# Patient Record
Sex: Female | Born: 1935 | Race: White | Hispanic: No | Smoking: Former smoker
Health system: Southern US, Community
[De-identification: ages and names within clinical notes are randomized; demographics above are authoritative.]

## PROBLEM LIST (undated history)

## (undated) DIAGNOSIS — K5790 Diverticulosis of intestine, part unspecified, without perforation or abscess without bleeding: Secondary | ICD-10-CM

## (undated) DIAGNOSIS — D369 Benign neoplasm, unspecified site: Secondary | ICD-10-CM

## (undated) DIAGNOSIS — Z1211 Encounter for screening for malignant neoplasm of colon: Secondary | ICD-10-CM

## (undated) DIAGNOSIS — K311 Adult hypertrophic pyloric stenosis: Secondary | ICD-10-CM

## (undated) DIAGNOSIS — I10 Essential (primary) hypertension: Secondary | ICD-10-CM

## (undated) DIAGNOSIS — C539 Malignant neoplasm of cervix uteri, unspecified: Secondary | ICD-10-CM

## (undated) DIAGNOSIS — I639 Cerebral infarction, unspecified: Secondary | ICD-10-CM

## (undated) DIAGNOSIS — M1990 Unspecified osteoarthritis, unspecified site: Secondary | ICD-10-CM

## (undated) DIAGNOSIS — M25473 Effusion, unspecified ankle: Secondary | ICD-10-CM

## (undated) DIAGNOSIS — R7989 Other specified abnormal findings of blood chemistry: Secondary | ICD-10-CM

## (undated) DIAGNOSIS — Z87440 Personal history of urinary (tract) infections: Secondary | ICD-10-CM

## (undated) DIAGNOSIS — R609 Edema, unspecified: Secondary | ICD-10-CM

## (undated) DIAGNOSIS — Z0389 Encounter for observation for other suspected diseases and conditions ruled out: Secondary | ICD-10-CM

## (undated) DIAGNOSIS — K219 Gastro-esophageal reflux disease without esophagitis: Secondary | ICD-10-CM

## (undated) DIAGNOSIS — H3530 Unspecified macular degeneration: Secondary | ICD-10-CM

## (undated) DIAGNOSIS — K279 Peptic ulcer, site unspecified, unspecified as acute or chronic, without hemorrhage or perforation: Secondary | ICD-10-CM

## (undated) DIAGNOSIS — Z90710 Acquired absence of both cervix and uterus: Secondary | ICD-10-CM

## (undated) DIAGNOSIS — M25476 Effusion, unspecified foot: Secondary | ICD-10-CM

## (undated) HISTORY — DX: Essential (primary) hypertension: I10

## (undated) HISTORY — DX: Edema, unspecified: R60.9

## (undated) HISTORY — DX: Encounter for screening for malignant neoplasm of colon: Z12.11

## (undated) HISTORY — DX: Unspecified osteoarthritis, unspecified site: M19.90

## (undated) HISTORY — DX: Unspecified macular degeneration: H35.30

## (undated) HISTORY — DX: Encounter for observation for other suspected diseases and conditions ruled out: Z03.89

## (undated) HISTORY — DX: Benign neoplasm, unspecified site: D36.9

## (undated) HISTORY — DX: Effusion, unspecified foot: M25.476

## (undated) HISTORY — DX: Cerebral infarction, unspecified: I63.9

## (undated) HISTORY — DX: Personal history of urinary (tract) infections: Z87.440

## (undated) HISTORY — DX: Peptic ulcer, site unspecified, unspecified as acute or chronic, without hemorrhage or perforation: K27.9

## (undated) HISTORY — DX: Effusion, unspecified ankle: M25.473

## (undated) HISTORY — PX: ABDOMINAL HYSTERECTOMY: SHX81

## (undated) HISTORY — DX: Malignant neoplasm of cervix uteri, unspecified: C53.9

## (undated) HISTORY — DX: Diverticulosis of intestine, part unspecified, without perforation or abscess without bleeding: K57.90

## (undated) HISTORY — DX: Acquired absence of both cervix and uterus: Z90.710

## (undated) HISTORY — DX: Adult hypertrophic pyloric stenosis: K31.1

## (undated) HISTORY — DX: Gastro-esophageal reflux disease without esophagitis: K21.9

## (undated) HISTORY — DX: Other specified abnormal findings of blood chemistry: R79.89

## (undated) HISTORY — PX: OTHER SURGICAL HISTORY: SHX169

---

## 1992-04-03 DIAGNOSIS — Q400 Congenital hypertrophic pyloric stenosis: Secondary | ICD-10-CM

## 1998-09-15 ENCOUNTER — Other Ambulatory Visit: Admission: RE | Admit: 1998-09-15 | Discharge: 1998-09-15 | Payer: Self-pay | Admitting: Orthopedic Surgery

## 1999-01-25 ENCOUNTER — Other Ambulatory Visit: Admission: RE | Admit: 1999-01-25 | Discharge: 1999-01-25 | Payer: Self-pay | Admitting: Orthopedic Surgery

## 2003-12-01 ENCOUNTER — Encounter (INDEPENDENT_AMBULATORY_CARE_PROVIDER_SITE_OTHER): Payer: Self-pay | Admitting: Gastroenterology

## 2004-12-19 ENCOUNTER — Ambulatory Visit: Payer: Self-pay | Admitting: Internal Medicine

## 2005-01-16 ENCOUNTER — Ambulatory Visit: Payer: Self-pay | Admitting: Internal Medicine

## 2005-01-23 ENCOUNTER — Ambulatory Visit: Payer: Self-pay | Admitting: Internal Medicine

## 2005-02-19 ENCOUNTER — Ambulatory Visit: Payer: Self-pay | Admitting: Internal Medicine

## 2005-09-09 ENCOUNTER — Ambulatory Visit: Payer: Self-pay | Admitting: Internal Medicine

## 2005-09-18 ENCOUNTER — Inpatient Hospital Stay (HOSPITAL_COMMUNITY): Admission: EM | Admit: 2005-09-18 | Discharge: 2005-09-20 | Payer: Self-pay | Admitting: Emergency Medicine

## 2005-09-19 ENCOUNTER — Ambulatory Visit: Payer: Self-pay | Admitting: Internal Medicine

## 2005-09-23 ENCOUNTER — Ambulatory Visit: Payer: Self-pay

## 2005-10-10 ENCOUNTER — Ambulatory Visit: Payer: Self-pay | Admitting: Internal Medicine

## 2006-01-13 ENCOUNTER — Ambulatory Visit: Payer: Self-pay | Admitting: Internal Medicine

## 2006-02-26 ENCOUNTER — Ambulatory Visit: Payer: Self-pay | Admitting: Internal Medicine

## 2006-03-11 HISTORY — PX: CATARACT EXTRACTION: SUR2

## 2006-03-20 ENCOUNTER — Ambulatory Visit (HOSPITAL_COMMUNITY): Admission: RE | Admit: 2006-03-20 | Discharge: 2006-03-20 | Payer: Self-pay | Admitting: Internal Medicine

## 2006-03-20 ENCOUNTER — Ambulatory Visit: Payer: Self-pay | Admitting: Internal Medicine

## 2006-04-22 ENCOUNTER — Ambulatory Visit: Payer: Self-pay | Admitting: Internal Medicine

## 2006-12-02 ENCOUNTER — Encounter: Payer: Self-pay | Admitting: *Deleted

## 2006-12-02 DIAGNOSIS — Z9079 Acquired absence of other genital organ(s): Secondary | ICD-10-CM | POA: Insufficient documentation

## 2006-12-02 DIAGNOSIS — K279 Peptic ulcer, site unspecified, unspecified as acute or chronic, without hemorrhage or perforation: Secondary | ICD-10-CM

## 2006-12-02 DIAGNOSIS — Z8679 Personal history of other diseases of the circulatory system: Secondary | ICD-10-CM | POA: Insufficient documentation

## 2006-12-02 DIAGNOSIS — I10 Essential (primary) hypertension: Secondary | ICD-10-CM | POA: Insufficient documentation

## 2006-12-02 DIAGNOSIS — K219 Gastro-esophageal reflux disease without esophagitis: Secondary | ICD-10-CM

## 2007-03-20 ENCOUNTER — Encounter: Payer: Self-pay | Admitting: Internal Medicine

## 2007-06-05 ENCOUNTER — Ambulatory Visit: Payer: Self-pay | Admitting: Internal Medicine

## 2007-06-05 DIAGNOSIS — R7989 Other specified abnormal findings of blood chemistry: Secondary | ICD-10-CM | POA: Insufficient documentation

## 2007-06-08 ENCOUNTER — Encounter: Payer: Self-pay | Admitting: Internal Medicine

## 2007-10-23 ENCOUNTER — Telehealth: Payer: Self-pay | Admitting: Internal Medicine

## 2007-11-09 ENCOUNTER — Encounter: Payer: Self-pay | Admitting: Internal Medicine

## 2007-11-24 ENCOUNTER — Telehealth: Payer: Self-pay | Admitting: Internal Medicine

## 2007-12-10 ENCOUNTER — Telehealth: Payer: Self-pay | Admitting: Internal Medicine

## 2008-06-07 ENCOUNTER — Telehealth: Payer: Self-pay | Admitting: Internal Medicine

## 2008-07-05 ENCOUNTER — Encounter: Payer: Self-pay | Admitting: Internal Medicine

## 2008-08-04 ENCOUNTER — Ambulatory Visit: Payer: Self-pay | Admitting: Internal Medicine

## 2008-08-04 DIAGNOSIS — M25473 Effusion, unspecified ankle: Secondary | ICD-10-CM | POA: Insufficient documentation

## 2008-08-04 DIAGNOSIS — R609 Edema, unspecified: Secondary | ICD-10-CM | POA: Insufficient documentation

## 2008-08-04 DIAGNOSIS — M25476 Effusion, unspecified foot: Secondary | ICD-10-CM

## 2008-08-04 DIAGNOSIS — N390 Urinary tract infection, site not specified: Secondary | ICD-10-CM

## 2008-08-04 LAB — CONVERTED CEMR LAB
ALT: 17 units/L (ref 0–35)
AST: 19 units/L (ref 0–37)
Basophils Absolute: 0 10*3/uL (ref 0.0–0.1)
Bilirubin Urine: NEGATIVE
Calcium: 9.1 mg/dL (ref 8.4–10.5)
Creatinine, Ser: 0.8 mg/dL (ref 0.4–1.2)
Eosinophils Absolute: 0.2 10*3/uL (ref 0.0–0.7)
Eosinophils Relative: 2.8 % (ref 0.0–5.0)
GFR calc non Af Amer: 72.73 mL/min (ref >60)
Lymphocytes Relative: 23.8 % (ref 12.0–46.0)
MCHC: 34.5 g/dL (ref 30.0–36.0)
Neutrophils Relative %: 64.7 % (ref 43.0–77.0)
Platelets: 313 10*3/uL (ref 150.0–400.0)
Potassium: 4.1 meq/L (ref 3.5–5.1)
RDW: 12.1 % (ref 11.5–14.6)
Specific Gravity, Urine: 1.015 (ref 1.000–1.030)
Total Protein: 7.8 g/dL (ref 6.0–8.3)
Urobilinogen, UA: 0.2 (ref 0.0–1.0)
WBC: 8.4 10*3/uL (ref 4.5–10.5)
pH: 6 (ref 5.0–8.0)

## 2008-08-05 ENCOUNTER — Encounter: Payer: Self-pay | Admitting: Internal Medicine

## 2008-08-05 ENCOUNTER — Telehealth: Payer: Self-pay | Admitting: Internal Medicine

## 2008-08-11 ENCOUNTER — Encounter: Admission: RE | Admit: 2008-08-11 | Discharge: 2008-09-13 | Payer: Self-pay | Admitting: Internal Medicine

## 2008-09-13 ENCOUNTER — Ambulatory Visit: Payer: Self-pay | Admitting: Internal Medicine

## 2008-10-31 ENCOUNTER — Encounter (INDEPENDENT_AMBULATORY_CARE_PROVIDER_SITE_OTHER): Payer: Self-pay | Admitting: *Deleted

## 2008-12-05 DIAGNOSIS — Z860100 Personal history of colon polyps, unspecified: Secondary | ICD-10-CM | POA: Insufficient documentation

## 2008-12-06 ENCOUNTER — Ambulatory Visit: Payer: Self-pay | Admitting: Gastroenterology

## 2008-12-12 ENCOUNTER — Telehealth: Payer: Self-pay | Admitting: Internal Medicine

## 2008-12-16 ENCOUNTER — Ambulatory Visit: Payer: Self-pay | Admitting: Gastroenterology

## 2008-12-16 ENCOUNTER — Encounter: Payer: Self-pay | Admitting: Gastroenterology

## 2008-12-20 ENCOUNTER — Encounter: Payer: Self-pay | Admitting: Gastroenterology

## 2009-07-18 ENCOUNTER — Telehealth: Payer: Self-pay | Admitting: Internal Medicine

## 2009-10-06 ENCOUNTER — Telehealth: Payer: Self-pay | Admitting: Internal Medicine

## 2010-02-19 ENCOUNTER — Telehealth: Payer: Self-pay | Admitting: Internal Medicine

## 2010-03-20 ENCOUNTER — Telehealth: Payer: Self-pay | Admitting: Internal Medicine

## 2010-03-21 ENCOUNTER — Ambulatory Visit
Admission: RE | Admit: 2010-03-21 | Discharge: 2010-03-21 | Payer: Self-pay | Source: Home / Self Care | Attending: Internal Medicine | Admitting: Internal Medicine

## 2010-04-08 LAB — CONVERTED CEMR LAB
Basophils Absolute: 0 10*3/uL (ref 0.0–0.1)
CO2: 32 meq/L (ref 19–32)
Calcium: 9.3 mg/dL (ref 8.4–10.5)
Direct LDL: 113.8 mg/dL
Eosinophils Absolute: 0.2 10*3/uL (ref 0.0–0.7)
Eosinophils Relative: 2.7 % (ref 0.0–5.0)
GFR calc Af Amer: 68 mL/min
HCT: 42.3 % (ref 36.0–46.0)
Lymphocytes Relative: 29 % (ref 12.0–46.0)
MCHC: 33.2 g/dL (ref 30.0–36.0)
MCV: 89.6 fL (ref 78.0–100.0)
Monocytes Absolute: 0.6 10*3/uL (ref 0.1–1.0)
Monocytes Relative: 8.9 % (ref 3.0–12.0)
Neutro Abs: 3.7 10*3/uL (ref 1.4–7.7)
Potassium: 4.9 meq/L (ref 3.5–5.1)
Total CHOL/HDL Ratio: 2.9
Triglycerides: 116 mg/dL (ref 0–149)
WBC: 6.3 10*3/uL (ref 4.5–10.5)

## 2010-04-12 NOTE — Assessment & Plan Note (Signed)
Summary: pt has c/o dizziness with ears feeling clogged   Vital Signs:  Patient profile:   77 year old female Height:      66 inches Weight:      177 pounds BMI:     28.67 Temp:     98.2 degrees F oral Pulse rate:   64 / minute BP sitting:   148 / 82  (left arm) Cuff size:   regular  Vitals Entered By: Lamar Sprinkles, CMA (March 21, 2010 9:32 AM) CC: dizzyness - room spinning 2 nights ago when she went to bed, somewhat better yesterday, ? ears need to be cleaned out/SD   Primary Care Provider:  Jacques Navy MD  CC:  dizzyness - room spinning 2 nights ago when she went to bed, somewhat better yesterday, and ? ears need to be cleaned out/SD.  History of Present Illness: Had an episode of dizziness when she went to bed the last two nights. She could not gether eyes to focus and get the room spinning to stop. Jhonnie Garner she got to go to the bathroom - she felt off balance. The sympotms have improved. She did have a dull headache. She is concerned that she may have an increase in cerumen.   In mid-December she had a 24 GI illness: felt queasy and then had severe nausea followed by dry heaves and then diarrhea. She continued to have N/V all day and all night and then eased off Monday. Last week she did have a little trouble after something she ate.   She denies chest pain, no increased SOB,  no focal weakness, no paresthesia, no cognitive change or speech trouble, no swallowing problem. She is having increased pain in her knees.   Current Medications (verified): 1)  Dulcolax Stool Softener 100 Mg  Caps (Docusate Sodium) .... Take 2 Tab By Mouth At Bedtime 2)  Centrum Silver   Tabs (Multiple Vitamins-Minerals) .... Take One Tablet Once Daily 3)  Dilt-Cd 180 Mg Xr24h-Cap (Diltiazem Hcl Coated Beads) .Marland Kitchen.. 1 By Mouth Once Daily 4)  Plavix 75 Mg  Tabs (Clopidogrel Bisulfate) .... Take One Tablet Once Daily 5)  Aspirin 81 Mg  Tabs (Aspirin) .... Take One Tablet Once Daily 6)  Prilosec Otc 20 Mg   Tbec (Omeprazole Magnesium) .... Take One Tablet Once Daily 7)  Aleve 220 Mg Tabs (Naproxen Sodium) .... As Needed Knee Pain  Allergies (verified): No Known Drug Allergies  Past History:  Past Medical History: Last updated: 12/05/2008 COLONIC POLYPS, ADENOMATOUS, HX OF (ICD-V12.72) PYLORIC STENOSIS (ICD-750.5) COLONIC POLYPS, ADENOMATOUS (ICD-211.3) UTI (ICD-599.0) EDEMA (ICD-782.3) EFFUSION OF ANKLE AND FOOT JOINT (ICD-719.07) SPECIAL SCREENING FOR MALIGNANT NEOPLASMS COLON (ICD-V76.51) OBSERVATION FOR SUSPECTED CARDIOVASCULAR DISEASE (ICD-V71.7) OTHER ABNORMAL BLOOD CHEMISTRY (ICD-790.6) * CYST ON RIGHT HAND REMOVED CEREBROVASCULAR ACCIDENT, HX OF (ICD-V12.50) TOTAL ABDOMINAL HYSTERECTOMY, HX OF (ICD-V45.77) HYPERTENSION (ICD-401.9) PEPTIC ULCER DISEASE (ICD-533.90) GERD (ICD-530.81)  Past Surgical History: Last updated: 06/05/2007 * CYST ON RIGHT HAND REMOVED TOTAL ABDOMINAL HYSTERECTOMY, HX OF (ICD-V45.77)-cervical cancer G0P0 Cataract extraction '08 left with IOL Dagoberto Ligas) FH reviewed for relevance, SH/Risk Factors reviewed for relevance  Review of Systems  The patient denies anorexia, fever, weight loss, weight gain, chest pain, syncope, dyspnea on exertion, prolonged cough, abdominal pain, severe indigestion/heartburn, suspicious skin lesions, depression, enlarged lymph nodes, and angioedema.    Physical Exam  General:  alert, well-developed, well-nourished, and well-hydrated.   Head:  normocephalic and atraumatic.   Eyes:  pupils equal and pupils round.   Ears:  cerumen  bilaterally - irrigated and using a soft curette removed blocks of cerumen from each ear.  Neck:  supple and no carotid bruits.   Chest Wall:  no deformities.   Lungs:  normal respiratory effort, normal breath sounds, no crackles, and no wheezes.   Heart:  normal rate, regular rhythm, no murmur, and no JVD.   Msk:  normal ROM, no joint tenderness, and no joint swelling.   Pulses:  2+ RADIAL   Extremities:  No clubbing, cyanosis, edema, or deformity noted with normal full range of motion of all joints.   Neurologic:  alert & oriented X3, cranial nerves II-XII intact, strength normal in all extremities, gait normal, DTRs symmetrical and normal, finger-to-nose normal, and heel-to-shin normal.  Normal rapid finger movement Skin:  turgor normal, color normal, no rashes, and no suspicious lesions.   Cervical Nodes:  no anterior cervical adenopathy and no posterior cervical adenopathy.   Psych:  Oriented X3, memory intact for recent and remote, normally interactive, and not anxious appearing.     Impression & Recommendations:  Problem # 1:  LABYRINTHITIS, ACUTE (ICD-386.30) Patient with acute labyrinthitis based on history and negative neuro exam. Full explanation provided.  Plan - bonine (otc) 1/2 tab q 6 as needed            for worsening symptoms - return for repeat examination.   Problem # 2:  CERUMEN IMPACTION, BILATERAL (ICD-380.4)  patient with bilateral impactions that required irrigation and curettage. Good results. TMs afterwards were normal.   Orders: Cerumen Impaction Removal ([address])  Complete Medication List: 1)  Dulcolax Stool Softener 100 Mg Caps (Docusate sodium) .... Take 2 tab by mouth at bedtime 2)  Centrum Silver Tabs (Multiple vitamins-minerals) .... Take one tablet once daily 3)  Dilt-cd 180 Mg Xr24h-cap (Diltiazem hcl coated beads) .Marland Kitchen.. 1 by mouth once daily 4)  Plavix 75 Mg Tabs (Clopidogrel bisulfate) .... Take one tablet once daily 5)  Aspirin 81 Mg Tabs (Aspirin) .... Take one tablet once daily 6)  Prilosec Otc 20 Mg Tbec (Omeprazole magnesium) .... Take one tablet once daily 7)  Aleve 220 Mg Tabs (Naproxen sodium) .... As needed knee pain 8)  Smz-tmp Ds 800-160 Mg Tabs (Sulfamethoxazole-trimethoprim) .Marland Kitchen.. 1 by mouth two times a day x 5 days for cystitis  Patient Instructions: 1)  Dizziness - a normal neurologic exam. suspect labyrinthitis - "inner  ear" as cause. For continued symptoms use 1/2 tablet of Bonine every 6 hrs. 2)  Ear was - use viscous peroxide (cerumex, debrox or generic ear wax removal aid) in each ear, let dwell for 10 minutes then irrigate with warm water using a bulb syringe. do this once a week.  3)  GI- 24 hr bug without any long lasting side effects.  Prescriptions: SMZ-TMP DS 800-160 MG TABS (SULFAMETHOXAZOLE-TRIMETHOPRIM) 1 by mouth two times a day x 5 days for cystitis  #10 x 0   Entered and Authorized by:   Jacques Navy MD   Signed by:   Jacques Navy MD on 03/21/2010   Method used:   Electronically to        [facility]* (retail)       [address] [place], [place]  [address]       Ph: [phone] or [phone]       Fax: [phone]   RxID:   [id number]  Orders Added: 1)  Est. Patient Level III [[address]] 2)  Cerumen Impaction Removal [[address]]

## 2010-04-12 NOTE — Progress Notes (Signed)
  Phone Note Refill Request Message from:  Fax from Pharmacy  Refills Requested: Medication #1:  PLAVIX 75 MG  TABS Take one tablet once daily Initial call taken by: Ami Bullins CMA,  February 19, 2010 2:25 PM    Prescriptions: PLAVIX 75 MG  TABS (CLOPIDOGREL BISULFATE) Take one tablet once daily  #30 x 6   Entered by:   Ami Bullins CMA   Authorized by:   Jacques Navy MD   Signed by:   Bill Salinas CMA on 02/19/2010   Method used:   Electronically to        [facility]* (retail)       [address]       [place], [place]  [id number]       Ph: [phone]       Fax: [phone]   RxID:   [id number]

## 2010-04-12 NOTE — Progress Notes (Signed)
Summary: WORK IN?   Phone Note Call from Patient Call back at [facility] Phone [phone] Call back at [phone]   Summary of Call: Pt c/o dizzyness starting late last night. Also c/o dull h/a and feels that her ears may need to be cleaned out. She would like office visit this afternoon w/Dr Kevonta Phariss if possible, or if MD feels she should wait till tomorrow? Both schedules are full, please advise.  Initial call taken by: Lamar Sprinkles, CMA,  March 20, 2010 11:29 AM  Follow-up for Phone Call        ok to work in  Follow-up by: Jacques Navy MD,  March 20, 2010 12:05 PM  Additional Follow-up for Phone Call Additional follow up Details #1::        pt sch tomorow morning at 9am Additional Follow-up by: Ami Bullins CMA,  March 20, 2010 1:15 PM

## 2010-04-12 NOTE — Progress Notes (Signed)
  Phone Note Refill Request Message from:  Fax from Pharmacy on October 06, 2009 11:47 AM  Refills Requested: Medication #1:  DILT-CD 180 MG XR24H-CAP 1 by mouth once daily Initial call taken by: Ami Bullins CMA,  October 06, 2009 11:47 AM    Prescriptions: DILT-CD 180 MG XR24H-CAP (DILTIAZEM HCL COATED BEADS) 1 by mouth once daily  #30 x 4   Entered by:   Ami Bullins CMA   Authorized by:   Jacques Navy MD   Signed by:   Bill Salinas CMA on 10/06/2009   Method used:   Electronically to        [facility]* (retail)       [address]       [place], [place]  [id number]       Ph: [phone]       Fax: [phone]   RxID:   [id number]

## 2010-04-12 NOTE — Progress Notes (Signed)
  Phone Note Refill Request Message from:  Fax from Pharmacy on Jul 18, 2009 2:57 PM  Refills Requested: Medication #1:  PLAVIX 75 MG  TABS Take one tablet once daily Initial call taken by: Ami Bullins CMA,  Jul 18, 2009 2:57 PM    Prescriptions: PLAVIX 75 MG  TABS (CLOPIDOGREL BISULFATE) Take one tablet once daily  #30 x 6   Entered by:   Ami Bullins CMA   Authorized by:   Jacques Navy MD   Signed by:   Bill Salinas CMA on 07/18/2009   Method used:   Electronically to        [facility]* (retail)       [address]       [place], [place]  [id number]       Ph: [phone]       Fax: [phone]   RxID:   [id number]

## 2010-06-02 ENCOUNTER — Emergency Department (INDEPENDENT_AMBULATORY_CARE_PROVIDER_SITE_OTHER): Payer: Medicare Other

## 2010-06-02 ENCOUNTER — Emergency Department (HOSPITAL_BASED_OUTPATIENT_CLINIC_OR_DEPARTMENT_OTHER)
Admission: EM | Admit: 2010-06-02 | Discharge: 2010-06-02 | Disposition: A | Discharge status: Home / Self Care | Payer: Medicare Other | Attending: Emergency Medicine | Admitting: Emergency Medicine

## 2010-06-02 DIAGNOSIS — R51 Headache: Secondary | ICD-10-CM | POA: Insufficient documentation

## 2010-06-02 DIAGNOSIS — I10 Essential (primary) hypertension: Secondary | ICD-10-CM | POA: Insufficient documentation

## 2010-06-02 DIAGNOSIS — G319 Degenerative disease of nervous system, unspecified: Secondary | ICD-10-CM | POA: Insufficient documentation

## 2010-06-02 DIAGNOSIS — Z8679 Personal history of other diseases of the circulatory system: Secondary | ICD-10-CM | POA: Insufficient documentation

## 2010-06-02 DIAGNOSIS — R112 Nausea with vomiting, unspecified: Secondary | ICD-10-CM

## 2010-06-02 LAB — CBC
Hemoglobin: 15.4 g/dL — ABNORMAL HIGH (ref 12.0–15.0)
MCH: 30.1 pg (ref 26.0–34.0)
MCHC: 36.3 g/dL — ABNORMAL HIGH (ref 30.0–36.0)
Platelets: 353 10*3/uL (ref 150–400)
WBC: 9.5 10*3/uL (ref 4.0–10.5)

## 2010-06-02 LAB — BASIC METABOLIC PANEL
BUN: 15 mg/dL (ref 6–23)
Chloride: 99 mEq/L (ref 96–112)
Creatinine, Ser: 0.7 mg/dL (ref 0.4–1.2)
GFR calc Af Amer: >60 mL/min (ref >60)
Potassium: 3.6 mEq/L (ref 3.5–5.1)
Sodium: 137 mEq/L (ref 135–145)

## 2010-06-02 LAB — DIFFERENTIAL
Basophils Absolute: 0 10*3/uL (ref 0.0–0.1)
Lymphocytes Relative: 11 % — ABNORMAL LOW (ref 12–46)
Lymphs Abs: 1 10*3/uL (ref 0.7–4.0)
Monocytes Absolute: 0.3 10*3/uL (ref 0.1–1.0)
Neutro Abs: 8.1 10*3/uL — ABNORMAL HIGH (ref 1.7–7.7)

## 2010-06-04 ENCOUNTER — Emergency Department (HOSPITAL_BASED_OUTPATIENT_CLINIC_OR_DEPARTMENT_OTHER)
Admission: EM | Admit: 2010-06-04 | Discharge: 2010-06-04 | Disposition: A | Discharge status: Home / Self Care | Payer: Medicare Other | Source: Home / Self Care | Attending: Emergency Medicine | Admitting: Emergency Medicine

## 2010-06-04 ENCOUNTER — Telehealth: Payer: Self-pay | Admitting: *Deleted

## 2010-06-04 DIAGNOSIS — Z79899 Other long term (current) drug therapy: Secondary | ICD-10-CM | POA: Insufficient documentation

## 2010-06-04 DIAGNOSIS — I10 Essential (primary) hypertension: Secondary | ICD-10-CM | POA: Insufficient documentation

## 2010-06-04 DIAGNOSIS — R51 Headache: Secondary | ICD-10-CM | POA: Insufficient documentation

## 2010-06-04 DIAGNOSIS — Z8679 Personal history of other diseases of the circulatory system: Secondary | ICD-10-CM | POA: Insufficient documentation

## 2010-06-04 LAB — BASIC METABOLIC PANEL
BUN: 14 mg/dL (ref 6–23)
CO2: 25 mEq/L (ref 19–32)
Creatinine, Ser: 0.7 mg/dL (ref 0.4–1.2)
Potassium: 3.3 mEq/L — ABNORMAL LOW (ref 3.5–5.1)

## 2010-06-04 LAB — URINALYSIS, ROUTINE W REFLEX MICROSCOPIC
Glucose, UA: NEGATIVE mg/dL
Protein, ur: 30 mg/dL — AB
Specific Gravity, Urine: 1.016 (ref 1.005–1.030)
pH: 7 (ref 5.0–8.0)

## 2010-06-04 NOTE — Telephone Encounter (Signed)
Received [facility]. Pt has been seen and evaluated for h/a. They feel it may be appropriate to have MRI but do not have any neuro symptoms to support this. They strongly advise f/u tomorrow by Dr Debby Bud. Please advise - do you want to see pt tomorrow?

## 2010-06-04 NOTE — Telephone Encounter (Signed)
Patient informed - Apt scheduled for 06/05/10 at 2:15

## 2010-06-04 NOTE — Telephone Encounter (Signed)
OV is most appropriate

## 2010-06-05 ENCOUNTER — Ambulatory Visit (INDEPENDENT_AMBULATORY_CARE_PROVIDER_SITE_OTHER): Payer: Medicare Other | Admitting: Internal Medicine

## 2010-06-05 ENCOUNTER — Encounter: Payer: Self-pay | Admitting: Internal Medicine

## 2010-06-05 VITALS — BP 152/70 | HR 77 | Temp 98.4°F | Wt 174.0 lb

## 2010-06-05 DIAGNOSIS — R51 Headache: Secondary | ICD-10-CM

## 2010-06-05 NOTE — Progress Notes (Signed)
Subjective:    Patient ID: [name], female    DOB: September 18, 1922, 77 y.o.   MRN: [id number]  HPI [name] presents due to severe headache with a vertex location that was associated with N/V. She was seen x 2 at [facility] ED on [address]. She does report that she was found to be hypokalemic and that after replacement she has felt a lot better. Reviewed ED MD notes for 3/24 and 3/26: normal CT brain noted. Bmet 3/26 with K 3.3-subsequently replaced with 40 meq of K. CBC normal but slight left shift with 86% segs. U/A with -2 WBC, some bacteria. BP 3/26 was quite elevated at 190/83. She has been eating better and has no neurologic symptoms. NO indication for MRI brain.   Past Medical History  Diagnosis Date  . Adenomatous polyps   . Pyloric stenosis   . History of UTI   . Edema   . Effusion of ankle and foot joint   . Special screening for malignant neoplasm of colon   . Observation for suspected cardiovascular disease   . Other abnormal blood chemistry   . Cerebrovascular accident   . S/P total abdominal hysterectomy   . Hypertension   . Peptic ulcer disease   . GERD (gastroesophageal reflux disease)    Past Surgical History  Procedure Date  . Cyst on right hand removed   . Cataract extraction 2008    Dr Dagoberto Ligas   Family History  Problem Relation Age of Onset  . Heart failure Mother   . Hypertension Mother   . Coronary artery disease Mother    History   Social History  . Marital Status: Widowed    Spouse Name: N/A    Number of Children: N/A  . Years of Education: N/A   Occupational History  . Not on file.   Social History Main Topics  . Smoking status: Former Smoker    Quit date: 03/11/1948  . Smokeless tobacco: Never Used  . Alcohol Use: Not on file  . Drug Use: Not on file  . Sexually Active: Not on file   Other Topics Concern  . Not on file   Social History Narrative   HSG, [employer]GMarried '50- widowed after 57 yearsLives alone in own home. On the list  FHWEnd of life: No CPR, no heroic/futile treatmentPatient has never smokedAlcohol use-noDaily Caffine use-OCCIllicit Drug Use-no    Review of Systems  Constitutional: Negative.  Negative for fever, chills, activity change and unexpected weight change.  HENT: Negative.  Negative for hearing loss, ear pain, congestion, neck stiffness and postnasal drip.   Eyes: Negative.  Negative for pain, discharge and visual disturbance.  Respiratory: Negative.  Negative for chest tightness and wheezing.   Cardiovascular: Negative.  Negative for chest pain and palpitations.       [No decreased exercise tolerance Gastrointestinal: Negative.        [No change in bowel habit. No bloating or gas. No reflux or indigestion Genitourinary: Negative.  Negative for urgency, frequency, flank pain and difficulty urinating.  Musculoskeletal: Negative.  Negative for myalgias, back pain, arthralgias and gait problem.  Neurological: Negative.  Negative for dizziness, tremors, weakness and headaches.  Hematological: Negative.  Negative for adenopathy.  Psychiatric/Behavioral: Negative for behavioral problems and dysphoric mood.  [all other systems reviewed and are negative   Review of Systems     Objective:   Physical Exam  Constitutional: She is oriented to person, place, and time. She appears well-developed and well-nourished.  HENT:  Head: Normocephalic and atraumatic.  Eyes: Conjunctivae and EOM are normal. Pupils are equal, round, and reactive to light.  Neck: Normal range of motion. Neck supple.  Cardiovascular: Normal rate and regular rhythm.   Pulmonary/Chest: Effort normal.  Neurological: She is alert and oriented to person, place, and time. She has normal reflexes. No cranial nerve deficit. Coordination normal.  Skin: Skin is warm and dry.          Assessment & Plan:  1. Headache - severe: reviewed all ED information. Her symptoms have now resolved. NO need for further work-up.  Plan - closer  attn to diet.

## 2010-06-07 ENCOUNTER — Telehealth: Payer: Self-pay | Admitting: *Deleted

## 2010-06-07 ENCOUNTER — Inpatient Hospital Stay (HOSPITAL_COMMUNITY)
Admission: EM | Admit: 2010-06-07 | Discharge: 2010-06-11 | DRG: 392 | Disposition: A | Discharge status: Home / Self Care | Payer: Medicare Other | Attending: Internal Medicine | Admitting: Internal Medicine

## 2010-06-07 ENCOUNTER — Emergency Department (HOSPITAL_COMMUNITY): Payer: Medicare Other

## 2010-06-07 DIAGNOSIS — R112 Nausea with vomiting, unspecified: Secondary | ICD-10-CM | POA: Diagnosis present

## 2010-06-07 DIAGNOSIS — K219 Gastro-esophageal reflux disease without esophagitis: Secondary | ICD-10-CM | POA: Diagnosis present

## 2010-06-07 DIAGNOSIS — K2900 Acute gastritis without bleeding: Principal | ICD-10-CM | POA: Diagnosis present

## 2010-06-07 DIAGNOSIS — G459 Transient cerebral ischemic attack, unspecified: Secondary | ICD-10-CM | POA: Diagnosis not present

## 2010-06-07 DIAGNOSIS — E876 Hypokalemia: Secondary | ICD-10-CM | POA: Diagnosis present

## 2010-06-07 DIAGNOSIS — Z8541 Personal history of malignant neoplasm of cervix uteri: Secondary | ICD-10-CM

## 2010-06-07 DIAGNOSIS — R918 Other nonspecific abnormal finding of lung field: Secondary | ICD-10-CM | POA: Diagnosis present

## 2010-06-07 DIAGNOSIS — R4701 Aphasia: Secondary | ICD-10-CM | POA: Diagnosis not present

## 2010-06-07 DIAGNOSIS — I10 Essential (primary) hypertension: Secondary | ICD-10-CM | POA: Diagnosis present

## 2010-06-07 DIAGNOSIS — Z8673 Personal history of transient ischemic attack (TIA), and cerebral infarction without residual deficits: Secondary | ICD-10-CM

## 2010-06-07 LAB — URINALYSIS, ROUTINE W REFLEX MICROSCOPIC
Ketones, ur: NEGATIVE mg/dL
Leukocytes, UA: NEGATIVE
Nitrite: NEGATIVE
Protein, ur: 30 mg/dL — AB
Urobilinogen, UA: 0.2 mg/dL (ref 0.0–1.0)

## 2010-06-07 LAB — BASIC METABOLIC PANEL
CO2: 25 mEq/L (ref 19–32)
Chloride: 97 mEq/L (ref 96–112)
Creatinine, Ser: 0.81 mg/dL (ref 0.4–1.2)
GFR calc Af Amer: >60 mL/min (ref >60)
GFR calc non Af Amer: >60 mL/min (ref >60)
Glucose, Bld: 127 mg/dL — ABNORMAL HIGH (ref 70–99)
Potassium: 3.7 mEq/L (ref 3.5–5.1)
Sodium: 133 mEq/L — ABNORMAL LOW (ref 135–145)

## 2010-06-07 LAB — CBC
Hemoglobin: 15.8 g/dL — ABNORMAL HIGH (ref 12.0–15.0)
MCH: 30.2 pg (ref 26.0–34.0)
MCHC: 34.5 g/dL (ref 30.0–36.0)
RBC: 5.23 MIL/uL — ABNORMAL HIGH (ref 3.87–5.11)
RDW: 12.2 % (ref 11.5–15.5)
WBC: 12.2 10*3/uL — ABNORMAL HIGH (ref 4.0–10.5)

## 2010-06-07 LAB — URINE MICROSCOPIC-ADD ON

## 2010-06-07 NOTE — Telephone Encounter (Signed)
Called neighbors phone number - no answer. Called Mrs. Kavanagh's home number - no answer, left msg.  Will call in am.

## 2010-06-07 NOTE — Telephone Encounter (Signed)
Pt was seen in ER x 2 - She left vm that she was not feeling well again and wanted a call from Dr Debby Bud. Neighbor then left vm that Pt was vomiting, has swooshing sound in her ears and neck stiffness. I left pt & her neighbor a mess to call office back for more info.

## 2010-06-08 ENCOUNTER — Inpatient Hospital Stay (HOSPITAL_COMMUNITY): Payer: Medicare Other

## 2010-06-08 LAB — CBC
HCT: 41.4 % (ref 36.0–46.0)
Hemoglobin: 14.1 g/dL (ref 12.0–15.0)
MCV: 88.5 fL (ref 78.0–100.0)
RDW: 12.5 % (ref 11.5–15.5)
WBC: 9.9 10*3/uL (ref 4.0–10.5)

## 2010-06-08 LAB — BASIC METABOLIC PANEL
BUN: 10 mg/dL (ref 6–23)
CO2: 27 mEq/L (ref 19–32)
Chloride: 100 mEq/L (ref 96–112)
GFR calc non Af Amer: >60 mL/min (ref >60)
Glucose, Bld: 100 mg/dL — ABNORMAL HIGH (ref 70–99)
Potassium: 3.4 mEq/L — ABNORMAL LOW (ref 3.5–5.1)

## 2010-06-08 NOTE — Telephone Encounter (Signed)
Pt is in-pt now

## 2010-06-08 NOTE — H&P (Signed)
[name], [name]                   ACCOUNT NO.:  [id number]  MEDICAL RECORD NO.:  [id number]           PATIENT TYPE:  E  LOCATION:  [facility]LED                         FACILITY[facility]  [facility]  PHYSICIAN:  Isidor Holts, M.D.  DATE OF BIRTH:  03-10-1923  DATE OF ADMISSION:  06/07/2010 DATE OF DISCHARGE:                             HISTORY & PHYSICAL   PRIMARY CARE PHYSICIAN:  Rosalyn Gess. Norins, MD  CHIEF COMPLAINT:  Headache, nausea, vomiting, and fatigue for the past 1 week.  HISTORY OF PRESENT ILLNESS:  This is an 77 year old female. For past medical history, see below.  According to the patient, she has had queasiness, recurrent vomiting, and progressive fatigue for the past 1 week.  Denies any fever or chills.  Denies history of sick contacts. Denies abdominal pain or diarrhea.  She has no recent history of ingestion of unusual foods and was in the emergency department at [facility] on June 02, 2010, and June 04, 2010, for the same symptoms.  Shewas given intravenous fluids and some medications without improvement, as matter of fact, vomited about 5 to 6 times today, and her neighbor kindly brought her to the emergency department.  PAST MEDICAL HISTORY: 1. Hypertension. 2. Depression. 3. GERD. 4. History of bleeding peptic ulcer, approximately 15 years ago. 5. Status post acute right posterior frontal CVA, July 2007. 6. Osteoarthritis of the knees. 7. History of cervical cancer in 1985, status post hysterectomy.  ALLERGIES:  Regular ASPIRIN causes GI upset.  MEDICATIONS: 1. Prilosec 20 mg p.o. daily. 2. Plavix 75 mg p.o. daily. 3. Ecotrin 81 mg p.o. daily. 4. Centrum Silver 1 p.o. daily. 5. Cartia XT 180 mg p.o. daily. 6. Dulcolax p.r.n. 7. Aleve p.r.n.  REVIEW OF SYSTEMS:  As per HPI and chief complaint.  The patient denies chest pain, palpitation, shortness of breath, cough, abdominal pain, or diarrhea.  Rest of systems review is negative.  SOCIAL HISTORY:  The patient  is retired, she used to work as a Special educational needs teacher.  She is widowed approximately 4 years now, currently lives alone, has no offspring.  Nonsmoker, but drinks an occasional glass of wine.  FAMILY HISTORY:  Mother is deceased at age 47 years, she was hypertensive.  The patient's father died at 75 years of congestive heart failure.  PHYSICAL EXAMINATION:  VITAL SIGNS:  Temperature 98.0, pulse 63 per minute regular, respiratory rate 16, BP 162/66 mmHg, pulse oximeter 99% on room air. GENERAL:  The patient did not appear to be in obvious acute distress at time of this evaluation, alert, communicative, not short of breath at rest. HEENT:  No clinical pallor.  No jaundice.  No conjunctival injection. Visible mucous membranes appear "dry." NECK:  Supple.  JVP not seen.  No palpable lymphadenopathy.  No palpable goiter. CHEST:  Clinically clear to auscultation.  No wheezes.  No crackles. HEART:  Sounds are heard normal and regular.  No murmurs. ABDOMEN:  Full, soft, nontender.  No palpable organomegaly.  No palpable masses.  Normal bowel sounds.LOWER EXTREMITY EXAMINATION:  No pitting edema.  Palpable peripheral pulses. MUSCULOSKELETAL SYSTEM:  Generalized osteoarthritic changes.  CENTRAL NERVOUS SYSTEM:  No focal neurologic deficit on gross examination.  INVESTIGATIONS:  CBC; WBC 12.2, hemoglobin 15.8, hematocrit 45.8, platelets 373,000.  ESR 16.  Electrolytes; sodium 133, potassium 3.7, chloride 97, CO2 25, BUN 14, creatinine 0.8, and glucose 127. Urinalysis negative.  Brain MRI, June 07, 2010, shows atrophy and small vessel disease.  No acute findings.  ASSESSMENT AND PLAN: 1. Acute gastritis, likely viral in etiology.  We shall admit the     patient, institute bowel rest.  Manage with intravenous fluids, antiemetics,     proton pump inhibitors.  For completeness, we shall check serum     lipase levels.  2. Mild dehydration, secondary to acute gastritis.  We shall manage     with  intravenous fluids.  Follow electrolytes and replete as indicated.  3. History of gastroesophageal reflux disease.  We shall manage with proton     pump inhibitor.  4. Hypertension.  This is uncontrolled at the present time.  We shall     place the patient on preadmission antihypertensives, as well as     p.r.n. antihypertensives.    Further management will depend on clinical course.     Isidor Holts, M.D.     CO/MEDQ  D:  06/07/2010  T:  06/07/2010  Job:  [id number]  Electronically Signed by Isidor Holts M.D. on 06/08/2010 01:29:35 PM

## 2010-06-09 DIAGNOSIS — K219 Gastro-esophageal reflux disease without esophagitis: Secondary | ICD-10-CM

## 2010-06-09 DIAGNOSIS — I10 Essential (primary) hypertension: Secondary | ICD-10-CM

## 2010-06-09 DIAGNOSIS — E860 Dehydration: Secondary | ICD-10-CM

## 2010-06-09 DIAGNOSIS — K5289 Other specified noninfective gastroenteritis and colitis: Secondary | ICD-10-CM

## 2010-06-09 LAB — COMPREHENSIVE METABOLIC PANEL
Alkaline Phosphatase: 77 U/L (ref 39–117)
BUN: 7 mg/dL (ref 6–23)
CO2: 26 mEq/L (ref 19–32)
Chloride: 104 mEq/L (ref 96–112)
Creatinine, Ser: 0.74 mg/dL (ref 0.4–1.2)
GFR calc non Af Amer: >60 mL/min (ref >60)
Glucose, Bld: 102 mg/dL — ABNORMAL HIGH (ref 70–99)
Potassium: 4 mEq/L (ref 3.5–5.1)
Total Bilirubin: 0.6 mg/dL (ref 0.3–1.2)

## 2010-06-09 LAB — CBC
HCT: 40.1 % (ref 36.0–46.0)
Hemoglobin: 13.6 g/dL (ref 12.0–15.0)
MCH: 30 pg (ref 26.0–34.0)
MCV: 88.5 fL (ref 78.0–100.0)
RBC: 4.53 MIL/uL (ref 3.87–5.11)

## 2010-06-10 ENCOUNTER — Inpatient Hospital Stay (HOSPITAL_COMMUNITY): Payer: Medicare Other

## 2010-06-10 DIAGNOSIS — I10 Essential (primary) hypertension: Secondary | ICD-10-CM

## 2010-06-10 DIAGNOSIS — K219 Gastro-esophageal reflux disease without esophagitis: Secondary | ICD-10-CM

## 2010-06-10 DIAGNOSIS — K5289 Other specified noninfective gastroenteritis and colitis: Secondary | ICD-10-CM

## 2010-06-10 DIAGNOSIS — E860 Dehydration: Secondary | ICD-10-CM

## 2010-06-10 MED ORDER — IOHEXOL 300 MG/ML  SOLN
80.0000 mL | Freq: Once | INTRAMUSCULAR | Status: AC | PRN
  Administered 2010-06-10: 80 mL via INTRAVENOUS

## 2010-06-11 ENCOUNTER — Inpatient Hospital Stay (HOSPITAL_COMMUNITY): Payer: Medicare Other

## 2010-06-11 DIAGNOSIS — I369 Nonrheumatic tricuspid valve disorder, unspecified: Secondary | ICD-10-CM

## 2010-06-11 MED ORDER — GADOBENATE DIMEGLUMINE 529 MG/ML IV SOLN
20.0000 mL | Freq: Once | INTRAVENOUS | Status: AC | PRN
  Administered 2010-06-11: 20 mL via INTRAVENOUS

## 2010-06-13 NOTE — Discharge Summary (Signed)
[name], Santos                   ACCOUNT NO.:  [id number]  MEDICAL RECORD NO.:  [id number]           PATIENT TYPE:  I  LOCATION:  1504                         FACILITY:  [facility]  PHYSICIAN:  Santos Gess. Norins, MD  DATE OF BIRTH:  1922-09-08  DATE OF ADMISSION:  06/07/2010 DATE OF DISCHARGE:  06/11/2010                              DISCHARGE SUMMARY   ADMITTING DIAGNOSES: 1. Refractory nausea and vomiting with possible acute gastritis. 2. Mild dehydration secondary to #1. 3. Gastroesophageal reflux disease. 4. Hypertension.  DISCHARGE DIAGNOSES: 1. Refractory nausea and vomiting with possible acute gastritis. 2. Mild dehydration secondary to #1. 3. Gastroesophageal reflux disease. 4. Hypertension. 5. Possible transient ischemic attack.  PROCEDURES: 1. The patient had MRI of the brain without contrast that was read as     showing atrophy and small vessel disease with no acute intracranial     change. 2. Acute abdominal series which revealed nonspecific, nonobstructive     bowel gas pattern.  Small pulmonary nodular densities were noted.     Chest CT was recommended. 3. CT the chest with contrast which showed a 4-mm pulmonary nodule in     the right middle lobe with a followup in 1 year if the patient is     at high risk for bronchogenic carcinoma; if low risk, no followup     is needed. 4. Acute code stroke MRI with and without contrast, June 11, 2010.     There is atrophy and chronic microvascular ischemic with no acute     infarct, mass, or other acute change. 5. Carotid Dopplers with preliminary report being no ICA stenosis. 6. A 2-D echo report pending at time of discharge dictation.  HISTORY OF PRESENT ILLNESS:  This was an 77 year old woman who has had a problem for the past week with recurrent queasiness, vomiting, and progressive fatigue.  She denied any fever or chills, history of sick contacts, abdominal pain or diarrhea, or any ingestion of unusual foods. She  had been seen at medical center ED on June 02, 2010, and had CT scan of the brain at that time that was negative.  She was seen back, March 26th, with same symptoms, was given IV fluids.  She was subsequently seen in the office on March 27th, was thought to be medically stable at that time with minimal residual nausea, but no vomiting for 24 hours.  Evidently, she had recurrent symptoms and was brought to the emergency department on the 29th and subsequently admitted.  Please see the H and P for past medical history, family history, social history, and admission examination.  HOSPITAL COURSE: 1. GI:  The patient with nausea and vomiting which did improve during     her hospital stay.  Her laboratories remained stable with a normal     CBC, metabolic panel was unremarkable with a creatinine of 0.74,     LFTs were normal.  The patient was able to advance her diet.  IV     fluids were discontinued.  She continued to do well and had no     recurrent nausea  or vomiting despite being off IV fluids.  She was     able tolerate a diet. 2. Hypertension.  The patient's blood pressure had been mildly     elevated during her stay.  Diltiazem was increased to 240 mg daily. 3. Pulmonary, question of new pulmonary nodules on chest x-ray.  The     patient had followup CT scan of the chest with contrast with     results as above. 4. Neuro:  On the morning of discharge date when being assessed, the     patient had a problem with word finding and expressive aphasia.     Her examination was unremarkable for any other neurologic symptoms.     Semi-Code Stroke was called and the patient had an emergent MRI     with results as above.  She was seen in consultation by Felicie Morn, PA, with his attending to follow.  The pain was that she may     have had a mild TIA with rapid resolution of symptoms.  The patient     did have a swallow screen which was passed.  She was already on     Plavix and aspirin  which was continued.  The only study pending at     time of discharge dictation is a 2-D echo.  The patient has had     recent labs including A1cs and lipid panels and these are pending     at time of discharge, although I doubt there would be any     significant change.  The patient continued to maintain her normal     mental status with no recurrent episodes of expressive aphasia.     With her evaluation being complete with no signs of acute stroke     with her being back to normal mental status, she is felt to be     stable and ready for discharge to home.  DISCHARGE EXAMINATION:  VITAL SIGNS:  Data system is down, but evidently the patient's vital signs are stable. GENERAL APPEARANCE:  This is a well-nourished woman looking younger than her stated chronologic age, sitting up in a chair, in civilian clothes, in no acute distress. HEENT:  Normocephalic, atraumatic.  The patient has somewhat drooping eyelids which is chronic.  Conjunctivae and sclerae were clear.  Pupils equal, round, and reactive. NECK:  Supple. CHEST:  Patient is moving air well with no increased work of breathing. CARDIOVASCULAR:  She had a regular rate and rhythm. ABDOMEN:  Soft. NEUROLOGIC:  At time of discharge, the patient is awake, alert.  She is oriented to person, place, time, and context.  Her speech is clear.  She has no difficulty with expressive aphasia.  She has no motor strength deficits.  FINAL LABORATORY DATA:  Chemistries on the 31st with a sodium of 135, potassium 4, chloride 104, CO2 of 26, BUN 7, creatinine 0.74, glucose 102.  LFTs were normal.  Labs pending at discharge dictation include A1c and lipid panel.  IMAGING STUDIES:  As noted above.  DISPOSITION:  The patient is discharged home.  She will continue on all of her home medications with the increase in her Cartia XT 240 mg daily. No other changes were made in her medications.  FOLLOWUP:  The patient will be seen in the office in  7-10 days and the office will contact her with an appropriate appointment time.  The patient's condition at time of discharge dictation is medically stable with  no persistent neurologic symptoms.     Santos Gess Norins, MD     MEN/MEDQ  D:  06/11/2010  T:  06/12/2010  Job:  [id number]  Electronically Signed by Illene Regulus MD on 06/13/2010 04:30:24 PM

## 2010-06-18 ENCOUNTER — Ambulatory Visit (INDEPENDENT_AMBULATORY_CARE_PROVIDER_SITE_OTHER): Payer: Medicare Other | Admitting: Internal Medicine

## 2010-06-18 ENCOUNTER — Encounter: Payer: Self-pay | Admitting: Internal Medicine

## 2010-06-18 VITALS — BP 142/70 | HR 75 | Temp 98.5°F | Wt 173.0 lb

## 2010-06-18 DIAGNOSIS — K5289 Other specified noninfective gastroenteritis and colitis: Secondary | ICD-10-CM

## 2010-06-18 DIAGNOSIS — G459 Transient cerebral ischemic attack, unspecified: Secondary | ICD-10-CM

## 2010-06-18 DIAGNOSIS — K529 Noninfective gastroenteritis and colitis, unspecified: Secondary | ICD-10-CM

## 2010-06-18 NOTE — Progress Notes (Signed)
  Subjective:    Patient ID: [name], female    DOB: 1923/02/04, 77 y.o.   MRN: [id number]  HPI [name] presents for hospital follow-up. IN the past 14 days she has had two visits to the ED for weakness, abdominal pain and N/V. She had one office visit. She returned to the ED and was hospitalized for recurrent persistent symptoms of GI disturbance and weakness. Initial MRI brain at the admission as well as lab were normal. She did do well. On routine chest x-ray she had a suspicious shadow and came to CT chest that was negative. On the day of discharge, one week ago, she had a transient episode of expressive aphasia. She had a repeat MRI that was negative, swallow eval that was normal and neuro consult which opined for a very brief TIA. She did not have a 2 D echo. Her symptoms did clear completely and she was able to be discharged home later that day on plavix and aspirin.  In the interval she has been doing well. She did have some minor nausea,no emesis and she has been eating regularly and well. She has had normal bowel function. She has been feeling well for several days and was able to enjoy a full day of playing contract bridge.  PMH, FamHx and SocHx reviewed for any changes and relevance.     Review of Systems Review of Systems  Constitutional:  Negative for fever, chills, activity change and unexpected weight change.  HENT:  Negative for hearing loss, ear pain, congestion, neck stiffness and postnasal drip.   Eyes: Negative for pain, discharge and visual disturbance.  Respiratory: Negative for chest tightness and wheezing.   Cardiovascular: Negative for chest pain and palpitations.       [No decreased exercise tolerance Gastrointestinal: [No change in bowel habit. No bloating or gas. No reflux or indigestion Genitourinary: Negative for urgency, frequency, flank pain and difficulty urinating.  Musculoskeletal: Negative for myalgias, back pain, arthralgias and gait problem.    Neurological: Negative for dizziness, tremors, weakness and headaches.  Hematological: Negative for adenopathy.  Psychiatric/Behavioral: Negative for behavioral problems and dysphoric mood.       Objective:   Physical Exam  Vitals reviewed. Constitutional: She is oriented to person, place, and time. She appears well-developed and well-nourished. No distress.  HENT:  Head: Normocephalic and atraumatic.  Eyes: Conjunctivae and EOM are normal. Pupils are equal, round, and reactive to light.  Neck: Normal range of motion. Neck supple. No thyromegaly present.  Cardiovascular: Normal rate, regular rhythm and normal heart sounds.   Pulmonary/Chest: Effort normal. She has no wheezes. She has no rales.  Abdominal: Soft. Bowel sounds are normal. There is no tenderness. There is no rebound and no guarding.  Musculoskeletal: Normal range of motion.  Neurological: She is alert and oriented to person, place, and time. She has normal reflexes. No cranial nerve deficit.  Skin: Skin is warm and dry.  Psychiatric: She has a normal mood and affect. Her behavior is normal. Thought content normal.          Assessment & Plan:  1. GI- patient with a probable viral gastroenteritis that was slow to resolve. She is doing fine now.  2. Neuro - a very transient episode of witnessed expressive aphasia that clear quickly and completely with a normal neuro evaluation, including re-imaging of the brain.  Plan - continue ASA and plavix.   3. Hypertension - adequately controlled.

## 2010-07-11 ENCOUNTER — Other Ambulatory Visit: Payer: Self-pay | Admitting: Internal Medicine

## 2010-07-18 NOTE — Consult Note (Signed)
[name], [name]                   ACCOUNT NO.:  [id number]  MEDICAL RECORD NO.:  [id number]           PATIENT TYPE:  I  LOCATION:  1504                         FACILITY:  [facility]  PHYSICIAN:  Thana Farr, MD    DATE OF BIRTH:  02-16-1923  DATE OF CONSULTATION: DATE OF DISCHARGE:                                CONSULTATION   REASON FOR CONSULTATION:  Transient expressive aphasia and decreased right visual field lasting approximately 15 to 20 minutes.  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  The patient was admitted to the hospital on May 18, 2010, initially for nausea, vomiting and dehydration.  The patient was doing well during the hospital stay and was being prepared to be discharged on June 11, 2010.  However, at approximately 7:15 in the morning, the patient noted while talking to one of the physicians that she had significant expressive aphasia.  She describes it as the ability to think of what she wanted to say but inability to make sense as the words came out of her mouth.  She understood everything that was going on in her surroundings.  She understood other people's vocal wording.  She was able to move all extremities and had no numbness or tingling in either extremity.  However, she does state for a portion of this expressive aphasia she also noted a decrease in her right visual field.  The patient states that these symptoms lasted for approximately 15 to 20 minutes and then resolved on their own.  At the present time the patient is back to her baseline.  She is expressing herself very well and has no visual abnormalities.  Neurology was asked to follow up on the patient for the possibility of TIA versus stroke.  At the present time the patient will be brought to the MRI for  further evaluation.  The patient denies any nausea, vomiting, dizziness, headache, or blurred vision.  She does state that she had a decreased visual field on the right transiently.  Denies any numbness and tingling in the upper or lower extremities.  Denies any bowel or bladder incontinence.  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  MEDICATIONS:  In the hospital the patient has been placed on aspirin, Plavix, Dulcolax, Cardizem, Reglan, Protonix, Tylenol, hydralazine, morphine, Zofran, and Phenergan.  ALLERGIES:  The patient is not allergic to any medications, but does have stomach upset with ASPIRIN.  SOCIAL HISTORY:  The patient does not drink, smoke or do illicit drugs. She is retired.  She used to work as a Special educational needs teacher.  She is a widow for approximately 4 years now and lives alone.  REVIEW OF SYSTEMS:  Review of systems negative with the exception of the above.  PHYSICAL EXAMINATION:  VITAL SIGNS:  Blood pressure 136/82, temperature is 98.5, pulse is 67, respirations 20. NEUROLOGIC:  The patient is  alert and oriented x3.  Carries out two and three-step commands without any difficulty.  Pupils are equal, round, and reactive to light and accommodating conjugate.  Extraocular movements are intact.  Visual fields grossly intact.  Face is symmetrical.  Tongue is midline.  Uvula is midline.  The patient shows no dysarthria, no aphasia and no slurred speech at this time.  Facial sensation is full from V1-V3 bilaterally.  Shoulder shrug and head turn is within normal limits.  Coordination, finger-to-nose and heel-to-shin were smooth.  The patient showed no weakness throughout with a 5/5 strength.  Deep tendon reflexes were 2+ throughout with downgoing toes bilaterally.  The patient showed no drift in the upper or lower extremities and sensation was grossly intact to pinprick and light  touch throughout. PULMONARY:  Clear to auscultation. CARDIOVASCULAR:  S1, S2 was audible. NECK:  Negative for bruits and supple.  LABORATORY DATA:  UA was negative.  Sodium 135, potassium 4.0, chloride 104, CO2 26, BUN 7, creatinine 0.74, glucose 102, white blood cell count 7.2, platelets 307, hemoglobin is 13.6, hematocrit 40.1.  IMAGING:  MRI is pending.  Carotid Dopplers pending.  2-D echocardiogram pending.  ASSESSMENT:  This is an 77 year old Caucasian female with transient expressive aphasia with a right visual field deficit at the same time. At this time it is very likely that the patient experienced a left brain transient ischemic attack.  RECOMMENDATIONS: 1. At this time would recommend the patient being n.p.o. until passes     stroke swallow screen. 2. Continue with Plavix and aspirin. 3. Agree with MRI of the brain to look for acute findings. 4. Would recommend while in-hospital 2-D echocardiogram, carotid     Dopplers, HbA1c and fasting lipid panel. 5. If all tests are negative, would recommend continuing aspirin and     Plavix as an outpatient and follow up with Dr. Riley Nearing as an     outpatient for further evaluation.  I have discussed these findings and recommendations with Dr. Thad Ranger. Dr. Thad Ranger will be seeing the patient later this afternoon to further evaluate the patient after MRI.     Felicie Morn, PA-C   ______________________________ Thana Farr, MD    DS/MEDQ  D:  06/11/2010  T:  06/11/2010  Job:  [id number]  cc:   Dr. Thad Ranger [facility]  Electronically Signed by Felicie Morn PA-C on 07/17/2010 10:23:55 AM Electronically Signed by Thana Farr MD on 07/18/2010 05:50:17 PM

## 2010-07-27 NOTE — Assessment & Plan Note (Signed)
[facility]                           PRIMARY CARE OFFICE NOTE   [name], [name]                          MRN:          [id number]  DATE:04/22/2006                            DOB:          Jul 17, 1922    This was a pleasant 77 year old woman who is followed for GERD and  peptic ulcer disease, hypertension, who presents today complaining of  progressive knee pain.  The patient was seen for this problem on  February 26, 2006, when she was complaining of increasing knee pain.  X-  rays were obtained at [facility] on February 26, 2006, which  revealed bilateral arthritic changes in both knees, with no evidence for  effusion or fracture.   The patient reports that she has had an exacerbation of pain in her left  knee, with increasing pain, discomfort, and mild swelling.   GI:  The patient has a history of peptic ulcer disease.  She recently  was visiting her family and indulged in a different diet.  She also was  around different people.  On returning home, she did experience several  episodes of nausea and vomiting and intolerance of foods.  She has had  no particular epigastric pain or discomfort.  She has had no diarrhea.   CURRENT MEDICATIONS:  1. Metamucil daily.  2. Centrum Silver daily.  3. Diltiazem 180 mg daily.  4. Plavix 75 mg daily.  5. Aspirin 81 mg daily.  6. Prilosec OTC 1 q.a.m.   REVIEW OF SYSTEMS:  Unremarkable.   ADDITIONAL PAST MEDICAL HISTORY:  The patient had cataract surgery on  the left eye about 2 weeks ago, with good results.   PHYSICAL EXAMINATION:  VITAL SIGNS:  Temperature 97.2, blood pressure  122/67, pulse 75, weight 172.  GENERAL APPEARANCE:  An overweight Caucasian woman in no acute distress.  ABDOMEN:  The patient has positive bowel sounds, with no tenderness in  the epigastrium.  EXTREMITIES:  The patient has no effusion or erythema about the left  knee, but she does have discomfort with movement.   A click was noted.   PROCEDURE:  Intraarticular steroid injection left knee with verbal  consent, with the patient being advised of the risks of bleeding and  infection.  The medial aspect of the left knee was prepped with Betadine  and alcohol, then sprayed with ethylene chloride for topical anesthesia.  She was injected with 1 cc of 0.5% Sensorcaine and 80 mg of Depo-Medrol.  The patient tolerated this procedure well, without complications.  She  had immediate relief of discomfort and was much better at weightbearing.   ASSESSMENT AND PLAN:  1. Musculoskeletal.  Patient with degenerative joint disease of both      knees, now greater on the left.  She tolerated a steroid injection      well.  She is advised to notify me if her symptoms recur.  She may      have another injection in 3 months.  She may need orthopedic      consultation for Synvisc injections.  2. Gastrointestinal.  Patient  with no evidence of peptic ulcer disease      or dyspepsia.  Suspect she may have had a viral gastroenteritis.   PLAN:  The patient is to increase Prilosec to 2 capsules q.a.m. for one  week, then return to 1 capsule q.a.m.  She is given a prescription for  Phenergan 12.5 mg to take on a p.r.n. basis for nausea and vomiting.     Rosalyn Gess Norins, MD  Electronically Signed    MEN/MedQ  DD: 04/22/2006  DT: 04/22/2006  Job #: [id number]

## 2010-07-27 NOTE — H&P (Signed)
NAMEKELSEY, DURFLINGER                   ACCOUNT NO.:  [id number]   MEDICAL RECORD NO.:  [id number]          PATIENT TYPE:  EMS   LOCATION:  ED                           FACILITY:  [facility]   PHYSICIAN:  Georgina Quint. Plotnikov, M.D. Romualdo Bolk OF BIRTH:  May 29, 1922   DATE OF ADMISSION:  09/18/2005  DATE OF DISCHARGE:                                HISTORY & PHYSICAL   CHIEF COMPLAINT:  None.   HISTORY OF PRESENT ILLNESS:  The patient is an 77 year old female who took a  nap around 3:00 this afternoon. He woke up in about 30 or 40 minutes and was  unable to use her left arm for a while.  She states she was sleeping on her  back.  She denies headache, loss of consciousness.  Per daughter she had a  little bit of slurred speech when she arrived later.  The patient took two  aspirins at home and then presented to the ER.   CURRENT MEDICATIONS:  1.  Multivitamin.  2.  Sertraline 25 mg daily.  3.  Diltiazem 120 mg daily.  4.  Ranitidine 150 mg b.i.d.  5.  Metamucil.  6.  Tylenol.  7.  Calcium.  8.  Vitamin D.   PAST MEDICAL HISTORY:  1.  Depression.  2.  Hypertension.  3.  GERD.   ALLERGIES:  ASPIRIN possibly makes her stomach upset.   FAMILY HISTORY:  Positive for coronary artery disease.   SOCIAL HISTORY:  Does not smoke or drink alcohol.  Nonsmoker, nondrinker.   REVIEW OF SYSTEMS:  No chest pain or shortness of breath.  No headache.  No  syncopal spells.  No previous episodes like it.  No abdominal pain.  No leg  swelling.  The rest is negative or as above.   PHYSICAL EXAMINATION:  VITAL SIGNS: Blood pressure 166/90, pulse 70,  respirations 20, temperature 97.1.  GENERAL:  She looks well. She is in no acute distress.  HEENT:  Moist mucosa.  NECK:  Supple.  No thyromegaly or bruits.  LUNGS:  Clear.  No wheezes or rales.  HEART:  S1 and S2.  No gallop.  ABDOMEN:  Soft, nontender.  No organomegaly or masses felt.  EXTREMITIES:  Lower extremities without edema.  Calves nontender.  NEUROLOGIC:  She is alert and oriented time four. She denies being  depressed.  Cranial nerves II-XII normal. Deep tendon reflexes and strength  within normal limits. Pupils reactive.  No double vision.   LABORATORY DATA:  White count 7.0, hemoglobin 13.9.  INR 1.0.  CT of head  preliminary unremarkable.   ASSESSMENT/PLAN:  1.  Episode of left arm numbness and weakness.  She took aspirin at home.      Mostly differentiating between transient ischemic attack versus      cerebrovascular accident; less likely __________ neuropathy versus      (acute).  She is on her way to get an MRI.  Will treat with IV fluids,      oxygen, start Plavix 75 mg daily.  If MRI is positive for stroke would  get a carotid Doppler ultrasound.  If negative would also obtain a sed      rate, vitamin B12 levels.  2.  Hypertension.  Continue current therapy. Re-evaluate tomorrow.  3.  Gastroesophageal reflux disease.  On therapy.  Will continue.           ______________________________  Georgina Quint. Plotnikov, M.D. [facility]     AVP/MEDQ  D:  09/18/2005  T:  09/18/2005  Job:  [id number]   cc:   Rosalyn Gess. Norins, M.D. [facility]  [address]  [place]  [place] [address]

## 2010-07-27 NOTE — Discharge Summary (Signed)
[name], [name]                   ACCOUNT NO.:  [id number]   MEDICAL RECORD NO.:  [id number]          PATIENT TYPE:  INP   LOCATION:  1411                         FACILITY:  [facility]   PHYSICIAN:  Rosalyn Gess. Norins, M.D. LHCDATE OF BIRTH:  1922-04-19   DATE OF ADMISSION:  09/18/2005  DATE OF DISCHARGE:  09/20/2005                                 DISCHARGE SUMMARY   ADMITTING DIAGNOSIS:  Left arm weakness.   DISCHARGE DIAGNOSIS:  Cerebrovascular accident.   CONSULTANTS:  None.   PROCEDURE:  1.  CT scan of the brain performed the day of admission which showed slight      age-related cerebral atrophy with no acute intracranial abnormality.  2.  MRI of the brain without contrast which revealed an acute right      posterior frontal cortical infarct, sub-centimeter in size affecting the      motor strip that could correlate with the patient's left arm weakness..      Patient with atrophy that is chronic microvascular ischemic change and      numerous remote infarcts.   HISTORY OF PRESENT ILLNESS:  The patient is a delightful 77 year old woman  who awoke from her nap on the day of admission and for about 30-40 minutes  was unable to use her left arm.  She states that she had been sleeping on  her back.  She denied any headache or loss of consciousness.  Her daughter  had reported a bit of slurred speech when she arrived to see her mother  later in the day.  The patient did take 2 aspirins at home and then  presented to the emergency department and was subsequently admitted.  Please  see H&P for CURRENT MEDICATIONS, PAST MEDICAL HISTORY, FAMILY HISTORY, and  ADMITTING PHYSICAL EXAM.  The exam was remarkable for the patient being  alert and oriented x4.  Cranial nerves II-XII were normal.  Deep tendon  reflexes and strength were normal.  Pupils were reactive and without  abnormality.   HOSPITAL COURSE:  Neuro: The patient has been admitted with question of TIA.  Her left arm weakness had  resolved by the time of admission.  During her  hospital course, she continued to have freedom from symptoms.  MRI scan was  ordered and results are as noted.  The patient was started on Plavix at  admission.  The patient had in the past had peptic ulcer disease with GI  bleed approximately 5 years ago and was somewhat reluctant to take aspirin,  however, 81 mg aspirin was added to her regimen.  A proton pump was added in  addition for GI protection.  Carotid Dopplers had been requested to be  performed 09/19/2005, but were not and will be scheduled as an outpatient.   With the patient's symptoms having continued to remain abated with no new  problems the patient is felt to be stable and ready for discharge.   DISCHARGE EXAMINATION:  Temperature of 97.4.  Blood pressure was 136/72.  Pulse 66.  Respirations were 18.  O2 sat was 97% on room  air.  GENERAL APPEARANCE:  Well-nourished, well-developed woman looking under her  stated age in no acute distress.  NEUROLOGIC EXAM: The patient is awake, alert.  She is oriented to person,  place, time and context.  Cranial nerves II-XII:  Patient had normal facial  symmetry and movement.  Extraocular muscles were intact.  Motor strength was  normal in both right and left upper extremity.   DISPOSITION:  The patient is stable and discharged home.   DISCHARGE MEDICATIONS:  The patient will resume her home medications  including:  1.  Sertraline 25 mg daily.  2.  Diltiazem 120 mg daily.  3.  She will be sent home on Prilosec OTC 20 mg daily.  4.  Aspirin 81 mg daily.  5.  Plavix 75 mg daily.   DISPOSITION:  Includes the patient be scheduled for outpatient carotid  Doppler at [facility].  She will see Dr. Debby Bud in follow-up in 2  weeks.   The patient's condition at time of discharge dictation is stable and  improved.           ______________________________  Rosalyn Gess Norins, M.D. [facility]     MEN/MEDQ  D:  09/20/2005  T:  09/20/2005   Job:  [id number]

## 2010-10-02 ENCOUNTER — Other Ambulatory Visit: Payer: Self-pay | Admitting: Internal Medicine

## 2010-11-07 ENCOUNTER — Other Ambulatory Visit: Payer: Self-pay | Admitting: Internal Medicine

## 2011-04-29 ENCOUNTER — Other Ambulatory Visit: Payer: Self-pay | Admitting: Internal Medicine

## 2011-05-13 DIAGNOSIS — Z961 Presence of intraocular lens: Secondary | ICD-10-CM | POA: Diagnosis not present

## 2011-05-13 DIAGNOSIS — H52209 Unspecified astigmatism, unspecified eye: Secondary | ICD-10-CM | POA: Diagnosis not present

## 2011-05-13 DIAGNOSIS — H02839 Dermatochalasis of unspecified eye, unspecified eyelid: Secondary | ICD-10-CM | POA: Diagnosis not present

## 2011-05-13 DIAGNOSIS — H11009 Unspecified pterygium of unspecified eye: Secondary | ICD-10-CM | POA: Diagnosis not present

## 2011-07-29 ENCOUNTER — Other Ambulatory Visit: Payer: Self-pay | Admitting: Internal Medicine

## 2011-08-21 ENCOUNTER — Other Ambulatory Visit (INDEPENDENT_AMBULATORY_CARE_PROVIDER_SITE_OTHER): Payer: Medicare Other

## 2011-08-21 ENCOUNTER — Ambulatory Visit (INDEPENDENT_AMBULATORY_CARE_PROVIDER_SITE_OTHER): Payer: Medicare Other | Admitting: Internal Medicine

## 2011-08-21 ENCOUNTER — Encounter: Payer: Self-pay | Admitting: Internal Medicine

## 2011-08-21 VITALS — BP 132/60 | HR 84 | Temp 98.2°F | Resp 16 | Ht 65.0 in | Wt 174.0 lb

## 2011-08-21 DIAGNOSIS — I10 Essential (primary) hypertension: Secondary | ICD-10-CM

## 2011-08-21 DIAGNOSIS — Z0000 Encounter for general adult medical examination without abnormal findings: Secondary | ICD-10-CM

## 2011-08-21 DIAGNOSIS — K219 Gastro-esophageal reflux disease without esophagitis: Secondary | ICD-10-CM

## 2011-08-21 DIAGNOSIS — K279 Peptic ulcer, site unspecified, unspecified as acute or chronic, without hemorrhage or perforation: Secondary | ICD-10-CM

## 2011-08-21 DIAGNOSIS — R609 Edema, unspecified: Secondary | ICD-10-CM

## 2011-08-21 LAB — COMPREHENSIVE METABOLIC PANEL
ALT: 21 U/L (ref 0–35)
AST: 19 U/L (ref 0–37)
Albumin: 4.2 g/dL (ref 3.5–5.2)
Alkaline Phosphatase: 123 U/L — ABNORMAL HIGH (ref 39–117)
Calcium: 9.5 mg/dL (ref 8.4–10.5)
Chloride: 103 mEq/L (ref 96–112)
Creatinine, Ser: 1.2 mg/dL (ref 0.4–1.2)
Potassium: 4.9 mEq/L (ref 3.5–5.1)

## 2011-08-21 LAB — CBC WITH DIFFERENTIAL/PLATELET
Basophils Absolute: 0 10*3/uL (ref 0.0–0.1)
Basophils Relative: 0.5 % (ref 0.0–3.0)
Eosinophils Absolute: 0.2 10*3/uL (ref 0.0–0.7)
Hemoglobin: 14.2 g/dL (ref 12.0–15.0)
Lymphocytes Relative: 28.3 % (ref 12.0–46.0)
MCHC: 33.4 g/dL (ref 30.0–36.0)
MCV: 91.7 fl (ref 78.0–100.0)
Monocytes Absolute: 0.7 10*3/uL (ref 0.1–1.0)
Neutro Abs: 4.9 10*3/uL (ref 1.4–7.7)
RBC: 4.63 Mil/uL (ref 3.87–5.11)
RDW: 12.9 % (ref 11.5–14.6)

## 2011-08-21 NOTE — Progress Notes (Signed)
Subjective:    Patient ID: [name], female    DOB: May 13, 1922, 78 y.o.   MRN: [id number]  HPI [name]  is here for annual Medicare wellness examination and management of other chronic and acute problems.   The risk factors are reflected in the social history.  The roster of all physicians providing medical care to patient - is listed in the Snapshot section of the chart.  Activities of daily living:  The patient is 100% inedpendent in all ADLs: dressing, toileting, feeding as well as independent mobility  Home safety : The patient does not have smoke detectors in the home. Falls - fall safe - handicap friendly. They wear seatbelts. No firearms at home  There is no risks for hepatitis, STDs or HIV. There is no history of blood transfusion. They have no travel history to infectious disease endemic areas of the world.  The patient has seen their dentist in the last six month. They have  seen their eye doctor in the last year. They admit to hearing difficulty and have not had audiologic testing in the last year.  They do not  have excessive sun exposure. Discussed the need for sun protection: hats, long sleeves and use of sunscreen if there is significant sun exposure.   Diet: the importance of a healthy diet is discussed. They do have a healthy diet.  The patient has a regular exercise program: golf , 60 min duration, 1 per week.  The benefits of regular aerobic exercise were discussed.  Depression screen: there are no signs or vegative symptoms of depression- irritability, change in appetite, anhedonia, sadness/tearfullness.  Cognitive assessment: the patient manages all their financial and personal affairs and is actively engaged. Plays contract bridge.  The following portions of the patient's history were reviewed and updated as appropriate: allergies, current medications, past family history, past medical history,  past surgical history, past social history  and problem  list.  Vision, hearing, body mass index were assessed and reviewed.   During the course of the visit the patient was educated and counseled about appropriate screening and preventive services including : fall prevention , diabetes screening, nutrition counseling, colorectal cancer screening, and recommended immunizations.  Past Medical History  Diagnosis Date  . Adenomatous polyps   . Pyloric stenosis   . History of UTI   . Edema   . Effusion of ankle and foot joint   . Special screening for malignant neoplasm of colon   . Observation for suspected cardiovascular disease   . Other abnormal blood chemistry   . Cerebrovascular accident   . S/P total abdominal hysterectomy   . Hypertension   . Peptic ulcer disease   . GERD (gastroesophageal reflux disease)    Past Surgical History  Procedure Date  . Cyst on right hand removed   . Cataract extraction 2008    Dr Dagoberto Ligas   Family History  Problem Relation Age of Onset  . Heart failure Mother   . Hypertension Mother   . Coronary artery disease Mother    History   Social History  . Marital Status: Widowed    Spouse Name: N/A    Number of Children: N/A  . Years of Education: N/A   Occupational History  . Not on file.   Social History Main Topics  . Smoking status: Former Smoker    Quit date: 03/11/1948  . Smokeless tobacco: Never Used  . Alcohol Use: Not on file  . Drug Use: Not on file  .  Sexually Active: Not on file   Other Topics Concern  . Not on file   Social History Narrative   [facility], [employer]GMarried '50- widowed after 57 yearsLives alone in own home. On the list FHWEnd of life: No CPR, no heroic/futile treatmentPatient has never smokedAlcohol use-noDaily Caffine use-OCCIllicit Drug Use-no       Review of Systems Constitutional:  Negative for fever, chills, activity change and unexpected weight change.  HEENT:  Negative for hearing loss, ear pain, congestion, neck stiffness and postnasal drip. Negative for  sore throat or swallowing problems. Negative for dental complaints.   Eyes: Negative for vision loss or change in visual acuity.  Respiratory: Negative for chest tightness and wheezing. Negative for DOE.   Cardiovascular: Negative for chest pain or palpitations. No decreased exercise tolerance Gastrointestinal: No change in bowel habit. No bloating or gas. No reflux or indigestion Genitourinary: Negative for urgency, frequency, flank pain and difficulty urinating.  Musculoskeletal: Possitive for arthralgias knees but gait problem.  Neurological: Negative for dizziness, tremors, weakness and headaches.  Hematological: Negative for adenopathy.  Psychiatric/Behavioral: Negative for behavioral problems and dysphoric mood.       Objective:   Physical Exam Filed Vitals:   08/21/11 1406  BP: 132/60  Pulse: 84  Temp: 98.2 F (36.8 C)  Resp: 16   Wt Readings from Last 3 Encounters:  08/21/11 174 lb (78.926 kg)  06/18/10 173 lb (78.472 kg)  06/05/10 174 lb (78.926 kg)    Gen'l: well nourished, well developed white woman in no distress HEENT - [place]/AT, EACs cerumen R>L/TMs normal, oropharynx with native dentition in good condition, no buccal or palatal lesions, posterior pharynx clear, mucous membranes dry. C&S clear, PERRLA, fundi - poorly visualized. Neck - supple, no thyromegaly Nodes- negative submental, cervical, supraclavicular regions Chest - no deformity, no CVAT Lungs - cleat without rales, wheezes. No increased work of breathing Breast - deferred Cardiovascular - regular rate and rhythm, quiet precordium, no murmurs, rubs or gallops, 2+ radial, DP  pulses Abdomen - BS+ x 4, no HSM, no guarding or rebound or tenderness Pelvic - deferred to age Rectal - deferred  Extremities - no clubbing, cyanosis, edema or deformity.  Neuro - A&O x 3, CN II-XII normal, motor strength normal and equal, DTRs 2+ and symmetrical biceps, radial, and patellar tendons. Cerebellar - no tremor, no  rigidity, fluid movement and normal gait. Derm - Head, neck, back, abdomen and extremities without suspicious lesions. A scaly, firm lesion on the left neck.  Lab Results  Component Value Date   WBC 8.1 08/21/2011   HGB 14.2 08/21/2011   HCT 42.5 08/21/2011   PLT 319.0 08/21/2011   GLUCOSE 103* 08/21/2011   CHOL 207* 06/05/2007   TRIG 116 06/05/2007   HDL 72.2 06/05/2007   LDLDIRECT 113.8 06/05/2007   ALT 21 08/21/2011   AST 19 08/21/2011   NA 139 08/21/2011   K 4.9 08/21/2011   CL 103 08/21/2011   CREATININE 1.2 08/21/2011   BUN 18 08/21/2011   CO2 27 08/21/2011   TSH 0.63 08/21/2011         Assessment & Plan:

## 2011-08-25 DIAGNOSIS — Z0000 Encounter for general adult medical examination without abnormal findings: Secondary | ICD-10-CM | POA: Insufficient documentation

## 2011-08-25 NOTE — Assessment & Plan Note (Signed)
No appreciable edema on today's exam.

## 2011-08-25 NOTE — Assessment & Plan Note (Signed)
Interval hx of UTI, swelling at the ankle. Physical exam is normal except for minimal ankle swelling. Up-dated labs are in normal range. She is current with colonoscopy and surveillance. She is current with breast cancer screening for her age group. Immunizations: she is due for pneumonia vaccine at her convenience.  In summary - a very nice woman, young for her age, who is medically stable. She will return in 6 months for routine follow-up, sooner as needed.

## 2011-08-25 NOTE — Assessment & Plan Note (Signed)
Stable with no complaints of GI distress.

## 2011-08-25 NOTE — Assessment & Plan Note (Signed)
BP Readings from Last 3 Encounters:  08/21/11 132/60  06/18/10 142/70  06/05/10 152/70   Adequate control on present regimen  Plan Continue current medications.

## 2011-08-26 ENCOUNTER — Other Ambulatory Visit: Payer: Self-pay | Admitting: Internal Medicine

## 2011-09-30 DIAGNOSIS — H905 Unspecified sensorineural hearing loss: Secondary | ICD-10-CM | POA: Diagnosis not present

## 2011-09-30 DIAGNOSIS — H903 Sensorineural hearing loss, bilateral: Secondary | ICD-10-CM | POA: Diagnosis not present

## 2011-09-30 DIAGNOSIS — H6120 Impacted cerumen, unspecified ear: Secondary | ICD-10-CM | POA: Diagnosis not present

## 2011-09-30 DIAGNOSIS — H9319 Tinnitus, unspecified ear: Secondary | ICD-10-CM | POA: Diagnosis not present

## 2011-11-18 ENCOUNTER — Inpatient Hospital Stay (HOSPITAL_COMMUNITY)
Admission: EM | Admit: 2011-11-18 | Discharge: 2011-11-20 | DRG: 065 | Disposition: A | Discharge status: Home / Self Care | Payer: Medicare Other | Attending: Internal Medicine | Admitting: Internal Medicine

## 2011-11-18 ENCOUNTER — Inpatient Hospital Stay (HOSPITAL_COMMUNITY): Payer: Medicare Other

## 2011-11-18 ENCOUNTER — Emergency Department (HOSPITAL_COMMUNITY): Payer: Medicare Other

## 2011-11-18 ENCOUNTER — Encounter (HOSPITAL_COMMUNITY): Payer: Self-pay | Admitting: Emergency Medicine

## 2011-11-18 DIAGNOSIS — R29898 Other symptoms and signs involving the musculoskeletal system: Secondary | ICD-10-CM | POA: Diagnosis present

## 2011-11-18 DIAGNOSIS — Z87891 Personal history of nicotine dependence: Secondary | ICD-10-CM | POA: Diagnosis not present

## 2011-11-18 DIAGNOSIS — C55 Malignant neoplasm of uterus, part unspecified: Secondary | ICD-10-CM | POA: Diagnosis not present

## 2011-11-18 DIAGNOSIS — K279 Peptic ulcer, site unspecified, unspecified as acute or chronic, without hemorrhage or perforation: Secondary | ICD-10-CM | POA: Diagnosis not present

## 2011-11-18 DIAGNOSIS — Z8249 Family history of ischemic heart disease and other diseases of the circulatory system: Secondary | ICD-10-CM | POA: Diagnosis not present

## 2011-11-18 DIAGNOSIS — I639 Cerebral infarction, unspecified: Secondary | ICD-10-CM

## 2011-11-18 DIAGNOSIS — M25476 Effusion, unspecified foot: Secondary | ICD-10-CM | POA: Diagnosis not present

## 2011-11-18 DIAGNOSIS — K219 Gastro-esophageal reflux disease without esophagitis: Secondary | ICD-10-CM | POA: Diagnosis not present

## 2011-11-18 DIAGNOSIS — M25473 Effusion, unspecified ankle: Secondary | ICD-10-CM | POA: Diagnosis not present

## 2011-11-18 DIAGNOSIS — Z8673 Personal history of transient ischemic attack (TIA), and cerebral infarction without residual deficits: Secondary | ICD-10-CM | POA: Diagnosis not present

## 2011-11-18 DIAGNOSIS — Q211 Atrial septal defect: Secondary | ICD-10-CM | POA: Diagnosis not present

## 2011-11-18 DIAGNOSIS — R7989 Other specified abnormal findings of blood chemistry: Secondary | ICD-10-CM | POA: Diagnosis not present

## 2011-11-18 DIAGNOSIS — I6789 Other cerebrovascular disease: Secondary | ICD-10-CM | POA: Diagnosis not present

## 2011-11-18 DIAGNOSIS — I6350 Cerebral infarction due to unspecified occlusion or stenosis of unspecified cerebral artery: Principal | ICD-10-CM | POA: Diagnosis present

## 2011-11-18 DIAGNOSIS — R5383 Other fatigue: Secondary | ICD-10-CM | POA: Diagnosis not present

## 2011-11-18 DIAGNOSIS — I200 Unstable angina: Secondary | ICD-10-CM | POA: Diagnosis not present

## 2011-11-18 DIAGNOSIS — J984 Other disorders of lung: Secondary | ICD-10-CM | POA: Diagnosis not present

## 2011-11-18 DIAGNOSIS — I10 Essential (primary) hypertension: Secondary | ICD-10-CM | POA: Diagnosis not present

## 2011-11-18 DIAGNOSIS — R5381 Other malaise: Secondary | ICD-10-CM | POA: Diagnosis not present

## 2011-11-18 DIAGNOSIS — Q2111 Secundum atrial septal defect: Secondary | ICD-10-CM

## 2011-11-18 DIAGNOSIS — M4802 Spinal stenosis, cervical region: Secondary | ICD-10-CM | POA: Diagnosis not present

## 2011-11-18 LAB — POCT I-STAT TROPONIN I
Troponin i, poc: 0.17 ng/mL (ref 0.00–0.08)
Troponin i, poc: 0.13 ng/mL (ref 0.00–0.08)

## 2011-11-18 LAB — CBC WITH DIFFERENTIAL/PLATELET
Basophils Relative: 0 % (ref 0–1)
Eosinophils Absolute: 0.1 10*3/uL (ref 0.0–0.7)
Hemoglobin: 15.2 g/dL — ABNORMAL HIGH (ref 12.0–15.0)
Lymphs Abs: 1.6 10*3/uL (ref 0.7–4.0)
MCH: 30.5 pg (ref 26.0–34.0)
Monocytes Relative: 6 % (ref 3–12)
Neutro Abs: 5 10*3/uL (ref 1.7–7.7)
Neutrophils Relative %: 70 % (ref 43–77)
Platelets: 338 10*3/uL (ref 150–400)
RBC: 4.98 MIL/uL (ref 3.87–5.11)

## 2011-11-18 LAB — COMPREHENSIVE METABOLIC PANEL
ALT: 17 U/L (ref 0–35)
Albumin: 3.8 g/dL (ref 3.5–5.2)
Alkaline Phosphatase: 144 U/L — ABNORMAL HIGH (ref 39–117)
Chloride: 101 mEq/L (ref 96–112)
Glucose, Bld: 127 mg/dL — ABNORMAL HIGH (ref 70–99)
Potassium: 4.1 mEq/L (ref 3.5–5.1)
Sodium: 138 mEq/L (ref 135–145)
Total Bilirubin: 0.2 mg/dL — ABNORMAL LOW (ref 0.3–1.2)
Total Protein: 7.4 g/dL (ref 6.0–8.3)

## 2011-11-18 MED ORDER — ADULT MULTIVITAMIN W/MINERALS CH
1.0000 | ORAL_TABLET | Freq: Every day | ORAL | Status: DC
  Administered 2011-11-19 – 2011-11-20 (×2): 1 via ORAL
  Filled 2011-11-18 (×2): qty 1

## 2011-11-18 MED ORDER — ASPIRIN 81 MG PO CHEW
81.0000 mg | CHEWABLE_TABLET | Freq: Every day | ORAL | Status: DC
  Administered 2011-11-19: 81 mg via ORAL
  Filled 2011-11-18: qty 1

## 2011-11-18 MED ORDER — CENTRUM SILVER PO TABS: 1.0000 | ORAL_TABLET | Freq: Every day | ORAL | Status: DC

## 2011-11-18 MED ORDER — DOCUSATE SODIUM 100 MG PO CAPS
100.0000 mg | ORAL_CAPSULE | Freq: Two times a day (BID) | ORAL | Status: DC
  Administered 2011-11-18 – 2011-11-19 (×3): 100 mg via ORAL
  Filled 2011-11-18 (×2): qty 1

## 2011-11-18 MED ORDER — SODIUM CHLORIDE 0.9 % IV SOLN
INTRAVENOUS | Status: DC
  Administered 2011-11-18: 23:00:00 via INTRAVENOUS

## 2011-11-18 MED ORDER — DILTIAZEM HCL ER COATED BEADS 240 MG PO CP24
240.0000 mg | ORAL_CAPSULE | Freq: Every day | ORAL | Status: DC
  Administered 2011-11-18 – 2011-11-20 (×3): 240 mg via ORAL
  Filled 2011-11-18 (×4): qty 1

## 2011-11-18 MED ORDER — ONDANSETRON HCL 4 MG/2ML IJ SOLN: 4.0000 mg | Freq: Four times a day (QID) | INTRAMUSCULAR | Status: DC | PRN

## 2011-11-18 MED ORDER — CLOPIDOGREL BISULFATE 75 MG PO TABS
75.0000 mg | ORAL_TABLET | Freq: Every day | ORAL | Status: DC
  Administered 2011-11-19 – 2011-11-20 (×2): 75 mg via ORAL
  Filled 2011-11-18 (×2): qty 1

## 2011-11-18 MED ORDER — HYDRALAZINE HCL 20 MG/ML IJ SOLN
5.0000 mg | Freq: Four times a day (QID) | INTRAMUSCULAR | Status: DC | PRN
  Filled 2011-11-18: qty 0.25

## 2011-11-18 NOTE — Consult Note (Signed)
Referring Physician: Dr. Arthor Captain    Chief Complaint: Acute weakness involving left hand.  HPI: [name] is an 78 y.o. female history of stroke in 2007 affecting right upper extremity, hypertension and peptic ulcer disease, presenting with acute weakness involving left hand. Patient was last known normal at bedtime last night which was 10:30 PM. She woke up with weakness and inability to use her left hand. She had no changes in speech and no facial weakness. She also had no left lower extremity symptoms. Strength has improved since admission to the hospital. NIH stroke score at this point is 0. MRI of her brain showed a small right posterior frontal cortical stroke. Patient has been on aspirin and Plavix daily.  LSN: 10:30 PM 11/17/2011 tPA Given: No: Beyond time window for treatment consideration; mild deficits only.  MRankin: 0  Past Medical History  Diagnosis Date  . Adenomatous polyps   . Pyloric stenosis   . History of UTI   . Edema   . Effusion of ankle and foot joint   . Special screening for malignant neoplasm of colon   . Observation for suspected cardiovascular disease   . Other abnormal blood chemistry   . Cerebrovascular accident   . S/P total abdominal hysterectomy   . Hypertension   . Peptic ulcer disease   . GERD (gastroesophageal reflux disease)   . Cancer 1985    Cervical Ca-no radiation or chemo    Family History  Problem Relation Age of Onset  . Heart failure Mother   . Hypertension Mother   . Coronary artery disease Mother      Medications:  Prior to Admission:  Prescriptions prior to admission  Medication Sig Dispense Refill  . aspirin 325 MG tablet Take 650 mg by mouth daily.      Marland Kitchen aspirin 81 MG tablet Take 81 mg by mouth daily.        Marland Kitchen diltiazem (CARDIZEM CD) 240 MG 24 hr capsule TAKE 1 CAPSULE DAILY.  30 capsule  5  . docusate sodium (COLACE) 100 MG capsule Take 100 mg by mouth 2 (two) times daily.        . Multiple Vitamins-Minerals (CENTRUM  SILVER) tablet Take 1 tablet by mouth daily.        . naproxen sodium (ANAPROX) 220 MG tablet Take 220 mg by mouth every 6 (six) hours as needed.      Marland Kitchen omeprazole (PRILOSEC OTC) 20 MG tablet Take 20 mg by mouth daily.        Marland Kitchen PLAVIX 75 MG tablet TAKE (1) TABLET DAILY.  30 each  5   Scheduled:   . aspirin  81 mg Oral Daily  . clopidogrel  75 mg Oral Q breakfast  . diltiazem  240 mg Oral Daily  . docusate sodium  100 mg Oral BID  . multivitamin with minerals  1 tablet Oral Daily  . DISCONTD: CENTRUM SILVER  1 tablet Oral Daily   ZOX:[id number], ondansetron (ZOFRAN) IV   Physical Examination: Blood pressure 188/72, pulse 71, temperature 98.1 F (36.7 C), temperature source Oral, resp. rate 18, height 5\' 5"  (1.651 m), weight 77.5 kg (170 lb 13.7 oz), SpO2 99.00%.  Neurologic Examination: Mental Status: Alert, oriented, thought content appropriate.  Speech fluent without evidence of aphasia. Able to follow commands without difficulty. Cranial Nerves: II-Visual fields were normal. III/IV/VI-Pupils were equal and reacted. Extraocular movements were full and conjugate.    V/VII-no facial numbness and no facial weakness. VIII-normal. X-normal speech and  symmetrical palatal movement. Motor: 5/5 bilaterally with normal tone and bulk Sensory: Normal throughout. Deep Tendon Reflexes: 2+ and symmetric. Plantars: Mute bilaterally Cerebellar: Normal finger-to-nose testing. Carotid auscultation: Normal   Dg Chest 2 View  11/18/2011  *RADIOLOGY REPORT*  Clinical Data: Stroke.  CHEST - 2 VIEW  Comparison: 03/20/2006 and chest CT dated 06/10/2010.  Findings: Normal sized heart.  Small amount of linear density at the left lateral lung base.  Otherwise, clear lungs.  Mild diffuse peribronchial thickening without significant change.  Thoracic spine degenerative changes and mild scoliosis.  IMPRESSION:  1.  Stable mild chronic bronchitic changes. 2.  Interval minimal linear atelectasis or scarring at  the left lung base.   Original Report Authenticated By: Darrol Angel, M.D.    Ct Head Wo Contrast  11/18/2011  *RADIOLOGY REPORT*  Clinical Data:  Left arm weakness.  History of previous stroke.  CT HEAD WITHOUT CONTRAST CT CERVICAL SPINE WITHOUT CONTRAST  Technique:  Multidetector CT imaging of the head and cervical spine was performed following the standard protocol without intravenous contrast.  Multiplanar CT image reconstructions of the cervical spine were also generated.  Comparison:  MRI 06/11/2010  CT HEAD  Findings: The brain shows generalized atrophy.  There are ordinary mild chronic small vessel changes affecting the hemispheric white matter.  No sign of acute infarction, mass lesion, hemorrhage, hydrocephalus or extra-axial collection.  The calvarium is unremarkable.  Sinuses, middle ears and mastoids are clear.  IMPRESSION: Atrophy.  Chronic small vessel change.  No acute or reversible findings by CT.  CT CERVICAL SPINE  Findings: The foramen magnum is widely patent.  There is ordinary osteoarthritis at the articulation of the anterior arch of C1 and the dens.  No encroachment upon the neural spaces.  C2-3:  Facet arthropathy left worse than right.  No significant narrowing of the canal or foramina.  C3-4:  Bilateral facet arthropathy.  Degenerative spondylosis with endplate osteophytes.  Narrowing of the ventral subarachnoid space. No apparent compression of the cord.  Foraminal stenosis bilaterally that could effect either C4 nerve root.  C4-5:  Facet degeneration worse on the right.  Degenerative spondylosis with endplate osteophytes worse on the left.  Narrowing of the canal, left more than right.  Foraminal stenosis, left more than right.  Either C5 nerve root could be affected, more likely the left.  C5-6:  Facet degeneration on the left.  Degenerative spondylosis with endplate osteophytes.  Mild narrowing of the canal.  Foraminal encroachment bilaterally by osteophytes.  Either C6 nerve root  could be affected.  C6-7:  Facet degeneration worse on the left.  Degenerative spondylosis with endplate osteophytes and possible calcified herniated disc material.  Narrowing of the canal that could effect the cord.  Foraminal stenosis bilaterally that could effect either or both C7 nerve roots.  C7-T1:  Bilateral facet degeneration with 2 mm of anterolisthesis. Canal and foramina appear sufficiently patent.  IMPRESSION: No acute or traumatic finding.  Advanced degenerative facet arthropathy and spondylosis as above.  Multiple foraminal stenoses could effect multiple cervical nerve roots.  Canal narrowing on the left at C4-5 and centrally at C6-7 could possibly affect the cord.   Original Report Authenticated By: Thomasenia Sales, M.D.    Ct Cervical Spine Wo Contrast  11/18/2011  *RADIOLOGY REPORT*  Clinical Data:  Left arm weakness.  History of previous stroke.  CT HEAD WITHOUT CONTRAST CT CERVICAL SPINE WITHOUT CONTRAST  Technique:  Multidetector CT imaging of the head and cervical spine  was performed following the standard protocol without intravenous contrast.  Multiplanar CT image reconstructions of the cervical spine were also generated.  Comparison:  MRI 06/11/2010  CT HEAD  Findings: The brain shows generalized atrophy.  There are ordinary mild chronic small vessel changes affecting the hemispheric white matter.  No sign of acute infarction, mass lesion, hemorrhage, hydrocephalus or extra-axial collection.  The calvarium is unremarkable.  Sinuses, middle ears and mastoids are clear.  IMPRESSION: Atrophy.  Chronic small vessel change.  No acute or reversible findings by CT.  CT CERVICAL SPINE  Findings: The foramen magnum is widely patent.  There is ordinary osteoarthritis at the articulation of the anterior arch of C1 and the dens.  No encroachment upon the neural spaces.  C2-3:  Facet arthropathy left worse than right.  No significant narrowing of the canal or foramina.  C3-4:  Bilateral facet  arthropathy.  Degenerative spondylosis with endplate osteophytes.  Narrowing of the ventral subarachnoid space. No apparent compression of the cord.  Foraminal stenosis bilaterally that could effect either C4 nerve root.  C4-5:  Facet degeneration worse on the right.  Degenerative spondylosis with endplate osteophytes worse on the left.  Narrowing of the canal, left more than right.  Foraminal stenosis, left more than right.  Either C5 nerve root could be affected, more likely the left.  C5-6:  Facet degeneration on the left.  Degenerative spondylosis with endplate osteophytes.  Mild narrowing of the canal.  Foraminal encroachment bilaterally by osteophytes.  Either C6 nerve root could be affected.  C6-7:  Facet degeneration worse on the left.  Degenerative spondylosis with endplate osteophytes and possible calcified herniated disc material.  Narrowing of the canal that could effect the cord.  Foraminal stenosis bilaterally that could effect either or both C7 nerve roots.  C7-T1:  Bilateral facet degeneration with 2 mm of anterolisthesis. Canal and foramina appear sufficiently patent.  IMPRESSION: No acute or traumatic finding.  Advanced degenerative facet arthropathy and spondylosis as above.  Multiple foraminal stenoses could effect multiple cervical nerve roots.  Canal narrowing on the left at C4-5 and centrally at C6-7 could possibly affect the cord.   Original Report Authenticated By: Thomasenia Sales, M.D.    Mr Brain Wo Contrast  11/18/2011  *RADIOLOGY REPORT*  Clinical Data: Unable to straighten two fingers of left hand.  Left arm is weak.  History of stroke and high blood pressure.  Uterine cancer.  MRI HEAD WITHOUT CONTRAST  Technique:  Multiplanar, multiecho pulse sequences of the brain and surrounding structures were obtained according to standard protocol without intravenous contrast.  Comparison: 11/18/2011 head CT.  06/11/2010 brain MR.  Findings: Acute small non hemorrhagic infarct posterior right  frontal lobe (motor strip).  Moderate small vessel disease type changes.  No intracranial hemorrhage.  No intracranial mass lesion detected on this unenhanced exam.  Global atrophy without hydrocephalus.  Major intracranial vascular structures are patent.  Mild cervical spondylotic changes.  IMPRESSION: Acute small non hemorrhagic infarct posterior right frontal lobe (motor strip).  Moderate small vessel disease type changes.  Global atrophy without hydrocephalus.  Critical Value/emergent results were called by telephone at the time of interpretation on 11/18/2011 at 2:23 p.m. to Dr. Lynelle Doctor.  , who verbally acknowledged these results.   Original Report Authenticated By: Fuller Canada, M.D.     Assessment: 78 y.o. female presenting with acute recurrent small vessel right cerebral infarction. Weakness of left hand has resolved.  Stroke Risk Factors - family history and hypertension  Plan: 1. HgbA1c, fasting lipid panel 2. MRA  of the brain without contrast 3. PT consult, OT consult, Speech consult 4. Echocardiogram 5. Carotid dopplers 6. Prophylactic therapy-Antiplatelet med: Aspirin and Plavix  7. Risk factor modification 8. Telemetry monitoring  C.R. Roseanne Reno, MD [facility] [phone]  11/18/2011, 10:20 PM

## 2011-11-18 NOTE — ED Notes (Addendum)
Pt presents to the ED with c/o left arm weakness since this 630a today.  Pt has hx of stroke in 2005. Reports taking 2 aspirins this am.

## 2011-11-18 NOTE — H&P (Signed)
[facility] History and Physical  CAMMI CONSALVO ZOX:[id number] DOB: [birth year] DOA: 11/18/2011  Referring physician: Ward Givens, MD   PCP: Illene Regulus, MD   Chief Complaint: Acute CVA  HPI: [name] is a 78 y.o. female with past medical history of previous stroke in 2007, hypertension and peptic ulcer disease. She came in to the [facility] long emergency department complaining about left upper extremity weakness and numbness. Patient was in her usual state of health till last night, when she woke up this morning she experienced previous mentioned symptoms. She said she couldn't use her left arm, she had a similar episode before and when she was evaluated in the hospital turned out to be a stroke. Patient called her neighbor who drove her to the emergency department. She denies lower extremity weakness, her neighbor denies any change in her facial appearance of any dysarthria. Upon initial evaluation in the emergency department MRI of the brain was done and showed acute stroke in the right frontal lobe. Neurology was consulted and patient to go to [facility] for further evaluation and management.  Patient experienced the symptoms when she woke up, presents to the ED around 11 AM, no TPA given because of the exact onset of symptoms is unknown likely while she was sleeping.  Review of Systems:  Constitutional: negative for anorexia, fevers and sweats Eyes: negative for irritation, redness and visual disturbance Ears, nose, mouth, throat, and face: negative for earaches, epistaxis, nasal congestion and sore throat Respiratory: negative for cough, dyspnea on exertion, sputum and wheezing Cardiovascular: negative for chest pain, dyspnea, lower extremity edema, orthopnea, palpitations and syncope Gastrointestinal: negative for abdominal pain, constipation, diarrhea, melena, nausea and vomiting Genitourinary:negative for dysuria, frequency and hematuria Hematologic/lymphatic: negative for  bleeding, easy bruising and lymphadenopathy Musculoskeletal:negative for arthralgias, muscle weakness and stiff joints Neurological: per HPI. Endocrine: negative for diabetic symptoms including polydipsia, polyuria and weight loss Allergic/Immunologic: negative for anaphylaxis, hay fever and urticaria  Past Medical History  Diagnosis Date  . Adenomatous polyps   . Pyloric stenosis   . History of UTI   . Edema   . Effusion of ankle and foot joint   . Special screening for malignant neoplasm of colon   . Observation for suspected cardiovascular disease   . Other abnormal blood chemistry   . Cerebrovascular accident   . S/P total abdominal hysterectomy   . Hypertension   . Peptic ulcer disease   . GERD (gastroesophageal reflux disease)   . Cancer 1985    Cervical Ca-no radiation or chemo   Past Surgical History  Procedure Date  . Cyst on right hand removed   . Cataract extraction 2008    Dr Dagoberto Ligas  . Abdominal hysterectomy    Social History:  reports that she quit smoking about 63 years ago. She has never used smokeless tobacco. She reports that she drinks alcohol. She reports that she does not use illicit drugs. She is very ambulatory, she still plays golf.  No Known Allergies  Family History  Problem Relation Age of Onset  . Heart failure Mother   . Hypertension Mother   . Coronary artery disease Mother     Prior to Admission medications   Medication Sig Start Date End Date Taking? Authorizing Provider  aspirin 325 MG tablet Take 650 mg by mouth daily.   Yes Historical Provider, MD  aspirin 81 MG tablet Take 81 mg by mouth daily.     Yes Historical Provider, MD  diltiazem (CARDIZEM  CD) 240 MG 24 hr capsule TAKE 1 CAPSULE DAILY. 08/26/11  Yes Jacques Navy, MD  docusate sodium (COLACE) 100 MG capsule Take 100 mg by mouth 2 (two) times daily.     Yes Historical Provider, MD  Multiple Vitamins-Minerals (CENTRUM SILVER) tablet Take 1 tablet by mouth daily.     Yes  Historical Provider, MD  naproxen sodium (ANAPROX) 220 MG tablet Take 220 mg by mouth every 6 (six) hours as needed.   Yes Historical Provider, MD  omeprazole (PRILOSEC OTC) 20 MG tablet Take 20 mg by mouth daily.     Yes Historical Provider, MD  PLAVIX 75 MG tablet TAKE (1) TABLET DAILY. 08/26/11  Yes Jacques Navy, MD   Physical Exam: Filed Vitals:   11/18/11 1430 11/18/11 1500 11/18/11 1520 11/18/11 1600  BP: 148/81 163/79  199/76  Pulse: 74 71  69  Temp:   98.7 F (37.1 C)   Resp: 17 12  17   SpO2: 95% 99%  99%   General appearance: alert, cooperative and no distress  Head: Normocephalic, without obvious abnormality, atraumatic  Eyes: conjunctivae/corneas clear. PERRL, EOM's intact. Fundi benign.  Nose: Nares normal. Septum midline. Mucosa normal. No drainage or sinus tenderness.  Throat: lips, mucosa, and tongue normal; teeth and gums normal  Neck: Supple, no masses, no cervical lymphadenopathy, no JVD appreciated, no meningeal signs Resp: clear to auscultation bilaterally  Chest wall: no tenderness  Cardio: regular rate and rhythm, S1, S2 normal, no murmur, click, rub or gallop  GI: soft, non-tender; bowel sounds normal; no masses, no organomegaly  Extremities: extremities normal, atraumatic, no cyanosis or edema  Skin: Skin color, texture, turgor normal. No rashes or lesions  Neurologic: Alert and oriented X 3, normal strength and tone. Normal symmetric reflexes. Normal coordination and gait, I failed to pinpoint any abnormality in her neuro examination  Labs on Admission:  Basic Metabolic Panel:  Lab 11/18/11 4132  NA 138  K 4.1  CL 101  CO2 26  GLUCOSE 127*  BUN 17  CREATININE 0.86  CALCIUM 9.5  MG --  PHOS --   Liver Function Tests:  Lab 11/18/11 1142  AST 17  ALT 17  ALKPHOS 144*  BILITOT 0.2*  PROT 7.4  ALBUMIN 3.8   No results found for this basename: LIPASE:5,AMYLASE:5 in the last 168 hours No results found for this basename: AMMONIA:5 in the last  168 hours CBC:  Lab 11/18/11 1142  WBC 7.2  NEUTROABS 5.0  HGB 15.2*  HCT 43.9  MCV 88.2  PLT 338   Cardiac Enzymes: No results found for this basename: CKTOTAL:5,CKMB:5,CKMBINDEX:5,TROPONINI:5 in the last 168 hours  BNP (last 3 results) No results found for this basename: PROBNP:3 in the last 8760 hours CBG: No results found for this basename: GLUCAP:5 in the last 168 hours  Radiological Exams on Admission: Ct Head Wo Contrast  11/18/2011  *RADIOLOGY REPORT*  Clinical Data:  Left arm weakness.  History of previous stroke.  CT HEAD WITHOUT CONTRAST CT CERVICAL SPINE WITHOUT CONTRAST  Technique:  Multidetector CT imaging of the head and cervical spine was performed following the standard protocol without intravenous contrast.  Multiplanar CT image reconstructions of the cervical spine were also generated.  Comparison:  MRI 06/11/2010  CT HEAD  Findings: The brain shows generalized atrophy.  There are ordinary mild chronic small vessel changes affecting the hemispheric white matter.  No sign of acute infarction, mass lesion, hemorrhage, hydrocephalus or extra-axial collection.  The calvarium is unremarkable.  Sinuses, middle ears and mastoids are clear.  IMPRESSION: Atrophy.  Chronic small vessel change.  No acute or reversible findings by CT.  CT CERVICAL SPINE  Findings: The foramen magnum is widely patent.  There is ordinary osteoarthritis at the articulation of the anterior arch of C1 and the dens.  No encroachment upon the neural spaces.  C2-3:  Facet arthropathy left worse than right.  No significant narrowing of the canal or foramina.  C3-4:  Bilateral facet arthropathy.  Degenerative spondylosis with endplate osteophytes.  Narrowing of the ventral subarachnoid space. No apparent compression of the cord.  Foraminal stenosis bilaterally that could effect either C4 nerve root.  C4-5:  Facet degeneration worse on the right.  Degenerative spondylosis with endplate osteophytes worse on the left.   Narrowing of the canal, left more than right.  Foraminal stenosis, left more than right.  Either C5 nerve root could be affected, more likely the left.  C5-6:  Facet degeneration on the left.  Degenerative spondylosis with endplate osteophytes.  Mild narrowing of the canal.  Foraminal encroachment bilaterally by osteophytes.  Either C6 nerve root could be affected.  C6-7:  Facet degeneration worse on the left.  Degenerative spondylosis with endplate osteophytes and possible calcified herniated disc material.  Narrowing of the canal that could effect the cord.  Foraminal stenosis bilaterally that could effect either or both C7 nerve roots.  C7-T1:  Bilateral facet degeneration with 2 mm of anterolisthesis. Canal and foramina appear sufficiently patent.  IMPRESSION: No acute or traumatic finding.  Advanced degenerative facet arthropathy and spondylosis as above.  Multiple foraminal stenoses could effect multiple cervical nerve roots.  Canal narrowing on the left at C4-5 and centrally at C6-7 could possibly affect the cord.   Original Report Authenticated By: Thomasenia Sales, M.D.    Ct Cervical Spine Wo Contrast  11/18/2011  *RADIOLOGY REPORT*  Clinical Data:  Left arm weakness.  History of previous stroke.  CT HEAD WITHOUT CONTRAST CT CERVICAL SPINE WITHOUT CONTRAST  Technique:  Multidetector CT imaging of the head and cervical spine was performed following the standard protocol without intravenous contrast.  Multiplanar CT image reconstructions of the cervical spine were also generated.  Comparison:  MRI 06/11/2010  CT HEAD  Findings: The brain shows generalized atrophy.  There are ordinary mild chronic small vessel changes affecting the hemispheric white matter.  No sign of acute infarction, mass lesion, hemorrhage, hydrocephalus or extra-axial collection.  The calvarium is unremarkable.  Sinuses, middle ears and mastoids are clear.  IMPRESSION: Atrophy.  Chronic small vessel change.  No acute or reversible  findings by CT.  CT CERVICAL SPINE  Findings: The foramen magnum is widely patent.  There is ordinary osteoarthritis at the articulation of the anterior arch of C1 and the dens.  No encroachment upon the neural spaces.  C2-3:  Facet arthropathy left worse than right.  No significant narrowing of the canal or foramina.  C3-4:  Bilateral facet arthropathy.  Degenerative spondylosis with endplate osteophytes.  Narrowing of the ventral subarachnoid space. No apparent compression of the cord.  Foraminal stenosis bilaterally that could effect either C4 nerve root.  C4-5:  Facet degeneration worse on the right.  Degenerative spondylosis with endplate osteophytes worse on the left.  Narrowing of the canal, left more than right.  Foraminal stenosis, left more than right.  Either C5 nerve root could be affected, more likely the left.  C5-6:  Facet degeneration on the left.  Degenerative spondylosis with endplate osteophytes.  Mild narrowing of the canal.  Foraminal encroachment bilaterally by osteophytes.  Either C6 nerve root could be affected.  C6-7:  Facet degeneration worse on the left.  Degenerative spondylosis with endplate osteophytes and possible calcified herniated disc material.  Narrowing of the canal that could effect the cord.  Foraminal stenosis bilaterally that could effect either or both C7 nerve roots.  C7-T1:  Bilateral facet degeneration with 2 mm of anterolisthesis. Canal and foramina appear sufficiently patent.  IMPRESSION: No acute or traumatic finding.  Advanced degenerative facet arthropathy and spondylosis as above.  Multiple foraminal stenoses could effect multiple cervical nerve roots.  Canal narrowing on the left at C4-5 and centrally at C6-7 could possibly affect the cord.   Original Report Authenticated By: Thomasenia Sales, M.D.    Mr Brain Wo Contrast  11/18/2011  *RADIOLOGY REPORT*  Clinical Data: Unable to straighten two fingers of left hand.  Left arm is weak.  History of stroke and high  blood pressure.  Uterine cancer.  MRI HEAD WITHOUT CONTRAST  Technique:  Multiplanar, multiecho pulse sequences of the brain and surrounding structures were obtained according to standard protocol without intravenous contrast.  Comparison: 11/18/2011 head CT.  06/11/2010 brain MR.  Findings: Acute small non hemorrhagic infarct posterior right frontal lobe (motor strip).  Moderate small vessel disease type changes.  No intracranial hemorrhage.  No intracranial mass lesion detected on this unenhanced exam.  Global atrophy without hydrocephalus.  Major intracranial vascular structures are patent.  Mild cervical spondylotic changes.  IMPRESSION: Acute small non hemorrhagic infarct posterior right frontal lobe (motor strip).  Moderate small vessel disease type changes.  Global atrophy without hydrocephalus.  Critical Value/emergent results were called by telephone at the time of interpretation on 11/18/2011 at 2:23 p.m. to Dr. Lynelle Doctor.  , who verbally acknowledged these results.   Original Report Authenticated By: Fuller Canada, M.D.     EKG: Independently reviewed.  Assessment/Plan Principal Problem:  *Cerebrovascular accident Active Problems:  HYPERTENSION  GERD  PEPTIC ULCER DISEASE  Elevated troponin   CVA Patient is on aspirin and Plavix, neurology service consulted. Patient will be admitted to neuro telemetry floor for further evaluation and management, MRI was done in with a long emergency department, we will obtain MRA of the brain vessels, carotid duplex of neck vessels, 2-D echocardiogram. Patient will be on telemetry to rule out any arrhythmias. Neurology for anticoagulation recommendation.  Hypertension Accelerated hypertension, this is secondary to her acute stroke, systolic blood pressure went up to 200, I will not control the blood pressure tightly, any episodes of hypotension can cause increase in the stroke and the penumbra.  Elevated troponins This is likely secondary to the stroke  in the accelerated hypertension. They both known to increase cardiac enzymes. Patient denies any chest pain, her 12-lead EKG did not show any changes consistent with ACS. I will follow closely, cycle 3 sets of cardiac enzymes and repeat EKG in the morning. If patient develops any chest pain, cardiology consultation will be warranted.  GERD Patient is on omeprazole, it has been reported that omeprazole interferes with Plavix, this is being switched to pantoprazole.  Code Status: Full code Family Communication: Patient accompanied by her neighbor, plan explained to both. Disposition Plan: To [facility], to neurologic telemetry floor  Time spent: 70 minutes  [facility] Pager [phone]  If 7PM-7AM, please contact night-coverage [email or web address] Password [facility] 11/18/2011, 4:49 PM

## 2011-11-18 NOTE — ED Notes (Signed)
MD at bedside.  Dr knapp 

## 2011-11-18 NOTE — ED Notes (Signed)
Dr. Lynelle Santos is aware of the critical lab value of the istat tropoinin.

## 2011-11-18 NOTE — ED Notes (Signed)
Pt returned from MRI °

## 2011-11-18 NOTE — ED Notes (Signed)
Called to give report, nurse unavailable, will call back

## 2011-11-18 NOTE — ED Notes (Signed)
Patient transported to MRI 

## 2011-11-18 NOTE — ED Notes (Signed)
Pt waiting for transport by [facility]

## 2011-11-18 NOTE — ED Provider Notes (Signed)
History     CSN: [id number]  Arrival date & time 11/18/11  1053   First MD Initiated Contact with Patient 11/18/11 1102      Chief Complaint  Patient presents with  . Extremity Weakness    (Consider location/radiation/quality/duration/timing/severity/associated sxs/prior treatment) HPI  Patient reports when she woke up this morning she was having weakness in her left little and left ring fingers. She's having difficulty flexing the fingers. She denies any numbness in her extremities including her feet. She states she was able to walk normally without being off balance. She states she has a mild diffuse headache. She denies any pain in her neck. She denies any visual changes. She has some nausea without vomiting. She denies any recent trauma. Patient relates in about 2006 she had weakness in her left upper extremity and had difficulty abducting her arm however that result in one day and she was diagnosed for stroke. She was started on Plavix and a baby aspirin daily at that point.  PCP Dr. Debby Bud  Past Medical History  Diagnosis Date  . Adenomatous polyps   . Pyloric stenosis   . History of UTI   . Edema   . Effusion of ankle and foot joint   . Special screening for malignant neoplasm of colon   . Observation for suspected cardiovascular disease   . Other abnormal blood chemistry   . Cerebrovascular accident   . S/P total abdominal hysterectomy   . Hypertension   . Peptic ulcer disease   . GERD (gastroesophageal reflux disease)   . Cancer 1985    Cervical Ca-no radiation or chemo    Past Surgical History  Procedure Date  . Cyst on right hand removed   . Cataract extraction 2008    Dr Dagoberto Ligas  . Abdominal hysterectomy     Family History  Problem Relation Age of Onset  . Heart failure Mother   . Hypertension Mother   . Coronary artery disease Mother     History  Substance Use Topics  . Smoking status: Former Smoker    Quit date: 03/11/1948  . Smokeless  tobacco: Never Used  . Alcohol Use: Yes     Occasional wine, not very often  Lives alone Lives with spouse   OB History    Grav Para Term Preterm Abortions TAB SAB Ect Mult Living                  Review of Systems  All other systems reviewed and are negative.    Allergies  Review of patient's allergies indicates no known allergies.  Home Medications   Current Outpatient Rx  Name Route Sig Dispense Refill  . ASPIRIN 325 MG PO TABS Oral Take 650 mg by mouth daily.    . ASPIRIN 81 MG PO TABS Oral Take 81 mg by mouth daily.      Marland Kitchen DILTIAZEM HCL ER COATED BEADS 240 MG PO CP24  TAKE 1 CAPSULE DAILY. 30 capsule 5  . DOCUSATE SODIUM 100 MG PO CAPS Oral Take 100 mg by mouth 2 (two) times daily.      . CENTRUM SILVER PO TABS Oral Take 1 tablet by mouth daily.      Marland Kitchen NAPROXEN SODIUM 220 MG PO TABS Oral Take 220 mg by mouth every 6 (six) hours as needed.    Marland Kitchen OMEPRAZOLE MAGNESIUM 20 MG PO TBEC Oral Take 20 mg by mouth daily.      Marland Kitchen PLAVIX 75 MG PO TABS  TAKE (  1) TABLET DAILY. 30 each 5    BP 177/89  Pulse 66  Resp 15  SpO2 95%  Vital signs normal except hypertension   Physical Exam  Nursing note and vitals reviewed. Constitutional: She is oriented to person, place, and time. She appears well-developed and well-nourished.  Non-toxic appearance. She does not appear ill. No distress.  HENT:  Head: Normocephalic and atraumatic.  Right Ear: External ear normal.  Left Ear: External ear normal.  Nose: Nose normal. No mucosal edema or rhinorrhea.  Mouth/Throat: Oropharynx is clear and moist and mucous membranes are normal. No dental abscesses or uvula swelling.  Eyes: Conjunctivae and EOM are normal. Pupils are equal, round, and reactive to light.  Neck: Normal range of motion and full passive range of motion without pain. Neck supple.  Cardiovascular: Normal rate and regular rhythm.  Exam reveals no gallop and no friction rub.   Murmur heard.      Patient has a midsystolic  blowing type murmur that is faintly heard diffusely but is loudest over the right upper sternal border  Pulmonary/Chest: Effort normal and breath sounds normal. No respiratory distress. She has no wheezes. She has no rhonchi. She has no rales. She exhibits no tenderness and no crepitus.  Abdominal: Soft. Normal appearance and bowel sounds are normal. She exhibits no distension. There is no tenderness. There is no rebound and no guarding.  Musculoskeletal: Normal range of motion. She exhibits no edema and no tenderness.       Moves all extremities well.   Patient appears to be holding her left little and left ring fingers and mild flexion she has difficulty extending the fingers.  Neurological: She is alert and oriented to person, place, and time. She has normal strength. No cranial nerve deficit.       Patient has no pronator drift. She has mild weakness in her left grip but that is consistent with her being right-handed, she has no weakness in her extremities.  Skin: Skin is warm, dry and intact. No rash noted. No erythema. No pallor.  Psychiatric: She has a normal mood and affect. Her speech is normal and behavior is normal. Her mood appears not anxious.    ED Course  Procedures (including critical care time)  Recheck patient reports her weakness in her fingers are improving and she is now able to flex and extend her fingers normally. Her i-STAT troponin was abnormal, however patient denies any chest pain or shortness of breath. MRI of the brain ordered.  14:22 Dr Constance Goltz radiologist called acute MR results.   Repeat Troponin about the same as the first so is not c/w acute cardiac issue. May be elevated b/o her acute stroke.  Results given to patient who now has full use of her hand.  Review of chart show patient was actually admitted March 2012 for a possible TIA when she had visual field cuts.  15:22 Dr Amada Jupiter feels needs to be admitted at [facility] by hospitalist for further evaluation. He  will consult.   15:32 Toya Smothers, NP admit to 4N at [facility], to team 10, admitting Dr Chancy Milroy  Results for orders placed during the hospital encounter of 11/18/11  CBC WITH DIFFERENTIAL      Component Value Range   WBC 7.2  4.0 - 10.5 K/uL   RBC 4.98  3.87 - 5.11 MIL/uL   Hemoglobin 15.2 (*) 12.0 - 15.0 g/dL   HCT 91.4  78.2 - 95.6 %   MCV 88.2  78.0 -  100.0 fL   MCH 30.5  26.0 - 34.0 pg   MCHC 34.6  30.0 - 36.0 g/dL   RDW 40.9  81.1 - 91.4 %   Platelets 338  150 - 400 K/uL   Neutrophils Relative 70  43 - 77 %   Neutro Abs 5.0  1.7 - 7.7 K/uL   Lymphocytes Relative 22  12 - 46 %   Lymphs Abs 1.6  0.7 - 4.0 K/uL   Monocytes Relative 6  3 - 12 %   Monocytes Absolute 0.4  0.1 - 1.0 K/uL   Eosinophils Relative 1  0 - 5 %   Eosinophils Absolute 0.1  0.0 - 0.7 K/uL   Basophils Relative 0  0 - 1 %   Basophils Absolute 0.0  0.0 - 0.1 K/uL  COMPREHENSIVE METABOLIC PANEL      Component Value Range   Sodium 138  135 - 145 mEq/L   Potassium 4.1  3.5 - 5.1 mEq/L   Chloride 101  96 - 112 mEq/L   CO2 26  19 - 32 mEq/L   Glucose, Bld 127 (*) 70 - 99 mg/dL   BUN 17  6 - 23 mg/dL   Creatinine, Ser 7.82  0.50 - 1.10 mg/dL   Calcium 9.5  8.4 - 95.6 mg/dL   Total Protein 7.4  6.0 - 8.3 g/dL   Albumin 3.8  3.5 - 5.2 g/dL   AST 17  0 - 37 U/L   ALT 17  0 - 35 U/L   Alkaline Phosphatase 144 (*) 39 - 117 U/L   Total Bilirubin 0.2 (*) 0.3 - 1.2 mg/dL   GFR calc non Af Amer 59 (*) >90 mL/min   GFR calc Af Amer 69 (*) >90 mL/min  APTT      Component Value Range   aPTT 30  24 - 37 seconds  PROTIME-INR      Component Value Range   Prothrombin Time 13.9  11.6 - 15.2 seconds   INR 1.05  0.00 - 1.49  POCT I-STAT TROPONIN I      Component Value Range   Troponin i, poc 0.17 (*) 0.00 - 0.08 ng/mL   Comment NOTIFIED PHYSICIAN     Comment 3           POCT I-STAT TROPONIN I      Component Value Range   Troponin i, poc 0.13 (*) 0.00 - 0.08 ng/mL   Comment 3            Laboratory interpretation  all normal except elevated troponin that remained essentially the same on second set.    Ct Head Wo Contrast  Ct Cervical Spine Wo Contrast  11/18/2011  *RADIOLOGY REPORT*  Clinical Data:  Left arm weakness.  History of previous stroke.  CT HEAD WITHOUT CONTRAST CT CERVICAL SPINE WITHOUT CONTRAST  Technique:  Multidetector CT imaging of the head and cervical spine was performed following the standard protocol without intravenous contrast.  Multiplanar CT image reconstructions of the cervical spine were also generated.  Comparison:  MRI 06/11/2010  CT HEAD  Findings: The brain shows generalized atrophy.  There are ordinary mild chronic small vessel changes affecting the hemispheric white matter.  No sign of acute infarction, mass lesion, hemorrhage, hydrocephalus or extra-axial collection.  The calvarium is unremarkable.  Sinuses, middle ears and mastoids are clear.  IMPRESSION: Atrophy.  Chronic small vessel change.  No acute or reversible findings by CT.  CT CERVICAL SPINE  Findings: The foramen  magnum is widely patent.  There is ordinary osteoarthritis at the articulation of the anterior arch of C1 and the dens.  No encroachment upon the neural spaces.  C2-3:  Facet arthropathy left worse than right.  No significant narrowing of the canal or foramina.  C3-4:  Bilateral facet arthropathy.  Degenerative spondylosis with endplate osteophytes.  Narrowing of the ventral subarachnoid space. No apparent compression of the cord.  Foraminal stenosis bilaterally that could effect either C4 nerve root.  C4-5:  Facet degeneration worse on the right.  Degenerative spondylosis with endplate osteophytes worse on the left.  Narrowing of the canal, left more than right.  Foraminal stenosis, left more than right.  Either C5 nerve root could be affected, more likely the left.  C5-6:  Facet degeneration on the left.  Degenerative spondylosis with endplate osteophytes.  Mild narrowing of the canal.  Foraminal encroachment  bilaterally by osteophytes.  Either C6 nerve root could be affected.  C6-7:  Facet degeneration worse on the left.  Degenerative spondylosis with endplate osteophytes and possible calcified herniated disc material.  Narrowing of the canal that could effect the cord.  Foraminal stenosis bilaterally that could effect either or both C7 nerve roots.  C7-T1:  Bilateral facet degeneration with 2 mm of anterolisthesis. Canal and foramina appear sufficiently patent.  IMPRESSION: No acute or traumatic finding.  Advanced degenerative facet arthropathy and spondylosis as above.  Multiple foraminal stenoses could effect multiple cervical nerve roots.  Canal narrowing on the left at C4-5 and centrally at C6-7 could possibly affect the cord.   Original Report Authenticated By: Thomasenia Sales, M.D.    Mr Brain Wo Contrast  11/18/2011  *RADIOLOGY REPORT*  Clinical Data: Unable to straighten two fingers of left hand.  Left arm is weak.  History of stroke and high blood pressure.  Uterine cancer.  MRI HEAD WITHOUT CONTRAST  Technique:  Multiplanar, multiecho pulse sequences of the brain and surrounding structures were obtained according to standard protocol without intravenous contrast.  Comparison: 11/18/2011 head CT.  06/11/2010 brain MR.  Findings: Acute small non hemorrhagic infarct posterior right frontal lobe (motor strip).  Moderate small vessel disease type changes.  No intracranial hemorrhage.  No intracranial mass lesion detected on this unenhanced exam.  Global atrophy without hydrocephalus.  Major intracranial vascular structures are patent.  Mild cervical spondylotic changes.  IMPRESSION: Acute small non hemorrhagic infarct posterior right frontal lobe (motor strip).  Moderate small vessel disease type changes.  Global atrophy without hydrocephalus.  Critical Value/emergent results were called by telephone at the time of interpretation on 11/18/2011 at 2:23 p.m. to Dr. Lynelle Doctor.  , who verbally acknowledged these results.    Original Report Authenticated By: Fuller Canada, M.D.      Date: 11/18/2011  Rate: 66  Rhythm: normal sinus rhythm  QRS Axis: normal  Intervals: normal  ST/T Wave abnormalities: normal  Conduction Disutrbances:none  Narrative Interpretation:   Old EKG Reviewed: unchanged from 06/04/2010     1. Stroke     Transfer to [facility] for admission  Devoria Albe, MD, FACEP   MDM            Ward Givens, MD 11/18/11 [phone]

## 2011-11-18 NOTE — ED Notes (Signed)
D

## 2011-11-18 NOTE — ED Notes (Signed)
Critical i stat -  Traunda informed Dr Lynelle Doctor and gave her the paper.

## 2011-11-18 NOTE — ED Notes (Signed)
Report given to [facility] RN. 

## 2011-11-19 ENCOUNTER — Inpatient Hospital Stay (HOSPITAL_COMMUNITY): Payer: Medicare Other

## 2011-11-19 DIAGNOSIS — R7989 Other specified abnormal findings of blood chemistry: Secondary | ICD-10-CM

## 2011-11-19 LAB — HEMOGLOBIN A1C
Hgb A1c MFr Bld: 6.2 % — ABNORMAL HIGH (ref <5.7)
Mean Plasma Glucose: 131 mg/dL — ABNORMAL HIGH (ref <117)

## 2011-11-19 LAB — LIPID PANEL
HDL: 66 mg/dL (ref >39)
LDL Cholesterol: 81 mg/dL (ref 0–99)
Triglycerides: 203 mg/dL — ABNORMAL HIGH (ref <150)

## 2011-11-19 LAB — TROPONIN I: Troponin I: <0.3 ng/mL (ref <0.30)

## 2011-11-19 NOTE — Progress Notes (Signed)
HETTY LINHART 1922-04-04 [id number]   Brief note following transfer to [facility]  S: No complaints. Feeling better with left hand weakness now resolved.  O: Filed Vitals:   11/18/11 1920 11/18/11 2100 11/18/11 2200 11/19/11 0000  BP: 162/82 188/72 152/54 133/44  Pulse:  71 66 62  Temp: 98.1 F (36.7 C) 98.1 F (36.7 C) 98.1 F (36.7 C) 98.2 F (36.8 C)  TempSrc: Oral Oral Oral Oral  Resp: 17 18 18 18   Height:  5\' 5"  (1.651 m)    Weight:  77.5 kg (170 lb 13.7 oz)    SpO2: 97% 99% 97% 97%   CBC    Component Value Date/Time   WBC 7.2 11/18/2011 1142   RBC 4.98 11/18/2011 1142   HGB 15.2* 11/18/2011 1142   HCT 43.9 11/18/2011 1142   PLT 338 11/18/2011 1142   MCV 88.2 11/18/2011 1142   MCH 30.5 11/18/2011 1142   MCHC 34.6 11/18/2011 1142   RDW 12.6 11/18/2011 1142   LYMPHSABS 1.6 11/18/2011 1142   MONOABS 0.4 11/18/2011 1142   EOSABS 0.1 11/18/2011 1142   BASOSABS 0.0 11/18/2011 1142    BMET    Component Value Date/Time   NA 138 11/18/2011 1142   K 4.1 11/18/2011 1142   CL 101 11/18/2011 1142   CO2 26 11/18/2011 1142   GLUCOSE 127* 11/18/2011 1142   BUN 17 11/18/2011 1142   CREATININE 0.86 11/18/2011 1142   CALCIUM 9.5 11/18/2011 1142   GFRNONAA 59* 11/18/2011 1142   GFRAA 69* 11/18/2011 1142    Troponin (Point of Care Test)  Basename 11/18/11 1340  TROPIPOC 0.13*   Gen: awake, alert in NAD CV: S1S2 RRR, no m/r/g Resp: CTA, no w/r/c, no increased wob GI: abdomen soft, NT/ND, BS+ Neuro: no focal findings on exam Psych: very pleasant mood and affect  A/P: 1. Acute posterior right frontal lobe infarct: S/p neuro consult. Continue full CVA workup to include echo, carotid dopplers and MRA brain.   2. Elevated trop I: Continues to deny any cardiac complaints. EKG with no evidence of ACS. Could be due to above +/- htn (now resolved). RN to ensure repeat troponins are obtained to ensure downward trend.   Other issues as discussed in admission note.   Cordelia Pen, NP-C [facility]  Service [facility]  pgr [phone]

## 2011-11-19 NOTE — Evaluation (Signed)
Physical Therapy Evaluation Patient Details Name: [name] MRN: [id number] DOB: 1922-09-14 Today's Date: 11/19/2011 Time: 0955-1010 PT Time Calculation (min): 15 min  PT Assessment / Plan / Recommendation Clinical Impression  Pt is a 78 yo female admitted with left hand weakness and per pt's chart, acute small non hemorrhagic infarct posterior right frontal lobe (motor strip). Pt presented to evalutaion with decreased strength in bil UE and decreased balance and will benefit from physical therapy in the acute care setting to maximize independence prior to dsicharge. Pt reports that LUE feels back at baseline, however mild strength deficits bil UE. Pt scored 19/24 on DGI, indicating increased fall risk. Recommending outpatient PT upon discharge, however when discussed with pt, pt refused due to pt reporting she feels she is back at her baseline.    PT Assessment  Patient needs continued PT services    Follow Up Recommendations  Outpatient PT    Barriers to Discharge Decreased caregiver support Pt lives alone with neighbor who can ckeck in intermittenly.    Equipment Recommendations  None recommended by PT    Recommendations for Other Services     Frequency Min 4X/week    Precautions / Restrictions Precautions Precautions: Fall Restrictions Weight Bearing Restrictions: No         Mobility  Bed Mobility Bed Mobility: Supine to Sit;Sitting - Scoot to Edge of Bed Supine to Sit: 6: Modified independent (Device/Increase time) Sitting - Scoot to Edge of Bed: 6: Modified independent (Device/Increase time) Details for Bed Mobility Assistance: Pt requiring increased time to perform bed mobility but requries no physical assistance. Transfers Transfers: Sit to Stand;Stand to Sit Sit to Stand: 4: Min guard Stand to Sit: 5: Supervision Details for Transfer Assistance: Pt with small LOB that pt self corrected initially upon standing. Ambulation/Gait Ambulation/Gait Assistance: 4: Min  guard;5: Supervision Ambulation Distance (Feet): 400 Feet Assistive device: None Ambulation/Gait Assistance Details: Pt supervision during ambulation, min guard with higher level balance testing (DGI) due to several small LOB during head turns, which pt reports is normal for her. Gait Pattern: Within Functional Limits Stairs: Yes Stairs Assistance: 4: Min guard;5: Supervision Stairs Assistance Details (indicate cue type and reason): Pt required min guard for safety initially, progressing to supervision when pt displayed no trouble navigating stairs with rail. Pt ascended/descended 4 stairs. Stair Management Technique: One rail Left Number of Stairs: 4  Wheelchair Mobility Wheelchair Mobility: No Modified Rankin (Stroke Patients Only) Pre-Morbid Rankin Score: No symptoms Modified Rankin: No significant disability    Exercises     PT Diagnosis: Difficulty walking;Abnormality of gait  PT Problem List: Decreased strength;Decreased activity tolerance;Decreased balance;Decreased mobility PT Treatment Interventions: DME instruction;Gait training;Stair training;Functional mobility training;Therapeutic activities;Therapeutic exercise;Balance training;Neuromuscular re-education;Patient/family education   PT Goals Acute Rehab PT Goals PT Goal Formulation: With patient Time For Goal Achievement: 12/03/11 Potential to Achieve Goals: Good Pt will go Sit to Stand: with modified independence PT Goal: Sit to Stand - Progress: Goal set today Pt will go Stand to Sit: with modified independence PT Goal: Stand to Sit - Progress: Goal set today Pt will Ambulate: >150 feet;with modified independence;with least restrictive assistive device PT Goal: Ambulate - Progress: Goal set today Pt will Go Up / Down Stairs: 3-5 stairs;with modified independence PT Goal: Up/Down Stairs - Progress: Goal set today Additional Goals Additional Goal #1: Pt will increase score on DGI to 22/24 to decrease fall risk. PT  Goal: Additional Goal #1 - Progress: Goal set today  Visit Information  Last  PT Received On: 11/19/11 Assistance Needed: +1    Subjective Data  Subjective: "I am feeling just fine."   Prior Functioning  Home Living Lives With: Alone Available Help at Discharge: Neighbor;Available PRN/intermittently Type of Home: House Home Access: Stairs to enter [employer] of Steps: 1 Entrance Stairs-Rails: None Home Layout: One level Bathroom Shower/Tub: Health visitor: Standard Home Adaptive Equipment: Built-in shower seat Prior Function Level of Independence: Independent Able to Take Stairs?: Yes Driving: Yes Vocation: Retired Musician: No difficulties Dominant Hand: Right    Cognition  Overall Cognitive Status: Appears within functional limits for tasks assessed/performed Arousal/Alertness: Awake/alert Orientation Level: Appears intact for tasks assessed Behavior During Session: [facility] for tasks performed    Extremity/Trunk Assessment Right Upper Extremity Assessment RUE ROM/Strength/Tone: Deficits RUE ROM/Strength/Tone Deficits: grossly 4/5 RUE Sensation: WFL - Light Touch Left Upper Extremity Assessment LUE ROM/Strength/Tone: Deficits LUE ROM/Strength/Tone Deficits: grossly 4/5 LUE Sensation: WFL - Light Touch Right Lower Extremity Assessment RLE ROM/Strength/Tone: Within functional levels RLE Sensation: WFL - Light Touch Left Lower Extremity Assessment LLE ROM/Strength/Tone: Within functional levels LLE Sensation: WFL - Light Touch Trunk Assessment Trunk Assessment: Normal   Balance Balance Balance Assessed: Yes Standardized Balance Assessment Standardized Balance Assessment: Dynamic Gait Index Dynamic Gait Index Level Surface: Normal Change in Gait Speed: Mild Impairment Gait with Horizontal Head Turns: Mild Impairment Gait with Vertical Head Turns: Mild Impairment Gait and Pivot Turn: Normal Step Over Obstacle:  Normal Step Around Obstacles: Mild Impairment Steps: Mild Impairment Total Score: 19   End of Session PT - End of Session Equipment Utilized During Treatment: Gait belt Activity Tolerance: Patient tolerated treatment well Patient left: in bed;with call bell/phone within reach (with cardiac for doppler) Nurse Communication: Mobility status  GP     Mort Smelser 11/19/2011, 10:52 AM

## 2011-11-19 NOTE — Progress Notes (Signed)
VASCULAR LAB PRELIMINARY  PRELIMINARY  PRELIMINARY  PRELIMINARY  Carotid duplex completed.    Preliminary report:  Bilateral:  No evidence of hemodynamically significant internal carotid artery stenosis.   Vertebral artery flow is antegrade.     Adonai Selsor, RVS 11/19/2011, 10:33 AM

## 2011-11-19 NOTE — Progress Notes (Signed)
Stroke Team Progress Note  HISTORY [name] is an 78 y.o. female history of stroke in 2007 affecting left upper extremity, hypertension and peptic ulcer disease, presenting with acute weakness involving left hand. Patient was last known normal at bedtime 11/17/2011 at 10:30 PM. She woke up with weakness and inability to use her left hand on 11/18/2011. She had no changes in speech and no facial weakness. She also had no left lower extremity symptoms. Strength has improved since admission to the hospital. NIH stroke score at this point is 0. MRI of her brain showed a small right posterior frontal cortical stroke. Patient has been on aspirin and Plavix daily. Patient was not a TPA candidate secondary to unknown time of onset. She was admitted  for further evaluation and treatment.  SUBJECTIVE No one is at the bedside.  Overall she feels her condition is rapidly improving. She almost feels back to normal.   OBJECTIVE Most recent Vital Signs: Filed Vitals:   11/19/11 0400 11/19/11 0600 11/19/11 0833 11/19/11 0934  BP: 154/54 140/69 166/81 141/53  Pulse: 65 66 68 65  Temp: 98.8 F (37.1 C) 98.2 F (36.8 C) 98.7 F (37.1 C) 98.3 F (36.8 C)  TempSrc: Oral Oral Oral Oral  Resp: 18 18 20 20  Height:      Weight:      SpO2: 97% 98% 94% 97%   Intake/Output from previous day: 09/09 0701 - 09/10 0700 In: 120 [P.O.:120] Out: -   IV Fluid Intake:     . sodium chloride 75 mL/hr at 11/18/11 2307   MEDICATIONS    . aspirin  81 mg Oral Daily  . clopidogrel  75 mg Oral Q breakfast  . diltiazem  240 mg Oral Daily  . docusate sodium  100 mg Oral BID  . multivitamin with minerals  1 tablet Oral Daily  . DISCONTD: CENTRUM SILVER  1 tablet Oral Daily   PRN:  hydrALAZINE, ondansetron (ZOFRAN) IV  Diet:  General thin liquids Activity:   DVT Prophylaxis:  SCDs   CLINICALLY SIGNIFICANT STUDIES Basic Metabolic Panel:  Lab 11/18/11 1142  NA 138  K 4.1  CL 101  CO2 26  GLUCOSE 127*  BUN 17    CREATININE 0.86  CALCIUM 9.5  MG --  PHOS --   Liver Function Tests:  Lab 11/18/11 1142  AST 17  ALT 17  ALKPHOS 144*  BILITOT 0.2*  PROT 7.4  ALBUMIN 3.8   CBC:  Lab 11/18/11 1142  WBC 7.2  NEUTROABS 5.0  HGB 15.2*  HCT 43.9  MCV 88.2  PLT 338   Coagulation:  Lab 11/18/11 1142  LABPROT 13.9  INR 1.05   Cardiac Enzymes:  Lab 11/19/11 0300  CKTOTAL --  CKMB --  CKMBINDEX --  TROPONINI <0.30   Urinalysis: No results found for this basename: COLORURINE:2,APPERANCEUR:2,LABSPEC:2,PHURINE:2,GLUCOSEU:2,HGBUR:2,BILIRUBINUR:2,KETONESUR:2,PROTEINUR:2,UROBILINOGEN:2,NITRITE:2,LEUKOCYTESUR:2 in the last 168 hours Lipid Panel    Component Value Date/Time   CHOL 188 11/19/2011 0300   TRIG 203* 11/19/2011 0300   HDL 66 11/19/2011 0300   CHOLHDL 2.8 11/19/2011 0300   VLDL 41* 11/19/2011 0300   LDLCALC 81 11/19/2011 0300   HgbA1C  Lab Results  Component Value Date   HGBA1C 6.2* 11/19/2011    Urine Drug Screen:   No results found for this basename: labopia, cocainscrnur, labbenz, amphetmu, thcu, labbarb    Alcohol Level: No results found for this basename: ETH:2 in the last 168 hours  Ct Cervical Spine Wo Contrast  11/18/2011  No   acute or traumatic finding.  Advanced degenerative facet arthropathy and spondylosis.  Multiple foraminal stenoses could effect multiple cervical nerve roots.  Canal narrowing on the left at C4-5 and centrally at C6-7 could possibly affect the cord.      CT of the brain  11/18/2011 Atrophy.  Chronic small vessel change.  No acute or reversible findings by CT.    MRI of the brain  11/18/2011   Acute small non hemorrhagic infarct posterior right frontal lobe (motor strip).  Moderate small vessel disease type changes.  Global atrophy without hydrocephalus.    MRA of the brain  11/19/2011  Mild irregularity of the distal MCA branches suggests intracranial atherosclerotic disease.    2D Echocardiogram    Carotid Doppler  Bilateral: No evidence of  hemodynamically significant internal carotid artery stenosis. Vertebral artery flow is antegrade.    CXR  11/18/2011   1.  Stable mild chronic bronchitic changes. 2.  Interval minimal linear atelectasis or scarring at the left lung base.    EKG  normal EKG, normal sinus rhythm, unchanged from previous tracings.   Therapy Recommendations PT - outpatient; OT -   Physical Exam   Pleasant middle aged lady not in distress.Awake alert. Afebrile. Head is nontraumatic. Neck is supple without bruit. Hearing is normal. Cardiac exam no murmur or gallop. Lungs are clear to auscultation. Distal pulses are well felt.  Neurological exam ; Awake alert oriented x 3 normal speech and language. Fundi not visualized. Vision acuity and fields are normal. Mild left lower face asymmetry. Tongue midline. No drift. Mild diminished fine finger movements on left. Orbits right over left upper extremity. Mild left grip weak.. Normal sensation . Normal coordination.  ASSESSMENT [name] is a 78 y.o. female presenting with left hand weakness. Imaging confirms a small right frontal lobe infarct. Infarct felt to be embolic, likely cardioembolic. No history of afib or irregular heart beat seen on Tele. TEE and OP tele monitoring needed to complete embolic assessment. Work up underway. On aspirin 81 mg orally every day and clopidogrel 75 mg orally every day prior to admission. Now on aspirin 81 mg orally every day and clopidogrel 75 mg orally every day for secondary stroke prevention. Patient with resultant left hand hemiparesis that is improving.   CVA hx  Hypertension  Hospital day # 1  TREATMENT/PLAN  Continue clopidogrel 75 mg orally every day for secondary stroke prevention.  Discontinue aspirin. Per the [place] study, long-term dual antipaltelets put patient at risk for hemorrhage. No indication for dual antiplatelets in stroke unless patient has recent stent, afib and not a coumadin candidate, or short-term in  setting of severe intracranial atherosclerosis. TEE to look for embolic source. Arrange with [facility] for Wed, 11/20/2011. Will need to be NPO after midnight. If positive for PFO (patent foramen ovale), check bilateral lower extremity venous dopplers to rule out DVT as possible source of stroke.  If TEE negative, please schedule outpatient telemetry monitoring to assess patient for atrial fibrillation as source of stroke. May be arranged with patient's cardiologist, or cardiologist of choice.   SHARON BIBY, MSN, RN, ANVP-BC, ANP-BC, GNP-BC [facility] Pager: [phone] 11/19/2011 10:38 AM  Scribe for Dr. Jaramiah Bossard, Stroke Center Medical Director, who has personally reviewed chart, pertinent data, examined the patient and developed the plan of care. Pager:  [phone]   

## 2011-11-19 NOTE — Progress Notes (Signed)
Subjective: [name] awoke yesterday with inability to move her left arm. She came to [facility] where her symptoms were improved. CT brain - negative, MRI with posterior right motor strip infarct - small. Neuro exam by IM and Neuro was without significant deficits. She was transferred to [facility] for further evaluation and treatment. She was on aspirin and Plavix which has been continued. She was not a candidate for TPA due to time interval from on-set and minor findings.  Objective: Lab: Lab Results  Component Value Date   WBC 7.2 11/18/2011   HGB 15.2* 11/18/2011   HCT 43.9 11/18/2011   MCV 88.2 11/18/2011   PLT 338 11/18/2011   BMET    Component Value Date/Time   NA 138 11/18/2011 1142   K 4.1 11/18/2011 1142   CL 101 11/18/2011 1142   CO2 26 11/18/2011 1142   GLUCOSE 127* 11/18/2011 1142   BUN 17 11/18/2011 1142   CREATININE 0.86 11/18/2011 1142   CALCIUM 9.5 11/18/2011 1142   GFRNONAA 59* 11/18/2011 1142   GFRAA 69* 11/18/2011 1142     Imaging: CT Brain - negative CT cervical with mild-moderate DDD MRI brain: IMPRESSION:  Acute small non hemorrhagic infarct posterior right frontal lobe  (motor strip).  Moderate small vessel disease type changes.  Global atrophy without hydrocephalus.  Critical Value/emergent results were called by telephone at the  time of interpretation on 11/18/2011 at 2:23 p.m. to Dr. Lynelle Doctor. ,  who verbally acknowledged these results.  Original Report Authenticated By: Fuller Canada, M.D.  Scheduled Meds:   . aspirin  81 mg Oral Daily  . clopidogrel  75 mg Oral Q breakfast  . diltiazem  240 mg Oral Daily  . docusate sodium  100 mg Oral BID  . multivitamin with minerals  1 tablet Oral Daily  . DISCONTD: CENTRUM SILVER  1 tablet Oral Daily   Continuous Infusions:   . sodium chloride 75 mL/hr at 11/18/11 2307   PRN Meds:.hydrALAZINE, ondansetron (ZOFRAN) IV   Physical Exam: Filed Vitals:   11/19/11 0600  BP: 140/69  Pulse: 66  Temp: 98.2 F (36.8 C)    Resp: 18   Gen'l - WNWD white woman in no distress HEENT- C&S clear Neck- full ROM active Cor- 2+ radial pulse, no carotid bruits, RRR Pulm - normal respirations Abd- soft, BS + Neuro - A&O x 3, Speech clear, cognition at her normal baseline. CN- normal facial symmetry, PERRLA EOMI, normal shoulder shrug; MS - normal grip strength and UE strength bilaterally, normal finger movement bilaterally; DTRs 2+ at biceps and radial tendons; cerebellar no tremor, no cog-wheeling MSK- nl ROM wrist, elbow, shoulder on left. No crepitus at shoulder and no pain with movement.    Assessment/Plan: 1. Neuro - patient with minor CVA right posterior frontal lobe with transient weakness in her fingers now normal. Plan Complete stroke w/u: 2 D echo, carotids, PT/OT eval  Continue ASA/Plavix  2. HTN - ok control. No change in medication  3. GERD - stable  4. Cardiac - trop I normal. Telemetry - normal. Doubt any cardiac event. No further eval unless symptomatic  Dispo- home soon     Doroteo Glassman IM (o) [phone]; (c) [phone] Call-grp - Patsi Sears IM Tele: [phone]  11/19/2011, 6:45 AM

## 2011-11-19 NOTE — Evaluation (Signed)
Occupational Therapy Evaluation Patient Details Name: [name] MRN: [id number] DOB: 13-Jun-1922 Today's Date: 11/19/2011 Time: 4098-1191 OT Time Calculation (min): 18 min  OT Assessment / Plan / Recommendation Clinical Impression  Pt is a 78 yo female admitted with left hand weakness and per pt's chart, acute small non hemorrhagic infarct posterior right frontal lobe. Pt presents at baseline during OT eval. Difference in pt's strength in right and left hand consistent for right hand dominant individual. Pt states she feels at baseline. Provided pt with bil UE exercises to improve strength, as pt was interested in general strengthening. No UE coordination or sensation deficits noted during testing or functional activities. Pt ambulated around room independently and simulated showering with no LOB. No further acute OT needs at this time. Encouarged pt to initially seek/allow help from friends/neighbors     OT Assessment  Patient does not need any further OT services    Follow Up Recommendations  No OT follow up    Barriers to Discharge      Equipment Recommendations  None recommended by PT;None recommended by OT    Recommendations for Other Services    Frequency       Precautions / Restrictions Precautions Precautions: Fall Restrictions Weight Bearing Restrictions: No   Pertinent Vitals/Pain Pt denies any pain     ADL  Eating/Feeding: Performed;Independent Where Assessed - Eating/Feeding: Edge of bed Grooming: Performed;Wash/dry hands;Independent Where Assessed - Grooming: Unsupported standing Upper Body Bathing: Simulated;Modified independent (incr time) Where Assessed - Upper Body Bathing: Unsupported sit to stand Lower Body Bathing: Simulated;Supervision/safety Where Assessed - Lower Body Bathing: Unsupported standing Lower Body Dressing: Performed;Independent Where Assessed - Lower Body Dressing: Unsupported sit to stand Toilet Transfer: Performed;Independent Toilet  Transfer Method: Sit to Barista: Regular height toilet Toileting - Clothing Manipulation and Hygiene: Simulated;Independent Where Assessed - Toileting Clothing Manipulation and Hygiene: Sit to stand from 3-in-1 or toilet Tub/Shower Transfer: Performed;Modified independent Tub/Shower Transfer Method: Ambulating (hold to shower frame to maintain balance) Tub/Shower Transfer Equipment: Walk in shower Equipment Used: Gait belt Transfers/Ambulation Related to ADLs: pt ambulated I'ly throughout room; no LOB or unsteadiness noted    OT Diagnosis:    OT Problem List:   OT Treatment Interventions:     OT Goals    Visit Information  Last OT Received On: 11/19/11 Assistance Needed: +1    Subjective Data  Subjective: If I get up, this bed will sing. Patient Stated Goal: Return home   Prior Functioning  Vision/Perception  Home Living Lives With: Alone Available Help at Discharge: Neighbor;Available PRN/intermittently Type of Home: House Home Access: Stairs to enter [employer] of Steps: 1 Entrance Stairs-Rails: None Home Layout: One level Bathroom Shower/Tub: Health visitor: Standard Home Adaptive Equipment: Built-in shower seat Prior Function Level of Independence: Independent Able to Take Stairs?: Yes Driving: Yes Vocation: Retired Musician: No difficulties Dominant Hand: Right      Cognition  Overall Cognitive Status: Appears within functional limits for tasks assessed/performed Arousal/Alertness: Awake/alert Orientation Level: Appears intact for tasks assessed Behavior During Session: [facility] for tasks performed    Extremity/Trunk Assessment Right Upper Extremity Assessment RUE ROM/Strength/Tone: Within functional levels RUE Sensation: WFL - Light Touch RUE Coordination: WFL - gross/fine motor Left Upper Extremity Assessment LUE ROM/Strength/Tone: Within functional levels LUE Sensation: WFL - Light  Touch LUE Coordination: WFL - gross/fine motor   Mobility  Shoulder Instructions  Bed Mobility Bed Mobility: Sit to Supine Supine to Sit: 7: Independent;HOB flat Sitting -  Scoot to Edge of Bed: 7: Independent Sit to Supine: 7: Independent;HOB flat Transfers Sit to Stand: From bed;Without upper extremity assist;From toilet;7: Independent Stand to Sit: To bed;To toilet;Without upper extremity assist;7: Independent       Exercise     Balance     End of Session OT - End of Session Equipment Utilized During Treatment: Gait belt Activity Tolerance: Patient tolerated treatment well Patient left: in bed;with call bell/phone within reach;with bed alarm set Nurse Communication: Mobility status  GO     Tiffnay Bossi 11/19/2011, 4:30 PM

## 2011-11-19 NOTE — Evaluation (Signed)
I have read and agree with the below assessment and plan.   Sejla Marzano Helen Whitlow PT, DPT Pager: [phone] 

## 2011-11-19 NOTE — Progress Notes (Signed)
  Echocardiogram 2D Echocardiogram has been performed.  Jorje Guild 11/19/2011, 4:14 PM

## 2011-11-19 NOTE — Care Management Note (Signed)
    Page 1 of 1   11/21/2011     9:42:11 AM   CARE MANAGEMENT NOTE 11/21/2011  Patient:  [name], [name]   Account Number:  [id number]  Date Initiated:  11/19/2011  Documentation initiated by:  Onnie Boer  Subjective/Objective Assessment:   PT WAS ADMITTED WITH WEAKNESS     Action/Plan:   PROGRESSION OF CARE AND DISCHARGE PLANNING   Anticipated DC Date:  11/21/2011   Anticipated DC Plan:  HOME/SELF CARE      DC Planning Services  CM consult      Choice offered to / List presented to:             Status of service:  Completed, signed off Medicare Important Message given?   (If response is "NO", the following Medicare IM given date fields will be blank) Date Medicare IM given:   Date Additional Medicare IM given:    Discharge Disposition:  HOME/SELF CARE  Per UR Regulation:  Reviewed for med. necessity/level of care/duration of stay  If discussed at Long Length of Stay Meetings, dates discussed:    Comments:  11/19/11 Onnie Boer, RN,BSN 1419 PT WAS ADMITTED FOR WEAKNESS, PTA PT WAS AT HOME WITH SELF CARE.  WILL F/U ON DC NEEDS AND RECOMMENDATIONS.

## 2011-11-20 ENCOUNTER — Encounter (HOSPITAL_COMMUNITY)
Admission: EM | Disposition: A | Discharge status: Home / Self Care | Payer: Self-pay | Source: Home / Self Care | Attending: Internal Medicine

## 2011-11-20 DIAGNOSIS — I200 Unstable angina: Secondary | ICD-10-CM

## 2011-11-20 DIAGNOSIS — K219 Gastro-esophageal reflux disease without esophagitis: Secondary | ICD-10-CM

## 2011-11-20 HISTORY — PX: TEE WITHOUT CARDIOVERSION: SHX5443

## 2011-11-20 SURGERY — ECHOCARDIOGRAM, TRANSESOPHAGEAL
Anesthesia: Moderate Sedation

## 2011-11-20 MED ORDER — PANTOPRAZOLE SODIUM 40 MG PO TBEC: 40.0000 mg | DELAYED_RELEASE_TABLET | Freq: Every day | ORAL | Status: DC

## 2011-11-20 MED ORDER — MIDAZOLAM HCL 5 MG/ML IJ SOLN
INTRAMUSCULAR | Status: AC
  Filled 2011-11-20: qty 2

## 2011-11-20 MED ORDER — SODIUM CHLORIDE 0.9 % IV SOLN
INTRAVENOUS | Status: DC
  Administered 2011-11-20: 500 mL via INTRAVENOUS

## 2011-11-20 MED ORDER — MIDAZOLAM HCL 10 MG/2ML IJ SOLN
INTRAMUSCULAR | Status: DC | PRN
  Administered 2011-11-20 (×3): 1 mg via INTRAVENOUS

## 2011-11-20 MED ORDER — LIDOCAINE VISCOUS 2 % MT SOLN
OROMUCOSAL | Status: DC | PRN
  Administered 2011-11-20: 10 mL via OROMUCOSAL

## 2011-11-20 MED ORDER — FENTANYL CITRATE 0.05 MG/ML IJ SOLN
INTRAMUSCULAR | Status: AC
  Filled 2011-11-20: qty 2

## 2011-11-20 MED ORDER — LIDOCAINE VISCOUS 2 % MT SOLN
OROMUCOSAL | Status: AC
  Filled 2011-11-20: qty 15

## 2011-11-20 MED ORDER — FENTANYL CITRATE 0.05 MG/ML IJ SOLN
INTRAMUSCULAR | Status: DC | PRN
  Administered 2011-11-20: 12.5 ug via INTRAVENOUS
  Administered 2011-11-20: 25 ug via INTRAVENOUS

## 2011-11-20 NOTE — Interval H&P Note (Signed)
History and Physical Interval Note:  11/20/2011 4:15 PM  [name]  has presented today for surgery, with the diagnosis of cva  The various methods of treatment have been discussed with the patient and family. After consideration of risks, benefits and other options for treatment, the patient has consented to  Procedure(s) (LRB) with comments: TRANSESOPHAGEAL ECHOCARDIOGRAM (TEE) (N/A) as a surgical intervention .  The patient's history has been reviewed, patient examined, no change in status, stable for surgery.  I have reviewed the patient's chart and labs.  Questions were answered to the patient's satisfaction.     Celise Bazar

## 2011-11-20 NOTE — Progress Notes (Signed)
Physical Therapy Treatment Patient Details Name: [name] MRN: [id number] DOB: [birth year] Today's Date: 11/20/2011 Time: 4098-1191 PT Time Calculation (min): 13 min  PT Assessment / Plan / Recommendation Comments on Treatment Session  Patient able to meet acute PT goals. Continue to recommend OOPT though patient wanting to refuse at this time.     Follow Up Recommendations  Outpatient PT    Barriers to Discharge        Equipment Recommendations  None recommended by PT;None recommended by OT    Recommendations for Other Services    Frequency     Plan All goals met and education completed, patient dischaged from PT services;Discharge plan remains appropriate    Precautions / Restrictions Precautions Precautions: None   Pertinent Vitals/Pain     Mobility  Bed Mobility Supine to Sit: 7: Independent Sit to Supine: 7: Independent Transfers Sit to Stand: 7: Independent Stand to Sit: 7: Independent Ambulation/Gait Ambulation/Gait Assistance: 6: Modified independent (Device/Increase time) Ambulation Distance (Feet): 1300 Feet Assistive device: None Ambulation/Gait Assistance Details: Patient tested with dynamic gait activity Gait Pattern: Within Functional Limits Stairs: Yes Stairs Assistance: 5: Supervision Stair Management Technique: No rails;Alternating pattern Number of Stairs: 10  Modified Rankin (Stroke Patients Only) Pre-Morbid Rankin Score: No symptoms Modified Rankin: No significant disability    Exercises     PT Diagnosis:    PT Problem List:   PT Treatment Interventions:     PT Goals Acute Rehab PT Goals PT Goal: Sit to Stand - Progress: Met PT Goal: Stand to Sit - Progress: Met PT Goal: Ambulate - Progress: Met PT Goal: Up/Down Stairs - Progress: Partly met (patient at supervision level with no rails) Additional Goals PT Goal: Additional Goal #1 - Progress: Met  Visit Information  Last PT Received On: 11/20/11 Assistance Needed: +1      Subjective Data  Subjective: I am ready to go home   Cognition  Overall Cognitive Status: Appears within functional limits for tasks assessed/performed Arousal/Alertness: Awake/alert Orientation Level: Appears intact for tasks assessed Behavior During Session: [facility] for tasks performed    Balance  Dynamic Gait Index Level Surface: Normal Change in Gait Speed: Normal Gait with Horizontal Head Turns: Normal Gait with Vertical Head Turns: Mild Impairment Gait and Pivot Turn: Normal Step Over Obstacle: Normal Step Around Obstacles: Normal Steps: Normal Total Score: 23   End of Session PT - End of Session Equipment Utilized During Treatment: Gait belt Activity Tolerance: Patient tolerated treatment well Patient left: in bed;with call bell/phone within reach Nurse Communication: Mobility status   GP     Fredrich Birks 11/20/2011, 11:02 AM

## 2011-11-20 NOTE — Discharge Summary (Signed)
[name], [name]                   ACCOUNT NO.:  [id number]  MEDICAL RECORD NO.:  [id number]  LOCATION:  [id number]                        FACILITY:  [facility]  PHYSICIAN:  Rosalyn Gess. Norins, MD  DATE OF BIRTH:  1922-08-26  DATE OF ADMISSION:  11/18/2011 DATE OF DISCHARGE:                              DISCHARGE SUMMARY   The patient is admitted the hospital, November 18, 2011.  Discharged to home on November 20, 2011.  ADMITTING DIAGNOSES: 1. Cerebrovascular accident, posterior aspect of right frontal lobe     with hand weakness that was transient. 2. Elevated troponins. 3. Hypertension. 4. Gastroesophageal reflux disease.  DISCHARGE DIAGNOSES: 1. Cerebrovascular accident, posterior aspect of right frontal lobe     with hand weakness that was transient. 2. Elevated troponins. 3. Hypertension. 4. Gastroesophageal reflux disease.  CONSULTANTS:  [facility], [facility].  PROCEDURES: 1. CT of the cervical spine November 18, 2011, which showed no acute     traumatic findings.  Advanced degenerative facet arthropathy and     spondylosis noted.  Mild foraminal stenosis could affect multiple     cervical nerve roots.  Canal narrowing at left C4-5 and centrally     at C6-7. 2. CT of the brain which showed atrophy, chronic small vessel change.     No acute or reversible findings by CT scan. 3. MR of the brain without contrast November 18, 2011, which showed     acute small nonhemorrhagic infarct posterior right frontal lobe     (motor strip).  Moderate small vessel disease-type changes.  Global     atrophy without hydrocephalus. 4. Chest x-ray 2 views, November 18, 2011, which showed stable mild     chronic bronchitic changes.  Interval minimal linear atelectasis or     scarring at the left lung base. 5. MRA of the head without contrast performed November 19, 2011,     which showed mild irregularity of the distal MCA branches.  Suggest   intracranial atherosclerotic disease. 6. Carotid Doppler performed September 10, which showed no significant     extracranial carotid artery stenosis.  Vertebrals are patent with     antegrade flow. 7. A 2D echocardiogram November 19, 2011, which showed an ejection     fraction of 65-70%.  Normal wall motion.  Grade 1 diastolic     dysfunction is noted.  Aortic valve with trivial regurgitation.     Mildly dilated left atrium.  Tricuspid valve with mild     regurgitation. 8. Transesophageal echocardiogram by Dr. Gala Romney which revealed the     patient to have a small PFO with a markedly positive bubble study.     Normal LV and RV function.  Normal valves.  HISTORY OF PRESENT ILLNESS:  [name] is an 78 year old woman who awoke on the morning of admission with the inability to move her left hand and fingers.  She had gone to bed in normal condition.  Because of her symptoms, she called EMS to transport her to [facility].  By the time of arrival, her symptoms have resolved.  She was considered a code stroke.  But because  of the uncertain time of onset of her symptoms, she was not considered a candidate for tPA.  In addition, her symptoms had totally resolved.  Evaluation did reveal that the patient have had a small nonhemorrhagic CVA in the posterior right frontal lobe in the motor strip.  HOSPITAL COURSE:  The patient was admitted at the [facility], transferred from [facility] to the neurology floor.  She had a full evaluation with studies as outlined above.  The patient was continued on aspirin and Plavix with no proposed change in therapy.  The patient was seen by PT and OT, was felt to have no ongoing physical therapy needs.  She was seen by speech pathology with no ongoing suggestions for any additional treatment.  She had a good swallow.  She had good cognitive function. Good speech.  She was seen by Occupational Therapy who felt the patient was highly functional and did  not have any occupational therapy needs. During the course of her hospital stay and evaluation, the patient remained stable.  She had no recurrent neurologic symptoms.  She had no pain or discomfort.  She remained hemodynamically stable.  With the patient having had a full evaluation with no additional testing being required at this time as an inpatient, she is stable to be discharged to home.  With the positive study for small PFO per Neurology recommendation, she will be scheduled for lower extremity venous Dopplers as an outpatient to rule out any potential source of emboli. The patient will be switched to pantoprazole for a GERD treatment because of less interaction with Plavix.  The patient will be seen by Dr. Pearlean Brownie in 2 months.  She will be seen by internal medicine for followup in 5-7 days.  DISCHARGE EXAMINATION:  VITAL SIGNS:  Temperature was 98, blood pressure 151/60, pulse 64, respirations 18, oxygen saturation 97% on room air.  GENERAL APPEARANCE:  A well-nourished woman looking considerably younger than her 88 years in no acute distress.  HEENT:  Normocephalic, atraumatic.  Conjunctivae and sclerae were clear. Oropharynx without lesions.  NECK:  Supple without thyromegaly.  Nodes, no adenopathy was noted in the cervical or supraclavicular regions.  CARDIOVASCULAR:  2+ peripheral pulses.  She had a quiet precordium.  She had a regular rate and rhythm.  She had a soft 2/6 systolic murmur heard best at the left sternal border.  BREASTS:  Deferred.  ABDOMEN:  Soft.  No guarding or rebound was noted.  PELVIC AND RECTAL:  Deferred.  EXTREMITIES:  Without clubbing, cyanosis, edema.  Calves are not tender, not swollen.  NEUROLOGIC:  The patient is awake, alert.  She is oriented to person, place, time and context.  Her speech is clear.  Cognition is normal. Cranial nerves II through XII revealed normal facial symmetry and movement.  Extraocular muscles were intact.  The  pupils were equal and round.  The patient had normal shoulder shrug.  Motor strength, the patient has excellent grips both hands.  She is able to move both legs against gravity.  She was not otherwise tested.  Cerebellar function, the patient has no tremor.  No cogwheeling.  She has normal rapid alternating finger movements.  DERM:  The patient has no skin lesions.  Of note, no signs of skin breakdown.  FINAL LABORATORY:  Final chemistry panel from September 9 on the day of admission with a sodium of 138, potassium 4.1, chloride 101, CO2 of 26, BUN of 17, creatinine 0.86, glucose was 127.  Liver functions were  normal except for an elevated alkaline phosphatase of 144.  Troponin I was 0.17 followed by 0.13 and then less than 0.30 from serum draw as opposed to a point of care.  Cholesterol was 188, triglycerides were 203, HDL was 66, LDL was 81.  CBC from day of admission with a white count of 7200, hemoglobin 15.2 g, platelet count 338,000.  Hemoglobin A1c was 6.2%.  TSH was normal at 0.63 in June and was not repeated this admission.  DISPOSITION:  The patient will be discharged to home.  She will be contacted for office followup.  She will be contacted for followup lower extremity venous Dopplers.  The patient's condition at the time of discharge dictation is medically stable.     Rosalyn Gess Norins, MD     MEN/MEDQ  D:  11/20/2011  T:  11/20/2011  Job:  [id number]  cc:   Pramod P. Pearlean Brownie, MD

## 2011-11-20 NOTE — H&P (View-Only) (Signed)
Stroke Team Progress Note  HISTORY [name] is an 78 y.o. female history of stroke in 2007 affecting left upper extremity, hypertension and peptic ulcer disease, presenting with acute weakness involving left hand. Patient was last known normal at bedtime 11/17/2011 at 10:30 PM. She woke up with weakness and inability to use her left hand on 11/18/2011. She had no changes in speech and no facial weakness. She also had no left lower extremity symptoms. Strength has improved since admission to the hospital. NIH stroke score at this point is 0. MRI of her brain showed a small right posterior frontal cortical stroke. Patient has been on aspirin and Plavix daily. Patient was not a TPA candidate secondary to unknown time of onset. She was admitted  for further evaluation and treatment.  SUBJECTIVE No one is at the bedside.  Overall she feels her condition is rapidly improving. She almost feels back to normal.   OBJECTIVE Most recent Vital Signs: Filed Vitals:   11/19/11 0400 11/19/11 0600 11/19/11 0833 11/19/11 0934  BP: 154/54 140/69 166/81 141/53  Pulse: 65 66 68 65  Temp: 98.8 F (37.1 C) 98.2 F (36.8 C) 98.7 F (37.1 C) 98.3 F (36.8 C)  TempSrc: Oral Oral Oral Oral  Resp: 18 18 20 20   Height:      Weight:      SpO2: 97% 98% 94% 97%   Intake/Output from previous day: 09/09 0701 - 09/10 0700 In: 120 [P.O.:120] Out: -   IV Fluid Intake:     . sodium chloride 75 mL/hr at 11/18/11 2307   MEDICATIONS    . aspirin  81 mg Oral Daily  . clopidogrel  75 mg Oral Q breakfast  . diltiazem  240 mg Oral Daily  . docusate sodium  100 mg Oral BID  . multivitamin with minerals  1 tablet Oral Daily  . DISCONTD: CENTRUM SILVER  1 tablet Oral Daily   PRN:  hydrALAZINE, ondansetron (ZOFRAN) IV  Diet:  General thin liquids Activity:   DVT Prophylaxis:  SCDs   CLINICALLY SIGNIFICANT STUDIES Basic Metabolic Panel:  Lab 11/18/11 8413  NA 138  K 4.1  CL 101  CO2 26  GLUCOSE 127*  BUN 17    CREATININE 0.86  CALCIUM 9.5  MG --  PHOS --   Liver Function Tests:  Lab 11/18/11 1142  AST 17  ALT 17  ALKPHOS 144*  BILITOT 0.2*  PROT 7.4  ALBUMIN 3.8   CBC:  Lab 11/18/11 1142  WBC 7.2  NEUTROABS 5.0  HGB 15.2*  HCT 43.9  MCV 88.2  PLT 338   Coagulation:  Lab 11/18/11 1142  LABPROT 13.9  INR 1.05   Cardiac Enzymes:  Lab 11/19/11 0300  CKTOTAL --  CKMB --  CKMBINDEX --  TROPONINI <0.30   Urinalysis: No results found for this basename: COLORURINE:2,APPERANCEUR:2,LABSPEC:2,PHURINE:2,GLUCOSEU:2,HGBUR:2,BILIRUBINUR:2,KETONESUR:2,PROTEINUR:2,UROBILINOGEN:2,NITRITE:2,LEUKOCYTESUR:2 in the last 168 hours Lipid Panel    Component Value Date/Time   CHOL 188 11/19/2011 0300   TRIG 203* 11/19/2011 0300   HDL 66 11/19/2011 0300   CHOLHDL 2.8 11/19/2011 0300   VLDL 41* 11/19/2011 0300   LDLCALC 81 11/19/2011 0300   HgbA1C  Lab Results  Component Value Date   HGBA1C 6.2* 11/19/2011    Urine Drug Screen:   No results found for this basename: labopia, cocainscrnur, labbenz, amphetmu, thcu, labbarb    Alcohol Level: No results found for this basename: ETH:2 in the last 168 hours  Ct Cervical Spine Wo Contrast  11/18/2011  No  acute or traumatic finding.  Advanced degenerative facet arthropathy and spondylosis.  Multiple foraminal stenoses could effect multiple cervical nerve roots.  Canal narrowing on the left at C4-5 and centrally at C6-7 could possibly affect the cord.      CT of the brain  11/18/2011 Atrophy.  Chronic small vessel change.  No acute or reversible findings by CT.    MRI of the brain  11/18/2011   Acute small non hemorrhagic infarct posterior right frontal lobe (motor strip).  Moderate small vessel disease type changes.  Global atrophy without hydrocephalus.    MRA of the brain  11/19/2011  Mild irregularity of the distal MCA branches suggests intracranial atherosclerotic disease.    2D Echocardiogram    Carotid Doppler  Bilateral: No evidence of  hemodynamically significant internal carotid artery stenosis. Vertebral artery flow is antegrade.    CXR  11/18/2011   1.  Stable mild chronic bronchitic changes. 2.  Interval minimal linear atelectasis or scarring at the left lung base.    EKG  normal EKG, normal sinus rhythm, unchanged from previous tracings.   Therapy Recommendations PT - outpatient; OT -   Physical Exam   Pleasant middle aged lady not in distress.Awake alert. Afebrile. Head is nontraumatic. Neck is supple without bruit. Hearing is normal. Cardiac exam no murmur or gallop. Lungs are clear to auscultation. Distal pulses are well felt.  Neurological exam ; Awake alert oriented x 3 normal speech and language. Fundi not visualized. Vision acuity and fields are normal. Mild left lower face asymmetry. Tongue midline. No drift. Mild diminished fine finger movements on left. Orbits right over left upper extremity. Mild left grip weak.. Normal sensation . Normal coordination.  ASSESSMENT [name] is a 78 y.o. female presenting with left hand weakness. Imaging confirms a small right frontal lobe infarct. Infarct felt to be embolic, likely cardioembolic. No history of afib or irregular heart beat seen on Tele. TEE and OP tele monitoring needed to complete embolic assessment. Work up underway. On aspirin 81 mg orally every day and clopidogrel 75 mg orally every day prior to admission. Now on aspirin 81 mg orally every day and clopidogrel 75 mg orally every day for secondary stroke prevention. Patient with resultant left hand hemiparesis that is improving.   CVA hx  Hypertension  Hospital day # 1  TREATMENT/PLAN  Continue clopidogrel 75 mg orally every day for secondary stroke prevention.  Discontinue aspirin. Per the [place]ce[place] study, long-term dual antipaltelets put patient at risk for hemorrhage. No indication for dual antiplatelets in stroke unless patient has recent stent, afib and not a coumadin candidate, or short-term in  setting of severe intracranial atherosclerosis. TEE to look for embolic source. Arrange with [facility] for Wed, 11/20/2011. Will need to be NPO after midnight. If positive for PFO (patent foramen ovale), check bilateral lower extremity venous dopplers to rule out DVT as possible source of stroke.  If TEE negative, please schedule outpatient telemetry monitoring to assess patient for atrial fibrillation as source of stroke. May be arranged with patient's cardiologist, or cardiologist of choice.   Annie Main, MSN, RN, ANVP-BC, ANP-BC, [facility] Pager: [phone] 11/19/2011 10:38 AM  Scribe for Dr. Delia Heady, Stroke Center Medical Director, who has personally reviewed chart, pertinent data, examined the patient and developed the plan of care. Pager:  [phone]

## 2011-11-20 NOTE — Progress Notes (Signed)
  Echocardiogram Echocardiogram Transesophageal has been performed.  [name] 11/20/2011, 4:49 PM

## 2011-11-20 NOTE — Progress Notes (Signed)
Reviewed all studies and recommendations from neurology. Patinet had TEE - small PFO. Per Ms. Biddy, NP patient was cleared for discharge after study. She will be scheduled for LE venous doppler. OV follow-up 5-7 days.   Dictated # [id number]

## 2011-11-20 NOTE — CV Procedure (Signed)
    TRANSESOPHAGEAL ECHOCARDIOGRAM   NAME:  [name]   MRN: [id number] DOB:  1922/03/24   ADMIT DATE: 11/18/2011  INDICATIONS: CVA   PROCEDURE:   Informed consent was obtained prior to the procedure. The risks, benefits and alternatives for the procedure were discussed and the patient comprehended these risks.  Risks include, but are not limited to, cough, sore throat, vomiting, nausea, somnolence, esophageal and stomach trauma or perforation, bleeding, low blood pressure, aspiration, pneumonia, infection, trauma to the teeth and death.    After a procedural time-out, the patient was given 3 mg versed and 37.5 mcg fentanyl for moderate sedation.  The oropharynx was anesthetized 10 cc of topical 1% viscous lidocaine.  The transesophageal probe was inserted in the esophagus and stomach without difficulty and multiple views were obtained.    COMPLICATIONS:    There were no immediate complications.  FINDINGS:  LEFT VENTRICLE: EF = 60% No wall motion abnormalities. Mild basilar septal hypertrophy.  RIGHT VENTRICLE: Normal  LEFT ATRIUM: Normal  LEFT ATRIAL APPENDAGE: Small. No clot  RIGHT ATRIUM: Normal. + Eustachian valve  AORTIC VALVE:  Trileaflet. Normal. Trivial AI  MITRAL VALVE:    Normal Trivial MR.  TRICUSPID VALVE: Normal. No TR  PULMONIC VALVE: Not well visulaized  INTERATRIAL SEPTUM: Markedly aneursymal. Small PFO. With markedly + bubble study  PERICARDIUM: Normal.  DESCENDING AORTA: Moderate plaque.   CONCLUSION:  Normal LV & RV function. Normal valves. Prominent atrial septal aneurysm with small PFO and markedly + bubble study. Further management per stroke team. If patient to stay on Plavix consider changing omeprazole to pantoprazole as omeprazole may interfere with anti-platelet activity of Plavix.  Truman Hayward 4:37 PM

## 2011-11-20 NOTE — Progress Notes (Signed)
I have read and agree with below treatment note and d/c from PT. Ivonne Andrew PT, DPT Pager: [phone]

## 2011-11-20 NOTE — Progress Notes (Signed)
Pt was discharged in stable condition. Patient discharge instructions and education given and understood by pt. Pt will call for MD appointment.

## 2011-11-20 NOTE — Progress Notes (Signed)
Stroke Team Progress Note  HISTORY [name] is an 78 y.o. female history of stroke in 2007 affecting left upper extremity, hypertension and peptic ulcer disease, presenting with acute weakness involving left hand. Patient was last known normal at bedtime 11/17/2011 at 10:30 PM. She woke up with weakness and inability to use her left hand on 11/18/2011. She had no changes in speech and no facial weakness. She also had no left lower extremity symptoms. Strength has improved since admission to the hospital. NIH stroke score at this point is 0. MRI of her brain showed a small right posterior frontal cortical stroke. Patient has been on aspirin and Plavix daily. Patient was not a TPA candidate secondary to unknown time of onset. She was admitted  for further evaluation and treatment.  SUBJECTIVE No one is at the bedside.  Overall she feels her condition is rapidly almost back to normal. She is awaiting TEE.  OBJECTIVE Most recent Vital Signs: Filed Vitals:   11/19/11 2200 11/20/11 0200 11/20/11 0600 11/20/11 0928  BP: 139/74 142/49 136/51 153/63  Pulse: 65 63 62 59  Temp: 97.9 F (36.6 C) 98.1 F (36.7 C) 97 F (36.1 C) 97.9 F (36.6 C)  TempSrc: Oral Oral Oral Oral  Resp: 20 20 20 20   Height:      Weight:      SpO2: 97% 98% 98% 98%   IV Fluid Intake:      . sodium chloride 75 mL/hr at 11/18/11 2307   MEDICATIONS     . clopidogrel  75 mg Oral Q breakfast  . diltiazem  240 mg Oral Daily  . docusate sodium  100 mg Oral BID  . multivitamin with minerals  1 tablet Oral Daily  . DISCONTD: aspirin  81 mg Oral Daily   PRN:  hydrALAZINE, ondansetron (ZOFRAN) IV  Diet:  NPO for TEE Activity:  OOB DVT Prophylaxis:  SCDs   CLINICALLY SIGNIFICANT STUDIES Basic Metabolic Panel:   Lab 11/18/11 1142  NA 138  K 4.1  CL 101  CO2 26  GLUCOSE 127*  BUN 17  CREATININE 0.86  CALCIUM 9.5  MG --  PHOS --   Liver Function Tests:   Lab 11/18/11 1142  AST 17  ALT 17  ALKPHOS 144*    BILITOT 0.2*  PROT 7.4  ALBUMIN 3.8   CBC:   Lab 11/18/11 1142  WBC 7.2  NEUTROABS 5.0  HGB 15.2*  HCT 43.9  MCV 88.2  PLT 338   Coagulation:   Lab 11/18/11 1142  LABPROT 13.9  INR 1.05   Cardiac Enzymes:   Lab 11/19/11 0300  CKTOTAL --  CKMB --  CKMBINDEX --  TROPONINI <0.30   Urinalysis: No results found for this basename: COLORURINE:2,APPERANCEUR:2,LABSPEC:2,PHURINE:2,GLUCOSEU:2,HGBUR:2,BILIRUBINUR:2,KETONESUR:2,PROTEINUR:2,UROBILINOGEN:2,NITRITE:2,LEUKOCYTESUR:2 in the last 168 hours Lipid Panel    Component Value Date/Time   CHOL 188 11/19/2011 0300   TRIG 203* 11/19/2011 0300   HDL 66 11/19/2011 0300   CHOLHDL 2.8 11/19/2011 0300   VLDL 41* 11/19/2011 0300   LDLCALC 81 11/19/2011 0300   HgbA1C  Lab Results  Component Value Date   HGBA1C 6.2* 11/19/2011    Urine Drug Screen:   No results found for this basename: labopia,  cocainscrnur,  labbenz,  amphetmu,  thcu,  labbarb    Alcohol Level: No results found for this basename: ETH:2 in the last 168 hours  Ct Cervical Spine Wo Contrast  11/18/2011  No acute or traumatic finding.  Advanced degenerative facet arthropathy and spondylosis.  Multiple  foraminal stenoses could effect multiple cervical nerve roots.  Canal narrowing on the left at C4-5 and centrally at C6-7 could possibly affect the cord.      CT of the brain  11/18/2011 Atrophy.  Chronic small vessel change.  No acute or reversible findings by CT.    MRI of the brain  11/18/2011   Acute small non hemorrhagic infarct posterior right frontal lobe (motor strip).  Moderate small vessel disease type changes.  Global atrophy without hydrocephalus.    MRA of the brain  11/19/2011  Mild irregularity of the distal MCA branches suggests intracranial atherosclerotic disease.    2D Echocardiogram  EF 55-60% with no source of embolus.   Carotid Doppler  Bilateral: No evidence of hemodynamically significant internal carotid artery stenosis. Vertebral artery flow is  antegrade.    CXR  11/18/2011   1.  Stable mild chronic bronchitic changes. 2.  Interval minimal linear atelectasis or scarring at the left lung base.    EKG  normal EKG, normal sinus rhythm, unchanged from previous tracings.   Therapy Recommendations PT - outpatient; OT - none  Physical Exam   Pleasant middle aged lady not in distress.Awake alert. Afebrile. Head is nontraumatic. Neck is supple without bruit. Hearing is normal. Cardiac exam no murmur or gallop. Lungs are clear to auscultation. Distal pulses are well felt.  Neurological exam ; Awake alert oriented x 3 normal speech and language. Fundi not visualized. Vision acuity and fields are normal. Mild left lower face asymmetry. Tongue midline. No drift. Mild diminished fine finger movements on left. Orbits right over left upper extremity. Mild left grip weak.. Normal sensation . Normal coordination.   ASSESSMENT [name] is a 78 y.o. female presenting with left hand weakness. Imaging confirms a small right frontal lobe infarct. Infarct felt to be embolic, likely cardioembolic. No history of afib or irregular heart beat seen on Tele. TEE and OP tele monitoring needed to complete embolic assessment. Work up underway. On aspirin 81 mg orally every day and clopidogrel 75 mg orally every day prior to admission. Now on aspirin 81 mg orally every day and clopidogrel 75 mg orally every day for secondary stroke prevention. Patient with resultant left hand hemiparesis that is improving.   CVA hx  Hypertension  Hospital day # 2  TREATMENT/PLAN  Continue clopidogrel 75 mg orally every day for secondary stroke prevention. TEE today to look for embolic source. Arranged with [facility] for Wed, 11/20/2011.  If positive for PFO (patent foramen ovale), check bilateral lower extremity venous dopplers to rule out DVT as possible source of stroke.  If TEE negative, please schedule outpatient telemetry monitoring to assess patient for atrial  fibrillation as source of stroke. May be arranged with patient's cardiologist, or cardiologist of choice.  Ok for discharge after TEE Follow up Dr. Pearlean Brownie in 2 months  Annie Main, MSN, RN, ANVP-BC, ANP-BC, [facility] Pager: [phone] 11/20/2011 9:47 AM  Scribe for Dr. Delia Heady, Stroke Center Medical Director, who has personally reviewed chart, pertinent data, examined the patient and developed the plan of care. Pager:  [phone]

## 2011-11-20 NOTE — Evaluation (Signed)
Speech Language Pathology Evaluation Patient Details Name: CLOTILE WHITTINGTON MRN: [id number] DOB: 1922/11/29 Today's Date: 11/20/2011 Time: 1478-2956 SLP Time Calculation (min): 11 min  Problem List:  Patient Active Problem List  Diagnosis  . HYPERTENSION  . GERD  . PEPTIC ULCER DISEASE  . EFFUSION OF ANKLE AND FOOT JOINT  . EDEMA  . OTHER ABNORMAL BLOOD CHEMISTRY  . Routine health maintenance  . Cerebrovascular accident  . Elevated troponin   Past Medical History:  Past Medical History  Diagnosis Date  . Adenomatous polyps   . Pyloric stenosis   . History of UTI   . Edema   . Effusion of ankle and foot joint   . Special screening for malignant neoplasm of colon   . Observation for suspected cardiovascular disease   . Other abnormal blood chemistry   . Cerebrovascular accident   . S/P total abdominal hysterectomy   . Hypertension   . Peptic ulcer disease   . GERD (gastroesophageal reflux disease)   . Cancer 1985    Cervical Ca-no radiation or chemo   Past Surgical History:  Past Surgical History  Procedure Date  . Cyst on right hand removed   . Cataract extraction 2008    Dr Dagoberto Ligas  . Abdominal hysterectomy    HPI:  Ms. ALLISON DESHOTELS is a 78 y.o. female presenting with left hand weakness. Imaging confirms a small right frontal lobe infarct.   Assessment / Plan / Recommendation Clinical Impression  Cognitive-linguistic function is at baseline and [facility] for all tasks assessed. No f/u recommended. Please reconsult if needed.     SLP Assessment  Patient does not need any further Speech Lanaguage Pathology Services    Follow Up Recommendations  None              SLP Evaluation Prior Functioning  Cognitive/Linguistic Baseline: Within functional limits Type of Home: House Lives With: Alone Available Help at Discharge: Neighbor;Available PRN/intermittently Vocation: Retired   IT consultant  Overall Cognitive Status: Appears within functional limits for tasks  assessed    Comprehension  Auditory Comprehension Overall Auditory Comprehension: Appears within functional limits for tasks assessed Visual Recognition/Discrimination Discrimination: Within Function Limits Reading Comprehension Reading Status: Within funtional limits    Expression Expression Primary Mode of Expression: Verbal Verbal Expression Overall Verbal Expression: Appears within functional limits for tasks assessed Written Expression Dominant Hand: Right Written Expression: Not tested   Oral / Motor Oral Motor/Sensory Function Overall Oral Motor/Sensory Function: Appears within functional limits for tasks assessed Motor Speech Overall Motor Speech: Appears within functional limits for tasks assessed   GO    Ferdinand Lango MA, CCC-SLP [phone]  Leandrew Keech Meryl 11/20/2011, 10:51 AM

## 2011-11-21 ENCOUNTER — Encounter (HOSPITAL_COMMUNITY): Payer: Self-pay

## 2011-11-21 ENCOUNTER — Encounter (HOSPITAL_COMMUNITY): Payer: Self-pay | Admitting: Internal Medicine

## 2011-11-23 NOTE — Progress Notes (Signed)
Noted  Clint Lipps Pager: [phone] 11/23/2011, 11:44 AM

## 2011-11-26 ENCOUNTER — Telehealth: Payer: Self-pay | Admitting: Internal Medicine

## 2011-11-26 NOTE — Telephone Encounter (Signed)
Sorry for having misspoke - a carotid doppler was done during the hospital stay - no blockage. Does not need to be repeated.

## 2011-11-26 NOTE — Telephone Encounter (Signed)
Patient was told when she left the hospital that she would be scheduled for a doppler study and she has not heard from anyone about this, she wanted to touch base with Dr. Debby Bud

## 2011-11-27 NOTE — Telephone Encounter (Signed)
Patient notified per MD.

## 2011-12-04 ENCOUNTER — Encounter: Payer: Self-pay | Admitting: Internal Medicine

## 2011-12-04 ENCOUNTER — Ambulatory Visit (INDEPENDENT_AMBULATORY_CARE_PROVIDER_SITE_OTHER): Payer: Medicare Other | Admitting: Internal Medicine

## 2011-12-04 VITALS — BP 146/70 | HR 76 | Temp 97.5°F | Resp 16 | Ht 65.5 in | Wt 169.0 lb

## 2011-12-04 DIAGNOSIS — I6350 Cerebral infarction due to unspecified occlusion or stenosis of unspecified cerebral artery: Secondary | ICD-10-CM | POA: Diagnosis not present

## 2011-12-04 DIAGNOSIS — I10 Essential (primary) hypertension: Secondary | ICD-10-CM | POA: Diagnosis not present

## 2011-12-04 DIAGNOSIS — K279 Peptic ulcer, site unspecified, unspecified as acute or chronic, without hemorrhage or perforation: Secondary | ICD-10-CM | POA: Diagnosis not present

## 2011-12-04 DIAGNOSIS — I639 Cerebral infarction, unspecified: Secondary | ICD-10-CM

## 2011-12-04 NOTE — Assessment & Plan Note (Signed)
Patient is doing very well.  Plan -  continue ASA and plavix  Keep f/u appt with Dr. Pearlean Brownie

## 2011-12-04 NOTE — Assessment & Plan Note (Signed)
BP Readings from Last 3 Encounters:  12/04/11 146/70  11/20/11 151/60  11/20/11 151/60   subopitmal control. Will continue current medications and reevaluate medications at next OV

## 2011-12-04 NOTE — Assessment & Plan Note (Signed)
Continue PPI therapy with Protonix.

## 2011-12-04 NOTE — Progress Notes (Signed)
Subjective:    Patient ID: [name], female    DOB: October 12, 1922, 78 y.o.   MRN: [id number]  HPI [name] presents for hospital follow-up. She was admitted to [facility] with left hand weakness Sept 9 - 11, '13. Her eval revealed a small right frontal lobe CVA, TEE revealed a small PFO, 2 D echo was normal, MRA with ASVD but no occlusion and carotid dopplers were normal. She was discharged on ASA and Plavix. She was thus switched to Protonix instead of omeprazole. In the interval since d/c she has done very well and remained symptom free.  Past Medical History  Diagnosis Date  . Adenomatous polyps   . Pyloric stenosis   . History of UTI   . Edema   . Effusion of ankle and foot joint   . Special screening for malignant neoplasm of colon   . Observation for suspected cardiovascular disease   . Other abnormal blood chemistry   . Cerebrovascular accident   . S/P total abdominal hysterectomy   . Hypertension   . Peptic ulcer disease   . GERD (gastroesophageal reflux disease)   . Cancer 1985    Cervical Ca-no radiation or chemo   Past Surgical History  Procedure Date  . Cyst on right hand removed   . Cataract extraction 2008    Dr Dagoberto Ligas  . Abdominal hysterectomy   . Tee without cardioversion 11/20/2011    Procedure: TRANSESOPHAGEAL ECHOCARDIOGRAM (TEE);  Surgeon: Dolores Patty, MD;  Location: [facility] ENDOSCOPY;  Service: Cardiovascular;  Laterality: N/A;   Family History  Problem Relation Age of Onset  . Heart failure Mother   . Hypertension Mother   . Coronary artery disease Mother    History   Social History  . Marital Status: Widowed    Spouse Name: N/A    Number of Children: N/A  . Years of Education: N/A   Occupational History  . Not on file.   Social History Main Topics  . Smoking status: Former Smoker    Quit date: 03/11/1948  . Smokeless tobacco: Never Used  . Alcohol Use: Yes     Occasional wine, not very often  . Drug Use: No  . Sexually Active: No    Other Topics Concern  . Not on file   Social History Narrative   [facility], [employer]ried '50- widowed after 57 yearsLives alone in own home. On the list FHWEnd of life: No CPR, no heroic/futile treatmentPatient has never smokedAlcohol use-noDaily Caffine use-OCCIllicit Drug Use-no    Current Outpatient Prescriptions on File Prior to Visit  Medication Sig Dispense Refill  . aspirin 81 MG tablet Take 81 mg by mouth daily.        Marland Kitchen diltiazem (CARDIZEM CD) 240 MG 24 hr capsule TAKE 1 CAPSULE DAILY.  30 capsule  5  . docusate sodium (COLACE) 100 MG capsule Take 100 mg by mouth 2 (two) times daily.        . Multiple Vitamins-Minerals (CENTRUM SILVER) tablet Take 1 tablet by mouth daily.        . naproxen sodium (ANAPROX) 220 MG tablet Take 220 mg by mouth every 6 (six) hours as needed.      . pantoprazole (PROTONIX) 40 MG tablet Take 1 tablet (40 mg total) by mouth daily.  30 tablet  11  . PLAVIX 75 MG tablet TAKE (1) TABLET DAILY.  30 each  5      Review of Systems System review is negative for any constitutional, cardiac, pulmonary,  GI or neuro symptoms or complaints other than as described in the HPI.     Objective:   Physical Exam Filed Vitals:   12/04/11 0939  BP: 146/70  Pulse: 76  Temp: 97.5 F (36.4 C)  Resp: 16   gen'l- WNWD nicely groomed white woman in no distress HEENT - C&S clear Cor - RRR Pulm - normal respirations Neuro - A&O x 3, CN II-XII normal, MS good grip strength and normal throughout, normal cerebellar exam.       Assessment & Plan:

## 2011-12-04 NOTE — Patient Instructions (Addendum)
You are doing GREAT. Please continue on the plavix and aspirin. The reason for switching from omeprazole to protonix has to do with interference with the plavix.  Keep your appointment with Dr. Pearlean Brownie.  Come see me as needed or in 4 months.

## 2012-01-22 DIAGNOSIS — I6350 Cerebral infarction due to unspecified occlusion or stenosis of unspecified cerebral artery: Secondary | ICD-10-CM | POA: Diagnosis not present

## 2012-01-22 DIAGNOSIS — Q211 Atrial septal defect: Secondary | ICD-10-CM | POA: Diagnosis not present

## 2012-02-25 ENCOUNTER — Other Ambulatory Visit: Payer: Self-pay | Admitting: Internal Medicine

## 2012-04-15 DIAGNOSIS — I10 Essential (primary) hypertension: Secondary | ICD-10-CM | POA: Diagnosis not present

## 2012-04-15 DIAGNOSIS — Q211 Atrial septal defect: Secondary | ICD-10-CM | POA: Diagnosis not present

## 2012-04-15 DIAGNOSIS — I6350 Cerebral infarction due to unspecified occlusion or stenosis of unspecified cerebral artery: Secondary | ICD-10-CM | POA: Diagnosis not present

## 2012-05-22 DIAGNOSIS — H11009 Unspecified pterygium of unspecified eye: Secondary | ICD-10-CM | POA: Diagnosis not present

## 2012-05-22 DIAGNOSIS — H52209 Unspecified astigmatism, unspecified eye: Secondary | ICD-10-CM | POA: Diagnosis not present

## 2012-05-22 DIAGNOSIS — Z961 Presence of intraocular lens: Secondary | ICD-10-CM | POA: Diagnosis not present

## 2012-08-31 ENCOUNTER — Other Ambulatory Visit: Payer: Self-pay

## 2012-08-31 MED ORDER — CLOPIDOGREL BISULFATE 75 MG PO TABS: 75.0000 mg | ORAL_TABLET | Freq: Every day | ORAL | Status: DC

## 2012-08-31 MED ORDER — DILTIAZEM HCL ER COATED BEADS 240 MG PO CP24: 240.0000 mg | ORAL_CAPSULE | Freq: Every day | ORAL | Status: DC

## 2012-10-14 ENCOUNTER — Encounter: Payer: Self-pay | Admitting: Neurology

## 2012-10-14 ENCOUNTER — Ambulatory Visit (INDEPENDENT_AMBULATORY_CARE_PROVIDER_SITE_OTHER): Payer: Medicare Other | Admitting: Neurology

## 2012-10-14 DIAGNOSIS — I6529 Occlusion and stenosis of unspecified carotid artery: Secondary | ICD-10-CM | POA: Diagnosis not present

## 2012-10-14 NOTE — Patient Instructions (Addendum)
Continue Plavix for stroke prevention and strict control of hypertension with blood pressure goal below 130/90. Check followup carotid ultrasound study. Return for followup in one year or call earlier if necessary.

## 2012-10-15 NOTE — Progress Notes (Signed)
[facility] [address] [place]. [place] [address] [phone]       OFFICE FOLLOW-UP NOTE  [name] Date of Birth:  [birth year] Medical Record Number:  [id number]   HPI:  79 year old patient with right frontal embolic infarct in September 2013 without definite identified source of embolism. 10/15/2012 he returns for followup after last visit on 04/15/12. She continues to do well without recurrent stroke or TIA symptoms. He is tolerating Plavix well  without significant bleeding, bruising or other side effects. He states his blood pressure is quite well controlled though it is slightly elevated at 154/71 in the office today. He has known new neurological symptoms today.  ROS:   14 system review of systems is positive for swelling in the legs, hearing loss, shortness of breath, joint pain.  PMH:  Past Medical History  Diagnosis Date  . Adenomatous polyps   . Pyloric stenosis   . History of UTI   . Edema   . Effusion of ankle and foot joint   . Special screening for malignant neoplasm of colon   . Observation for suspected cardiovascular disease   . Other abnormal blood chemistry   . Cerebrovascular accident   . S/P total abdominal hysterectomy   . Hypertension   . Peptic ulcer disease   . GERD (gastroesophageal reflux disease)   . Cancer 1985    Cervical Ca-no radiation or chemo    Social History:  History   Social History  . Marital Status: Widowed    Spouse Name: N/A    Number of Children: N/A  . Years of Education: N/A   Occupational History  . Not on file.   Social History Main Topics  . Smoking status: Former Smoker    Quit date: 03/11/1948  . Smokeless tobacco: Never Used  . Alcohol Use: Yes     Comment: Occasional wine, not very often  . Drug Use: No  . Sexually Active: No   Other Topics Concern  . Not on file   Social History Narrative   HSG, [employer]   Married '50- widowed after 57 years   Lives alone in own home. On the list  FHW   End of life: No CPR, no heroic/futile treatment   Patient has never smoked   Alcohol use-no   Daily Caffine use-OCC   Illicit Drug Use-no    Medications:   Current Outpatient Prescriptions on File Prior to Visit  Medication Sig Dispense Refill  . clopidogrel (PLAVIX) 75 MG tablet Take 1 tablet (75 mg total) by mouth daily.  30 tablet  5  . diltiazem (CARDIZEM CD) 240 MG 24 hr capsule Take 1 capsule (240 mg total) by mouth daily.  30 capsule  5  . docusate sodium (COLACE) 100 MG capsule Take 100 mg by mouth 2 (two) times daily.        . Multiple Vitamins-Minerals (CENTRUM SILVER) tablet Take 1 tablet by mouth daily.        . naproxen sodium (ANAPROX) 220 MG tablet Take 220 mg by mouth every 6 (six) hours as needed.      . pantoprazole (PROTONIX) 40 MG tablet Take 1 tablet (40 mg total) by mouth daily.  30 tablet  11  . aspirin 81 MG tablet Take 81 mg by mouth daily.         No current facility-administered medications on file prior to visit.    Allergies:  No Known Allergies  Physical Exam General: well developed, well  nourished, seated, in no evident distress Head: head normocephalic and atraumatic. Orohparynx benign Neck: supple with no carotid or supraclavicular bruits Cardiovascular: regular rate and rhythm, no murmurs Musculoskeletal: no deformity Skin:  no rash/petichiae Vascular:  Normal pulses all extremities Filed Vitals:   10/14/12 1436  BP: 154/71  Pulse: 77  Temp: 97.2 F (36.2 C)    Neurologic Exam Mental Status: Awake and fully alert. Oriented to place and time. Recent and remote memory intact. Attention span, concentration and fund of knowledge appropriate. Mood and affect appropriate.  Cranial Nerves: Fundoscopic exam reveals sharp disc margins. Pupils equal, briskly reactive to light. Extraocular movements full without nystagmus. Visual fields full to confrontation. Hearing intact. Facial sensation intact. Face, tongue, palate moves normally and  symmetrically.  Motor: Normal bulk and tone. Normal strength in all tested extremity muscles. Diminished fine finger movements on the right. Sensory.: intact to tough and pinprick and vibratory.  Coordination: Rapid alternating movements normal in all extremities. Finger-to-nose and heel-to-shin performed accurately bilaterally. Gait and Station: Arises from chair without difficulty. Stance is normal. Gait demonstrates normal stride length and balance . Able to heel, toe and tandem walk without difficulty.  Reflexes: 1+ and symmetric. Toes downgoing.   NIHSS  0 Modified Rankin  1   ASSESSMENT: 79 year old patient with right frontal embolic infarct in September 2013 without definite identified source of embolism.    PLAN:  Continue Plavix for stroke prevention and strict control of hypertension with blood pressure goal below 130/90. Check followup carotid ultrasound study. Return for followup in one year or call earlier if necessary.

## 2012-10-20 ENCOUNTER — Other Ambulatory Visit: Payer: Medicare Other

## 2012-10-27 ENCOUNTER — Ambulatory Visit (INDEPENDENT_AMBULATORY_CARE_PROVIDER_SITE_OTHER): Payer: Medicare Other

## 2012-10-27 DIAGNOSIS — I6529 Occlusion and stenosis of unspecified carotid artery: Secondary | ICD-10-CM | POA: Diagnosis not present

## 2012-11-18 ENCOUNTER — Other Ambulatory Visit: Payer: Self-pay | Admitting: Internal Medicine

## 2012-11-23 ENCOUNTER — Telehealth: Payer: Self-pay | Admitting: Radiology

## 2012-12-14 ENCOUNTER — Ambulatory Visit: Payer: Medicare Other | Admitting: Internal Medicine

## 2012-12-16 ENCOUNTER — Encounter: Payer: Self-pay | Admitting: Internal Medicine

## 2012-12-16 ENCOUNTER — Other Ambulatory Visit: Payer: Medicare Other

## 2012-12-16 ENCOUNTER — Ambulatory Visit (INDEPENDENT_AMBULATORY_CARE_PROVIDER_SITE_OTHER): Payer: Medicare Other | Admitting: Internal Medicine

## 2012-12-16 VITALS — BP 160/76 | HR 65 | Temp 97.2°F | Wt 173.4 lb

## 2012-12-16 DIAGNOSIS — I10 Essential (primary) hypertension: Secondary | ICD-10-CM

## 2012-12-16 DIAGNOSIS — Z7189 Other specified counseling: Secondary | ICD-10-CM | POA: Diagnosis not present

## 2012-12-16 DIAGNOSIS — I639 Cerebral infarction, unspecified: Secondary | ICD-10-CM

## 2012-12-16 DIAGNOSIS — K219 Gastro-esophageal reflux disease without esophagitis: Secondary | ICD-10-CM | POA: Diagnosis not present

## 2012-12-16 DIAGNOSIS — I6350 Cerebral infarction due to unspecified occlusion or stenosis of unspecified cerebral artery: Secondary | ICD-10-CM | POA: Diagnosis not present

## 2012-12-16 LAB — COMPREHENSIVE METABOLIC PANEL
Albumin: 4 g/dL (ref 3.5–5.2)
CO2: 26 mEq/L (ref 19–32)
Calcium: 9.1 mg/dL (ref 8.4–10.5)
Chloride: 106 mEq/L (ref 96–112)
GFR: 65.34 mL/min (ref >60.00)
Glucose, Bld: 109 mg/dL — ABNORMAL HIGH (ref 70–99)
Potassium: 4.6 mEq/L (ref 3.5–5.1)
Sodium: 142 mEq/L (ref 135–145)
Total Protein: 7.1 g/dL (ref 6.0–8.3)

## 2012-12-16 MED ORDER — FUROSEMIDE 20 MG PO TABS: 20.0000 mg | ORAL_TABLET | Freq: Every day | ORAL | Status: DC

## 2012-12-16 NOTE — Patient Instructions (Signed)
Thanks for coming to see me.  You seem to be doing GREAT  Your blood pressure is too high. The Goal is a blood pressure of 130/80 or better Plan Continue your present medication - diltiazem  Add furosemide (a water pill) to take once a day  Come back for a nurse visit in 3-4 weeks after starting the furosemide   Advance Care Planning - have the Health Care Power of Attorney from completed and notarized  Keep a clear plastic envelop on the back of the front door or on the refrigerator with the yellow on top, pink behind and the HCPOA in the back.  Will check labs tosday and results will be posted to MyChart  See me when you need to. See Georgiann Hahn in 3-4 weeks for blood pressure check ( bring your meter from home).

## 2012-12-16 NOTE — Progress Notes (Signed)
Subjective:    Patient ID: [name], female    DOB: 1923/01/22, 79 y.o.   MRN: [id number]  HPI [name] presents for follow up. She was last seen when in hospital last Sept for an embolic frontal stroke from which she made a great recovery. She has seen Dr. Pearlean Brownie in August '14 with a good report. She had carotid doppler that was normal. Since that time she has been feeling well and doing well.   Past Medical History  Diagnosis Date  . Adenomatous polyps   . Pyloric stenosis   . History of UTI   . Edema   . Effusion of ankle and foot joint   . Special screening for malignant neoplasm of colon   . Observation for suspected cardiovascular disease   . Other abnormal blood chemistry   . Cerebrovascular accident   . S/P total abdominal hysterectomy   . Hypertension   . Peptic ulcer disease   . GERD (gastroesophageal reflux disease)   . Cancer 1985    Cervical Ca-no radiation or chemo   Past Surgical History  Procedure Laterality Date  . Cyst on right hand removed    . Cataract extraction  2008    Dr Dagoberto Ligas  . Abdominal hysterectomy    . Tee without cardioversion  11/20/2011    Procedure: TRANSESOPHAGEAL ECHOCARDIOGRAM (TEE);  Surgeon: Dolores Patty, MD;  Location: [facility] ENDOSCOPY;  Service: Cardiovascular;  Laterality: N/A;   Family History  Problem Relation Age of Onset  . Heart failure Mother   . Hypertension Mother   . Coronary artery disease Mother    History   Social History  . Marital Status: Widowed    Spouse Name: N/A    Number of Children: N/A  . Years of Education: N/A   Occupational History  . Not on file.   Social History Main Topics  . Smoking status: Former Smoker    Quit date: 03/11/1948  . Smokeless tobacco: Never Used  . Alcohol Use: Yes     Comment: Occasional wine, not very often  . Drug Use: No  . Sexual Activity: No   Other Topics Concern  . Not on file   Social History Narrative   HSG, [employer]   Married '50- widowed after 57  years   Lives alone in own home. On the list FHW   End of life: No CPR, no heroic/futile treatment   Patient has never smoked   Alcohol use-no   Daily Caffine use-OCC   Illicit Drug Use-no    Current Outpatient Prescriptions on File Prior to Visit  Medication Sig Dispense Refill  . clopidogrel (PLAVIX) 75 MG tablet Take 1 tablet (75 mg total) by mouth daily.  30 tablet  5  . diltiazem (CARDIZEM CD) 240 MG 24 hr capsule Take 1 capsule (240 mg total) by mouth daily.  30 capsule  5  . docusate sodium (COLACE) 100 MG capsule Take 100 mg by mouth 2 (two) times daily.        . Multiple Vitamins-Minerals (CENTRUM SILVER) tablet Take 1 tablet by mouth daily.        . naproxen sodium (ANAPROX) 220 MG tablet Take 220 mg by mouth every 6 (six) hours as needed.      . pantoprazole (PROTONIX) 40 MG tablet TAKE 1 TABLET EACH DAY.  30 tablet  0   No current facility-administered medications on file prior to visit.      Review of Systems Constitutional:  Negative for  fever, chills, activity change and unexpected weight change.  HEENT:  Negative for hearing loss, ear pain, congestion, neck stiffness and postnasal drip. Negative for sore throat or swallowing problems. Negative for dental complaints.   Eyes: Negative for vision loss or change in visual acuity.  Respiratory: Negative for chest tightness and wheezing. Negative for DOE.   Cardiovascular: Negative for chest pain or palpitations. No decreased exercise tolerance Gastrointestinal: No change in bowel habit. No bloating or gas. No reflux or indigestion Genitourinary: Negative for urgency, frequency, flank pain and difficulty urinating.  Musculoskeletal: Negative for myalgias, back pain, arthralgias and gait problem.  Neurological: Negative for dizziness, tremors, weakness and headaches.  Hematological: Negative for adenopathy.  Psychiatric/Behavioral: Negative for behavioral problems and dysphoric mood.       Objective:   Physical  Exam Filed Vitals:   12/16/12 1034  BP: 160/76  Pulse: 65  Temp: 97.2 F (36.2 C)   Wt Readings from Last 3 Encounters:  12/16/12 173 lb 6.4 oz (78.654 kg)  10/14/12 177 lb (80.287 kg)  04/15/12 171 lb 3.2 oz (77.656 kg)   BP Readings from Last 3 Encounters:  12/16/12 160/76  10/14/12 154/71  04/15/12 169/75   Gen'l - overweight woman in o distress who looks younger than her stated age.  HEENT- C&S clear Cor - 2+ radial pulse, RRR Pulm - normal respirations Neuro - A&O x 3, CN II-XII grossly normal, MS - normal, able to stand w/o assist, cerebellar - no tremor, normal gait.        Assessment & Plan:

## 2012-12-17 DIAGNOSIS — Z7189 Other specified counseling: Secondary | ICD-10-CM | POA: Insufficient documentation

## 2012-12-17 NOTE — Assessment & Plan Note (Signed)
BP Readings from Last 3 Encounters:  12/16/12 160/76  10/14/12 154/71  04/15/12 169/75   Suboptimal control with a goal of SBP <130 as part of stroke prevention in patient s/p CVA  Plan Add furosemide 20 mg daily  Recheck BP 3 weeks - nurse visit

## 2012-12-17 NOTE — Assessment & Plan Note (Signed)
Taking medication. Has no complaints of symptoms.  Plan Continue PPI therapy

## 2012-12-17 NOTE — Assessment & Plan Note (Signed)
S/p posterior right frontal lob embolic stroke with an excellent recovery - no sequelae. She has recently seen Dr. Pearlean Brownie who ordered carotid doppler - reveals no obstructive disease. She is to continue on Plavix which she is tolerating well.  Plan Continue anticoagulation  Risk reduction

## 2012-12-17 NOTE — Assessment & Plan Note (Signed)
Oct '14: completed "Out of Facility Order," completed and signed MOST: DNR, intermediate care-no ICU, antibiotics if needed, IVF trial if needed, no tube feed. She is provided HCPOA form- see social history for contact info. Directed to [employer] and [employer]. Her family knows her wishes.

## 2012-12-21 ENCOUNTER — Other Ambulatory Visit: Payer: Self-pay | Admitting: Internal Medicine

## 2013-01-14 ENCOUNTER — Other Ambulatory Visit: Payer: Self-pay

## 2013-02-05 ENCOUNTER — Telehealth: Payer: Self-pay

## 2013-02-05 NOTE — Telephone Encounter (Signed)
Phone call to patient and left a message letting her know that she is due for a Prevnar vaccine.

## 2013-02-28 ENCOUNTER — Other Ambulatory Visit: Payer: Self-pay | Admitting: Internal Medicine

## 2013-03-08 ENCOUNTER — Other Ambulatory Visit: Payer: Self-pay | Admitting: Internal Medicine

## 2013-03-08 ENCOUNTER — Telehealth: Payer: Self-pay | Admitting: Internal Medicine

## 2013-03-08 DIAGNOSIS — H9190 Unspecified hearing loss, unspecified ear: Secondary | ICD-10-CM

## 2013-03-08 NOTE — Telephone Encounter (Signed)
Pt is requesting a referral to the hearing clinic for Dr. Micah Noel for hearing to be checked.

## 2013-03-08 NOTE — Telephone Encounter (Signed)
Referral request to [facility]

## 2013-03-09 NOTE — Telephone Encounter (Signed)
Pt is aware.  

## 2013-03-24 ENCOUNTER — Telehealth: Payer: Self-pay | Admitting: *Deleted

## 2013-03-24 DIAGNOSIS — H9190 Unspecified hearing loss, unspecified ear: Secondary | ICD-10-CM

## 2013-03-24 NOTE — Telephone Encounter (Signed)
Order sent to [facility] 

## 2013-03-24 NOTE — Telephone Encounter (Signed)
Denise with [facility] phoned needing an order for a hearing eval referral.  Fax [phone]  Please advise  CB# [phone]

## 2013-04-21 ENCOUNTER — Ambulatory Visit (INDEPENDENT_AMBULATORY_CARE_PROVIDER_SITE_OTHER): Payer: Medicare Other | Admitting: Internal Medicine

## 2013-04-21 ENCOUNTER — Encounter: Payer: Self-pay | Admitting: Internal Medicine

## 2013-04-21 VITALS — BP 148/80 | HR 72 | Temp 97.7°F | Wt 175.8 lb

## 2013-04-21 DIAGNOSIS — K921 Melena: Secondary | ICD-10-CM | POA: Diagnosis not present

## 2013-04-21 MED ORDER — FUROSEMIDE 20 MG PO TABS: 20.0000 mg | ORAL_TABLET | Freq: Every day | ORAL | Status: DC

## 2013-04-21 NOTE — Progress Notes (Signed)
Pre visit review using our clinic review tool, if applicable. No additional management support is needed unless otherwise documented below in the visit note. 

## 2013-04-21 NOTE — Patient Instructions (Addendum)
Hematochezia - bright red blood per rectum: based on your symptoms, the amount of blood and the presence of clots and known diverticulosis by colonoscopy, this sound like you have had a diverticular bleed that is stopping on its own, as they most often do. You have no symptoms to suggest major blood loss, thus we will not check a blood count today.  Plan Watchful waiting: if you have continued bleeding then we may need to have the GI doctors take a look.  Alarm signs - large amount of bleeding, even between BMs; weakness or shortness of breath or chest pain associated with blooding -go to ED.  Diverticulosis Diverticulosis is a common condition that develops when small pouches (diverticula) form in the wall of the colon. The risk of diverticulosis increases with age. It happens more often in people who eat a low-fiber diet. Most individuals with diverticulosis have no symptoms. Those individuals with symptoms usually experience abdominal pain, constipation, or loose stools (diarrhea). HOME CARE INSTRUCTIONS   Increase the amount of fiber in your diet as directed by your caregiver or dietician. This may reduce symptoms of diverticulosis.  Your caregiver may recommend taking a dietary fiber supplement.  Drink at least 6 to 8 glasses of water each day to prevent constipation.  Try not to strain when you have a bowel movement.  Your caregiver may recommend avoiding nuts and seeds to prevent complications, although this is still an uncertain benefit.  Only take over-the-counter or prescription medicines for pain, discomfort, or fever as directed by your caregiver. FOODS WITH HIGH FIBER CONTENT INCLUDE:  Fruits. Apple, peach, pear, tangerine, raisins, prunes.  Vegetables. Brussels sprouts, asparagus, broccoli, cabbage, carrot, cauliflower, romaine lettuce, spinach, summer squash, tomato, winter squash, zucchini.  Starchy Vegetables. Baked beans, kidney beans, lima beans, split peas, lentils,  potatoes (with skin).  Grains. Whole wheat bread, brown rice, bran flake cereal, plain oatmeal, white rice, shredded wheat, bran muffins. SEEK IMMEDIATE MEDICAL CARE IF:   You develop increasing pain or severe bloating.  You have an oral temperature above 102 F (38.9 C), not controlled by medicine.  You develop vomiting or bowel movements that are bloody or black. Document Released: 11/23/2003 Document Revised: 05/20/2011 Document Reviewed: 07/26/2009 [facility] Patient Information 2014 [place].

## 2013-04-21 NOTE — Progress Notes (Signed)
Subjective:    Patient ID: [name], female    DOB: 15-Apr-1922, 80 y.o.   MRN: [id number]  HPI Sunday after a good BM she had blood on the wipe and in the commode there was blood. Monday she had a second episode of blood when cleaning and in the commode. Tuesday she did not have a BM but she did have blood when sitting on the commode. She does report that there have been some blood clots with her BM. Chart reviewed - at last colonoscopy she was noted to have diverticulosis, no internal hemorrhoids reported. She denies c/p, SOB, near-syncope, weakness, abdominal or rectal pain.  Past Medical History  Diagnosis Date  . Adenomatous polyps   . Pyloric stenosis   . History of UTI   . Edema   . Effusion of ankle and foot joint   . Special screening for malignant neoplasm of colon   . Observation for suspected cardiovascular disease   . Other abnormal blood chemistry   . Cerebrovascular accident   . S/P total abdominal hysterectomy   . Hypertension   . Peptic ulcer disease   . GERD (gastroesophageal reflux disease)   . Cancer 1985    Cervical Ca-no radiation or chemo   Past Surgical History  Procedure Laterality Date  . Cyst on right hand removed    . Cataract extraction  2008    Dr Kathrin Penner  . Abdominal hysterectomy    . Tee without cardioversion  11/20/2011    Procedure: TRANSESOPHAGEAL ECHOCARDIOGRAM (TEE);  Surgeon: Jolaine Artist, MD;  Location: [facility] ENDOSCOPY;  Service: Cardiovascular;  Laterality: N/A;   Family History  Problem Relation Age of Onset  . Heart failure Mother   . Hypertension Mother   . Coronary artery disease Mother    History   Social History  . Marital Status: Widowed    Spouse Name: N/A    Number of Children: N/A  . Years of Education: N/A   Occupational History  . Not on file.   Social History Main Topics  . Smoking status: Former Smoker    Quit date: 03/11/1948  . Smokeless tobacco: Never Used  . Alcohol Use: Yes     Comment:  Occasional wine, not very often  . Drug Use: No  . Sexual Activity: No   Other Topics Concern  . Not on file   Social History Narrative   [employer], [employer]. Married '50- widowed after 35 years.Lives alone in own home. On the list [facility]   ACP/End of life: No CPR, no heroic/futile treatment, i.e. prolonged ICU care. HCPA sister [name] tele [phone].           Current Outpatient Prescriptions on File Prior to Visit  Medication Sig Dispense Refill  . clopidogrel (PLAVIX) 75 MG tablet TAKE (1) TABLET DAILY.  30 tablet  5  . diltiazem (CARDIZEM CD) 240 MG 24 hr capsule TAKE 1 CAPSULE DAILY.  30 capsule  5  . docusate sodium (COLACE) 100 MG capsule Take 100 mg by mouth 2 (two) times daily.        . Multiple Vitamins-Minerals (CENTRUM SILVER) tablet Take 1 tablet by mouth daily.        . naproxen sodium (ANAPROX) 220 MG tablet Take 220 mg by mouth every 6 (six) hours as needed.      . pantoprazole (PROTONIX) 40 MG tablet TAKE 1 TABLET EACH DAY.  30 tablet  5   No current facility-administered medications on file prior to  visit.      Review of Systems System review is negative for any constitutional, cardiac, pulmonary, GI or neuro symptoms or complaints other than as described in the HPI.     Objective:   Physical Exam Filed Vitals:   04/21/13 1131  BP: 148/80  Pulse: 72  Temp: 97.7 F (36.5 C)   BP Readings from Last 3 Encounters:  04/21/13 148/80  12/16/12 160/76  10/14/12 154/71   Gen'l - a very bright woman in no distress who looks younger than her stated age. HEENT_ anicteric sclera, PERRLA Cor 2+ radial pulse, RRR Pulm - normal respirations Neuro - Awake and alert, normal cognition, normal GU&G with a steady gait.        Assessment & Plan:  Hematochezia - moderate amount of bleeding with BM that is tapering off. No prior h/o LGI bleed. By colonoscopy she does have diverticulosis. Most likely diagnosis - diverticular bleed limited, now with minimal continued  blood loss. Full explanation with cartoon provided. She is asymptomatic.  Plan Watchful waiting  Being asymptomatic will not obtain H/H  Alarm signs detailed (see AVS)

## 2013-05-24 DIAGNOSIS — H52209 Unspecified astigmatism, unspecified eye: Secondary | ICD-10-CM | POA: Diagnosis not present

## 2013-05-24 DIAGNOSIS — H11009 Unspecified pterygium of unspecified eye: Secondary | ICD-10-CM | POA: Diagnosis not present

## 2013-05-24 DIAGNOSIS — H02839 Dermatochalasis of unspecified eye, unspecified eyelid: Secondary | ICD-10-CM | POA: Diagnosis not present

## 2013-05-24 DIAGNOSIS — Z961 Presence of intraocular lens: Secondary | ICD-10-CM | POA: Diagnosis not present

## 2013-06-22 ENCOUNTER — Other Ambulatory Visit: Payer: Self-pay | Admitting: *Deleted

## 2013-06-22 ENCOUNTER — Telehealth: Payer: Self-pay | Admitting: Internal Medicine

## 2013-06-22 MED ORDER — PANTOPRAZOLE SODIUM 40 MG PO TBEC: DELAYED_RELEASE_TABLET | ORAL | Status: DC

## 2013-06-22 NOTE — Telephone Encounter (Signed)
Patient states that [facility] sent a refill request for her pantoprazole (PROTONIX) 40 MG medication. Can she have this refilled. Gave pt information for [place] office to call and establish with new PCP.

## 2013-06-23 ENCOUNTER — Telehealth: Payer: Self-pay | Admitting: Internal Medicine

## 2013-06-23 NOTE — Telephone Encounter (Signed)
Ok to fill -done

## 2013-06-23 NOTE — Telephone Encounter (Signed)
Pt is was a [facility] pt and would like to switch to you.  She is a medicare pt. Will you accept?

## 2013-06-30 NOTE — Telephone Encounter (Signed)
Ok to Add to lsit of accepted  Transfer patients

## 2013-07-14 ENCOUNTER — Ambulatory Visit (INDEPENDENT_AMBULATORY_CARE_PROVIDER_SITE_OTHER): Payer: Medicare Other | Admitting: Physician Assistant

## 2013-07-14 ENCOUNTER — Encounter: Payer: Self-pay | Admitting: Physician Assistant

## 2013-07-14 VITALS — BP 130/86 | HR 76 | Temp 97.6°F | Resp 16 | Wt 165.0 lb

## 2013-07-14 DIAGNOSIS — I639 Cerebral infarction, unspecified: Secondary | ICD-10-CM

## 2013-07-14 DIAGNOSIS — I10 Essential (primary) hypertension: Secondary | ICD-10-CM

## 2013-07-14 DIAGNOSIS — I6350 Cerebral infarction due to unspecified occlusion or stenosis of unspecified cerebral artery: Secondary | ICD-10-CM

## 2013-07-14 DIAGNOSIS — K219 Gastro-esophageal reflux disease without esophagitis: Secondary | ICD-10-CM | POA: Diagnosis not present

## 2013-07-14 MED ORDER — DILTIAZEM HCL ER COATED BEADS 240 MG PO CP24: 240.0000 mg | ORAL_CAPSULE | Freq: Every day | ORAL | Status: DC

## 2013-07-14 MED ORDER — CLOPIDOGREL BISULFATE 75 MG PO TABS: 75.0000 mg | ORAL_TABLET | Freq: Once | ORAL | Status: DC

## 2013-07-14 MED ORDER — PANTOPRAZOLE SODIUM 40 MG PO TBEC: 40.0000 mg | DELAYED_RELEASE_TABLET | Freq: Every day | ORAL | Status: DC

## 2013-07-14 NOTE — Progress Notes (Signed)
Pre visit review using our clinic review tool, if applicable. No additional management support is needed unless otherwise documented below in the visit note. 

## 2013-07-14 NOTE — Patient Instructions (Signed)
You now have 5 refills each of your Diltiazem, your Clopidogrel, and your Pantoprazole. This should last you until you get to Establish with Dr. Yong Channel.  Continue exercising and dieting as you have been doing. You have had wonderful success so far and both your weight and blood pressure reflect this.  Followup to clinic as needed.  Health Maintenance, Female A healthy lifestyle and preventative care can promote health and wellness.  Maintain regular health, dental, and eye exams.  Eat a healthy diet. Foods like vegetables, fruits, whole grains, low-fat dairy products, and lean protein foods contain the nutrients you need without too many calories. Decrease your intake of foods high in solid fats, added sugars, and salt. Get information about a proper diet from your caregiver, if necessary.  Regular physical exercise is one of the most important things you can do for your health. Most adults should get at least 150 minutes of moderate-intensity exercise (any activity that increases your heart rate and causes you to sweat) each week. In addition, most adults need muscle-strengthening exercises on 2 or more days a week.   Maintain a healthy weight. The body mass index (BMI) is a screening tool to identify possible weight problems. It provides an estimate of body fat based on height and weight. Your caregiver can help determine your BMI, and can help you achieve or maintain a healthy weight. For adults 20 years and older:  A BMI below 18.5 is considered underweight.  A BMI of 18.5 to 24.9 is normal.  A BMI of 25 to 29.9 is considered overweight.  A BMI of 30 and above is considered obese.  Maintain normal blood lipids and cholesterol by exercising and minimizing your intake of saturated fat. Eat a balanced diet with plenty of fruits and vegetables. Blood tests for lipids and cholesterol should begin at age 4 and be repeated every 5 years. If your lipid or cholesterol levels are high, you are  over 50, or you are a high risk for heart disease, you may need your cholesterol levels checked more frequently.Ongoing high lipid and cholesterol levels should be treated with medicines if diet and exercise are not effective.  If you smoke, find out from your caregiver how to quit. If you do not use tobacco, do not start.  Lung cancer screening is recommended for adults aged 27 80 years who are at high risk for developing lung cancer because of a history of smoking. Yearly low-dose computed tomography (CT) is recommended for people who have at least a 30-pack-year history of smoking and are a current smoker or have quit within the past 15 years. A pack year of smoking is smoking an average of 1 pack of cigarettes a day for 1 year (for example: 1 pack a day for 30 years or 2 packs a day for 15 years). Yearly screening should continue until the smoker has stopped smoking for at least 15 years. Yearly screening should also be stopped for people who develop a health problem that would prevent them from having lung cancer treatment.  If you are pregnant, do not drink alcohol. If you are breastfeeding, be very cautious about drinking alcohol. If you are not pregnant and choose to drink alcohol, do not exceed 1 drink per day. One drink is considered to be 12 ounces (355 mL) of beer, 5 ounces (148 mL) of wine, or 1.5 ounces (44 mL) of liquor.  Avoid use of street drugs. Do not share needles with anyone. Ask for help if  you need support or instructions about stopping the use of drugs.  High blood pressure causes heart disease and increases the risk of stroke. Blood pressure should be checked at least every 1 to 2 years. Ongoing high blood pressure should be treated with medicines, if weight loss and exercise are not effective.  If you are 46 to 78 years old, ask your caregiver if you should take aspirin to prevent strokes.  Diabetes screening involves taking a blood sample to check your fasting blood sugar  level. This should be done once every 3 years, after age 65, if you are within normal weight and without risk factors for diabetes. Testing should be considered at a younger age or be carried out more frequently if you are overweight and have at least 1 risk factor for diabetes.  Breast cancer screening is essential preventative care for women. You should practice "breast self-awareness." This means understanding the normal appearance and feel of your breasts and may include breast self-examination. Any changes detected, no matter how small, should be reported to a caregiver. Women in their 33s and 30s should have a clinical breast exam (CBE) by a caregiver as part of a regular health exam every 1 to 3 years. After age 43, women should have a CBE every year. Starting at age 45, women should consider having a mammogram (breast X-ray) every year. Women who have a family history of breast cancer should talk to their caregiver about genetic screening. Women at a high risk of breast cancer should talk to their caregiver about having an MRI and a mammogram every year.  Breast cancer gene (BRCA)-related cancer risk assessment is recommended for women who have family members with BRCA-related cancers. BRCA-related cancers include breast, ovarian, tubal, and peritoneal cancers. Having family members with these cancers may be associated with an increased risk for harmful changes (mutations) in the breast cancer genes BRCA1 and BRCA2. Results of the assessment will determine the need for genetic counseling and BRCA1 and BRCA2 testing.  The Pap test is a screening test for cervical cancer. Women should have a Pap test starting at age 68. Between ages 59 and 86, Pap tests should be repeated every 2 years. Beginning at age 28, you should have a Pap test every 3 years as long as the past 3 Pap tests have been normal. If you had a hysterectomy for a problem that was not cancer or a condition that could lead to cancer, then  you no longer need Pap tests. If you are between ages 2 and 25, and you have had normal Pap tests going back 10 years, you no longer need Pap tests. If you have had past treatment for cervical cancer or a condition that could lead to cancer, you need Pap tests and screening for cancer for at least 20 years after your treatment. If Pap tests have been discontinued, risk factors (such as a new sexual partner) need to be reassessed to determine if screening should be resumed. Some women have medical problems that increase the chance of getting cervical cancer. In these cases, your caregiver may recommend more frequent screening and Pap tests.  The human papillomavirus (HPV) test is an additional test that may be used for cervical cancer screening. The HPV test looks for the virus that can cause the cell changes on the cervix. The cells collected during the Pap test can be tested for HPV. The HPV test could be used to screen women aged 20 years and older, and should  be used in women of any age who have unclear Pap test results. After the age of 44, women should have HPV testing at the same frequency as a Pap test.  Colorectal cancer can be detected and often prevented. Most routine colorectal cancer screening begins at the age of 36 and continues through age 25. However, your caregiver may recommend screening at an earlier age if you have risk factors for colon cancer. On a yearly basis, your caregiver may provide home test kits to check for hidden blood in the stool. Use of a small camera at the end of a tube, to directly examine the colon (sigmoidoscopy or colonoscopy), can detect the earliest forms of colorectal cancer. Talk to your caregiver about this at age 58, when routine screening begins. Direct examination of the colon should be repeated every 5 to 10 years through age 3, unless early forms of pre-cancerous polyps or small growths are found.  Hepatitis C blood testing is recommended for all people born  from [birth year] through 1965 and any individual with known risks for hepatitis C.  Practice safe sex. Use condoms and avoid high-risk sexual practices to reduce the spread of sexually transmitted infections (STIs). Sexually active women aged 52 and younger should be checked for Chlamydia, which is a common sexually transmitted infection. Older women with new or multiple partners should also be tested for Chlamydia. Testing for other STIs is recommended if you are sexually active and at increased risk.  Osteoporosis is a disease in which the bones lose minerals and strength with aging. This can result in serious bone fractures. The risk of osteoporosis can be identified using a bone density scan. Women ages 14 and over and women at risk for fractures or osteoporosis should discuss screening with their caregivers. Ask your caregiver whether you should be taking a calcium supplement or vitamin D to reduce the rate of osteoporosis.  Menopause can be associated with physical symptoms and risks. Hormone replacement therapy is available to decrease symptoms and risks. You should talk to your caregiver about whether hormone replacement therapy is right for you.  Use sunscreen. Apply sunscreen liberally and repeatedly throughout the day. You should seek shade when your shadow is shorter than you. Protect yourself by wearing long sleeves, pants, a wide-brimmed hat, and sunglasses year round, whenever you are outdoors.  Notify your caregiver of new moles or changes in moles, especially if there is a change in shape or color. Also notify your caregiver if a mole is larger than the size of a pencil eraser.  Stay current with your immunizations. Document Released: 09/10/2010 Document Revised: 06/22/2012 Document Reviewed: 09/10/2010 [facility] Patient Information 2014 [employer].

## 2013-07-14 NOTE — Progress Notes (Signed)
Subjective:    Patient ID: [name], female    DOB: January 08, 1923, 80 y.o.   MRN: [id number]  HPI Patient is an 80 year old Caucasian female with history of hypertension, GERD, and history of TIA presenting to the clinic today for medication refill. She is a former Dr. Linda Hedges patient and she will be establishing with Dr. Yong Channel this summer. She states that she has been told by [facility] that she is running out of refills for her diltiazem, clopidogrel, and pantoprazole. She recently had her pantoprazole refilled for 2 months, however it is unlikely that she will be able to establish before this runs out, and her other 2 medications have only one refill remaining. She states that she tolerates all of these medications very well and has never had side effects from them. Her conditions appear to be well controlled with these medications.   She also presents with a 10 pound intentional weight loss over the past 2 months. She states that she has cut out sugar in soft drinks, and has begun exercising and walking. She states that she feels healthier than ever, and also that her blood pressure readings have been a lot better since losing the weight. She denies fevers, chills, nausea, vomiting, diarrhea, chest pain, dizziness, and shortness of breath.   Review of Systems As per the history of present illness and are otherwise negative.  Past Medical History  Diagnosis Date  . Adenomatous polyps   . Pyloric stenosis   . History of UTI   . Edema   . Effusion of ankle and foot joint   . Special screening for malignant neoplasm of colon   . Observation for suspected cardiovascular disease   . Other abnormal blood chemistry   . Cerebrovascular accident   . S/P total abdominal hysterectomy   . Hypertension   . Peptic ulcer disease   . GERD (gastroesophageal reflux disease)   . Cancer 1985    Cervical Ca-no radiation or chemo   Past Surgical History  Procedure Laterality Date  . Cyst on  right hand removed    . Cataract extraction  2008    Dr Kathrin Penner  . Abdominal hysterectomy    . Tee without cardioversion  11/20/2011    Procedure: TRANSESOPHAGEAL ECHOCARDIOGRAM (TEE);  Surgeon: Jolaine Artist, MD;  Location: [facility] ENDOSCOPY;  Service: Cardiovascular;  Laterality: N/A;    reports that she quit smoking about 65 years ago. She has never used smokeless tobacco. She reports that she drinks alcohol. She reports that she does not use illicit drugs. family history includes Coronary artery disease in her mother; Heart failure in her mother; Hypertension in her mother. No Known Allergies     Objective:   Physical Exam  Nursing note and vitals reviewed. Constitutional: She is oriented to person, place, and time. She appears well-developed and well-nourished. No distress.  HENT:  Head: Normocephalic and atraumatic.  Eyes: Conjunctivae and EOM are normal. Pupils are equal, round, and reactive to light.  Neck: Normal range of motion. Neck supple.  Cardiovascular: Normal rate, regular rhythm, normal heart sounds and intact distal pulses.  Exam reveals no gallop and no friction rub.   No murmur heard. Pulmonary/Chest: Effort normal and breath sounds normal. No respiratory distress. She has no wheezes. She has no rales. She exhibits no tenderness.  Musculoskeletal: Normal range of motion. She exhibits edema (1+ pitting edema in the bilateral lower legs.).  Neurological: She is alert and oriented to person, place, and time.  Skin: Skin is warm and dry. No rash noted. She is not diaphoretic. No erythema. No pallor.  Psychiatric: She has a normal mood and affect. Her behavior is normal. Judgment and thought content normal.   Filed Vitals:   07/14/13 0852  BP: 130/86  Pulse: 76  Temp: 97.6 F (36.4 C)  Resp: 16   Lab Results  Component Value Date   WBC 7.2 11/18/2011   HGB 15.2* 11/18/2011   HCT 43.9 11/18/2011   PLT 338 11/18/2011   GLUCOSE 109* 12/16/2012   CHOL 188 11/19/2011    TRIG 203* 11/19/2011   HDL 66 11/19/2011   LDLDIRECT 113.8 06/05/2007   LDLCALC 81 11/19/2011   ALT 18 12/16/2012   AST 18 12/16/2012   NA 142 12/16/2012   K 4.6 12/16/2012   CL 106 12/16/2012   CREATININE 0.9 12/16/2012   BUN 15 12/16/2012   CO2 26 12/16/2012   TSH 0.63 08/21/2011   INR 1.05 11/18/2011   HGBA1C 6.2* 11/19/2011      Assessment & Plan:  Tippi was seen today for follow up medication for hypertension, history of TIA, and GERD.  Diagnoses and associated orders for this visit:  Unspecified essential hypertension - Stable and improving. - diltiazem (CARDIZEM CD) 240 MG 24 hr capsule; Take 1 capsule (240 mg total) by mouth daily.  GERD - Stable - pantoprazole (PROTONIX) 40 MG tablet; Take 1 tablet (40 mg total) by mouth daily. TAKE 1 TABLET EACH DAY.  Cerebrovascular accident - Stable - clopidogrel (PLAVIX) 75 MG tablet; Take 1 tablet (75 mg total) by mouth once.    Patient Instructions  You now have 5 refills each of your Diltiazem, your Clopidogrel, and your Pantoprazole. This should last you until you get to Establish with Dr. Yong Channel.  Continue exercising and dieting as you have been doing. You have had wonderful success so far and both your weight and blood pressure reflect this.  Followup to clinic as needed.

## 2013-10-13 ENCOUNTER — Ambulatory Visit: Payer: Medicare Other | Admitting: Neurology

## 2013-11-11 ENCOUNTER — Encounter: Payer: Self-pay | Admitting: Neurology

## 2013-11-11 ENCOUNTER — Ambulatory Visit (INDEPENDENT_AMBULATORY_CARE_PROVIDER_SITE_OTHER): Payer: Medicare Other | Admitting: Neurology

## 2013-11-11 ENCOUNTER — Ambulatory Visit (INDEPENDENT_AMBULATORY_CARE_PROVIDER_SITE_OTHER): Payer: Medicare Other

## 2013-11-11 VITALS — BP 158/73 | HR 60 | Temp 97.5°F | Ht 65.0 in | Wt 156.5 lb

## 2013-11-11 DIAGNOSIS — R0989 Other specified symptoms and signs involving the circulatory and respiratory systems: Secondary | ICD-10-CM

## 2013-11-11 DIAGNOSIS — I6350 Cerebral infarction due to unspecified occlusion or stenosis of unspecified cerebral artery: Secondary | ICD-10-CM

## 2013-11-11 NOTE — Patient Instructions (Addendum)
I had a long discussion with the patient with regards to her risk for recurrent strokes and secondary stroke prevention strategies. She was advised  to continue Plavix and maintain strict control of hypertension with blood pressure goal below 130/90. She was encouraged to eat healthy and lose weight. Check carotid ultrasound for right carotid bruit. Return for followup in 6 months  With Charlott Holler, NP or call earlier if necessary.

## 2013-11-11 NOTE — Progress Notes (Signed)
[facility] [address] [place]. [place] [address] [phone]       OFFICE FOLLOW-UP NOTE  [name] Date of Birth:  02/11/1923 Medical Record Number:  [id number]   HPI:  80 year old patient with right frontal embolic infarct in September 2013 without definite identified source of embolism. 10/15/2012 he returns for followup after last visit on 04/15/12. She continues to do well without recurrent stroke or TIA symptoms. He is tolerating Plavix well  without significant bleeding, bruising or other side effects. He states his blood pressure is quite well controlled though it is slightly elevated at 154/71 in the office today. He has known new neurological symptoms today. UPDATE 11/11/2013 : She returns for followup after last visit 1 year ago. She continues to do well from neurovascular standpoint without recurrent stroke or TIA symptoms. She is tolerating Plavix well without significant bleeding and only minor bruising. She has brought along her blood pressure log which shows that her blood pressure at home he usually ranges between 0:24 to 097 systolic but today it is elevated in our office at 153/73. She is eating healthy and exercising and has in fact lost some weight. She had carotid ultrasound done in our office after the last visit on 10/27/12 which showed no significant extracranial stenosis. She has been in good health and has not had any new problems. She has no complaints today. ROS:   14 system review of systems is positive for no complaints. All systems negative PMH:  Past Medical History  Diagnosis Date  . Adenomatous polyps   . Pyloric stenosis   . History of UTI   . Edema   . Effusion of ankle and foot joint   . Special screening for malignant neoplasm of colon   . Observation for suspected cardiovascular disease   . Other abnormal blood chemistry   . Cerebrovascular accident   . S/P total abdominal hysterectomy   . Hypertension   . Peptic ulcer disease    . GERD (gastroesophageal reflux disease)   . Cancer 1985    Cervical Ca-no radiation or chemo    Social History:  History   Social History  . Marital Status: Widowed    Spouse Name: N/A    Number of Children: 0  . Years of Education: College   Occupational History  . Retired    Social History Main Topics  . Smoking status: Former Smoker    Quit date: 03/11/1948  . Smokeless tobacco: Never Used  . Alcohol Use: Yes     Comment: Occasional wine, not very often  . Drug Use: No  . Sexual Activity: No   Other Topics Concern  . Not on file   Social History Narrative   HSG, [employer]. Married '50- widowed after 51 years.Lives alone in own home. On the list [facility]   ACP/End of life: No CPR, no heroic/futile treatment, i.e. prolonged ICU care. HCPA sister [name] tele [phone].          Medications:   Current Outpatient Prescriptions on File Prior to Visit  Medication Sig Dispense Refill  . clopidogrel (PLAVIX) 75 MG tablet Take 1 tablet (75 mg total) by mouth once.  30 tablet  5  . diltiazem (CARDIZEM CD) 240 MG 24 hr capsule Take 1 capsule (240 mg total) by mouth daily.  30 capsule  5  . furosemide (LASIX) 20 MG tablet Take 1 tablet (20 mg total) by mouth daily.  30 tablet  11  . Multiple  Vitamins-Minerals (CENTRUM SILVER) tablet Take 1 tablet by mouth daily.        . naproxen sodium (ANAPROX) 220 MG tablet Take 220 mg by mouth every 6 (six) hours as needed.      . pantoprazole (PROTONIX) 40 MG tablet Take 1 tablet (40 mg total) by mouth daily. TAKE 1 TABLET EACH DAY.  30 tablet  5   No current facility-administered medications on file prior to visit.    Allergies:  No Known Allergies  Physical Exam General: well developed, well nourished, seated, in no evident distress Head: head normocephalic and atraumatic. Orohparynx benign Neck: supple with no carotid or supraclavicular bruits Cardiovascular: regular rate and rhythm, no murmurs Musculoskeletal: no  deformity Skin:  no rash/petichiae Vascular:  Normal pulses all extremities Filed Vitals:   11/11/13 1050  BP: 158/73  Pulse: 60  Temp: 97.5 F (36.4 C)    Neurologic Exam Mental Status: Awake and fully alert. Oriented to place and time. Recent and remote memory intact. Attention span, concentration and fund of knowledge appropriate. Mood and affect appropriate.  Cranial Nerves: Fundoscopic exam not done.  Pupils equal, briskly reactive to light. Extraocular movements full without nystagmus. Visual fields full to confrontation. Hearing intact. Facial sensation intact. Face, tongue, palate moves normally and symmetrically.  Motor: Normal bulk and tone. Normal strength in all tested extremity muscles. Diminished fine finger movements on the right. Sensory.: intact to tough and pinprick and vibratory.  Coordination: Rapid alternating movements normal in all extremities. Finger-to-nose and heel-to-shin performed accurately bilaterally. Gait and Station: Arises from chair without difficulty. Stance is normal. Gait demonstrates normal stride length and balance . Able to heel, toe and tandem walk without difficulty.  Reflexes: 1+ and symmetric. Toes downgoing.      ASSESSMENT: 80 year old patient with right frontal embolic infarct in September 2013 without definite identified source of embolism.    PLAN: I had a long discussion with the patient with regards to her risk for recurrent strokes and secondary stroke prevention strategies. Continue Plavix for stroke prevention and strict control of hypertension with blood pressure goal below 130/90. Check followup carotid ultrasound study. Return for followup in 6 months with Charlott Holler, NP or call earlier if necessary.

## 2013-11-25 ENCOUNTER — Ambulatory Visit: Payer: Medicare Other | Admitting: Neurology

## 2013-11-26 ENCOUNTER — Telehealth: Payer: Self-pay | Admitting: *Deleted

## 2013-11-26 NOTE — Telephone Encounter (Signed)
Called the patient lvm letting her know her test results was normal.. Also i will mail results to patient.

## 2014-01-20 ENCOUNTER — Telehealth: Payer: Self-pay | Admitting: Family Medicine

## 2014-01-20 DIAGNOSIS — K219 Gastro-esophageal reflux disease without esophagitis: Secondary | ICD-10-CM

## 2014-01-20 MED ORDER — PANTOPRAZOLE SODIUM 40 MG PO TBEC: 40.0000 mg | DELAYED_RELEASE_TABLET | Freq: Every day | ORAL | Status: DC

## 2014-01-20 NOTE — Telephone Encounter (Signed)
Medication refilled

## 2014-01-20 NOTE — Telephone Encounter (Signed)
[facility], [address] RD. Is requesting re-fill on pantoprazole (PROTONIX) 40 MG tablet

## 2014-01-24 ENCOUNTER — Telehealth: Payer: Self-pay | Admitting: Family Medicine

## 2014-01-24 DIAGNOSIS — I639 Cerebral infarction, unspecified: Secondary | ICD-10-CM

## 2014-01-24 MED ORDER — CLOPIDOGREL BISULFATE 75 MG PO TABS: 75.0000 mg | ORAL_TABLET | Freq: Once | ORAL | Status: DC

## 2014-01-24 NOTE — Telephone Encounter (Signed)
[facility], [address] RD. Is requesting re-fill on clopidogrel (PLAVIX) 75 MG tablet

## 2014-01-24 NOTE — Telephone Encounter (Signed)
Medication refilled

## 2014-01-27 ENCOUNTER — Encounter: Payer: Self-pay | Admitting: Neurology

## 2014-02-17 ENCOUNTER — Other Ambulatory Visit: Payer: Self-pay

## 2014-02-17 DIAGNOSIS — K219 Gastro-esophageal reflux disease without esophagitis: Secondary | ICD-10-CM

## 2014-02-17 MED ORDER — PANTOPRAZOLE SODIUM 40 MG PO TBEC: 40.0000 mg | DELAYED_RELEASE_TABLET | Freq: Every day | ORAL | Status: DC

## 2014-02-17 NOTE — Telephone Encounter (Signed)
Rx request for protonix dr 40 mg tablet- Take 1 tablet daily #30.   Rx request sent to pharmacy. Pt has upcoming appt with Dr. Yong Channel in 2016.

## 2014-02-22 ENCOUNTER — Other Ambulatory Visit: Payer: Self-pay | Admitting: *Deleted

## 2014-02-22 DIAGNOSIS — I10 Essential (primary) hypertension: Secondary | ICD-10-CM

## 2014-02-22 MED ORDER — DILTIAZEM HCL ER COATED BEADS 240 MG PO CP24: 240.0000 mg | ORAL_CAPSULE | Freq: Every day | ORAL | Status: DC

## 2014-03-02 ENCOUNTER — Ambulatory Visit: Payer: Medicare Other | Admitting: Family Medicine

## 2014-03-14 ENCOUNTER — Telehealth: Payer: Self-pay | Admitting: Family Medicine

## 2014-03-14 DIAGNOSIS — I639 Cerebral infarction, unspecified: Secondary | ICD-10-CM

## 2014-03-14 DIAGNOSIS — I10 Essential (primary) hypertension: Secondary | ICD-10-CM

## 2014-03-14 MED ORDER — CLOPIDOGREL BISULFATE 75 MG PO TABS: 75.0000 mg | ORAL_TABLET | Freq: Once | ORAL | Status: DC

## 2014-03-14 MED ORDER — DILTIAZEM HCL ER COATED BEADS 240 MG PO CP24: 240.0000 mg | ORAL_CAPSULE | Freq: Every day | ORAL | Status: DC

## 2014-03-14 MED ORDER — FUROSEMIDE 20 MG PO TABS: 20.0000 mg | ORAL_TABLET | Freq: Every day | ORAL | Status: DC

## 2014-03-14 NOTE — Telephone Encounter (Signed)
Medications refilled

## 2014-03-14 NOTE — Telephone Encounter (Signed)
Pt has appt in April 2016. Pt needs refills on clopidogrel, furosemide and diltiazem call into [facility]

## 2014-05-11 ENCOUNTER — Ambulatory Visit: Payer: Medicare Other | Admitting: Nurse Practitioner

## 2014-05-11 ENCOUNTER — Ambulatory Visit (INDEPENDENT_AMBULATORY_CARE_PROVIDER_SITE_OTHER): Payer: Medicare Other | Admitting: Adult Health

## 2014-05-11 ENCOUNTER — Encounter: Payer: Self-pay | Admitting: Adult Health

## 2014-05-11 VITALS — BP 157/73 | HR 66 | Ht 65.0 in | Wt 155.0 lb

## 2014-05-11 DIAGNOSIS — Z8673 Personal history of transient ischemic attack (TIA), and cerebral infarction without residual deficits: Secondary | ICD-10-CM | POA: Diagnosis not present

## 2014-05-11 DIAGNOSIS — I10 Essential (primary) hypertension: Secondary | ICD-10-CM

## 2014-05-11 DIAGNOSIS — I639 Cerebral infarction, unspecified: Secondary | ICD-10-CM | POA: Diagnosis not present

## 2014-05-11 NOTE — Progress Notes (Signed)
PATIENT: [name] DOB: [birth year]  REASON FOR VISIT: follow up- history of CVA HISTORY FROM: patient  HISTORY OF PRESENT ILLNESS: Mr. Soldo is an 81 year old female with a history of right frontal embolic infarct in September 2013. She returns today for follow-up. She is currently taking Plavix and tolerating it well. Denies any significant bleeding or bruising. Patient states that her blood pressure usually runs systolic between 865-784. Today it is 157/73. Patient states that she has not had blood work completed recently. Her primary care provider retired last year she has an appointment with a new provider next month. She states at that time she will have her cholesterol checked. Patient continues to try to eat healthy and exercise. she states that she continues to play golf regularly. Denies any new neurological symptoms. No new medical issues since last seen.  HISTORY (SETHI): 81 year old patient with right frontal embolic infarct in September 2013 without definite identified source of embolism. 10/15/2012 he returns for followup after last visit on 04/15/12. She continues to do well without recurrent stroke or TIA symptoms. He is tolerating Plavix well without significant bleeding, bruising or other side effects. He states his blood pressure is quite well controlled though it is slightly elevated at 154/71 in the office today. He has known new neurological symptoms today. UPDATE 11/11/2013 : She returns for followup after last visit 1 year ago. She continues to do well from neurovascular standpoint without recurrent stroke or TIA symptoms. She is tolerating Plavix well without significant bleeding and only minor bruising. She has brought along her blood pressure log which shows that her blood pressure at home he usually ranges between 6:96 to 295 systolic but today it is elevated in our office at 153/73. She is eating healthy and exercising and has in fact lost some weight. She had carotid  ultrasound done in our office after the last visit on 10/27/12 which showed no significant extracranial stenosis. She has been in good health and has not had any new problems. She has no complaints today.9  REVIEW OF SYSTEMS: Out of a complete 14 system review of symptoms, the patient complains only of the following symptoms, and all other reviewed systems are negative.  See HPI  ALLERGIES: No Known Allergies  HOME MEDICATIONS: Outpatient Prescriptions Prior to Visit  Medication Sig Dispense Refill  . clopidogrel (PLAVIX) 75 MG tablet Take 1 tablet (75 mg total) by mouth once. 30 tablet 3  . diltiazem (CARDIZEM CD) 240 MG 24 hr capsule Take 1 capsule (240 mg total) by mouth daily. 30 capsule 3  . furosemide (LASIX) 20 MG tablet Take 1 tablet (20 mg total) by mouth daily. 30 tablet 3  . Multiple Vitamins-Minerals (CENTRUM SILVER) tablet Take 1 tablet by mouth daily.      . naproxen sodium (ANAPROX) 220 MG tablet Take 220 mg by mouth every 6 (six) hours as needed.    . pantoprazole (PROTONIX) 40 MG tablet Take 1 tablet (40 mg total) by mouth daily. TAKE 1 TABLET EACH DAY. 30 tablet 4  . polyethylene glycol (MIRALAX / GLYCOLAX) packet Take 17 g by mouth daily as needed.     No facility-administered medications prior to visit.    PAST MEDICAL HISTORY: Past Medical History  Diagnosis Date  . Adenomatous polyps   . Pyloric stenosis   . History of UTI   . Edema   . Effusion of ankle and foot joint   . Special screening for malignant neoplasm of colon   .  Observation for suspected cardiovascular disease   . Other abnormal blood chemistry   . Cerebrovascular accident   . S/P total abdominal hysterectomy   . Hypertension   . Peptic ulcer disease   . GERD (gastroesophageal reflux disease)   . Cancer 1985    Cervical Ca-no radiation or chemo    PAST SURGICAL HISTORY: Past Surgical History  Procedure Laterality Date  . Cyst on right hand removed    . Cataract extraction  2008    Dr  Kathrin Penner  . Abdominal hysterectomy    . Tee without cardioversion  11/20/2011    Procedure: TRANSESOPHAGEAL ECHOCARDIOGRAM (TEE);  Surgeon: Jolaine Artist, MD;  Location: [facility] ENDOSCOPY;  Service: Cardiovascular;  Laterality: N/A;    FAMILY HISTORY: Family History  Problem Relation Age of Onset  . Heart failure Mother   . Hypertension Mother   . Coronary artery disease Mother     PHYSICAL EXAM  Filed Vitals:   05/11/14 0920  BP: 157/73  Pulse: 66  Height: 5\' 5"  (1.651 m)  Weight: 155 lb (70.308 kg)   Body mass index is 25.79 kg/(m^2).  Generalized: Well developed, in no acute distress   Neurological examination  Mentation: Alert oriented to time, place, history taking. Follows all commands speech and language fluent Cranial nerve II-XII: Pupils were equal round reactive to light. Extraocular movements were full, visual field were full on confrontational test. Facial sensation and strength were normal. Uvula tongue midline. Head turning and shoulder shrug  were normal and symmetric. Motor: The motor testing reveals 5 over 5 strength of all 4 extremities. Good symmetric motor tone is noted throughout.  Sensory: Sensory testing is intact to soft touch on all 4 extremities. No evidence of extinction is noted.  Coordination: Cerebellar testing reveals good finger-nose-finger and heel-to-shin bilaterally.  Gait and station: Gait is normal. Tandem gait is slightly unsteady. Romberg is negative. No drift is seen.  Reflexes: Deep tendon reflexes are symmetric and normal bilaterally.  Marland Kitchen   DIAGNOSTIC DATA (LABS, IMAGING, TESTING) - I reviewed patient records, labs, notes, testing and imaging myself where available.  Ultrasound carotid Dopplers bilateral 12/08/2013 : No significant stenosis noted bilaterally.     ASSESSMENT AND PLAN 81 y.o. year old female  has a past medical history of Adenomatous polyps; Pyloric stenosis; History of UTI; Edema; Effusion of ankle and foot joint;  Special screening for malignant neoplasm of colon; Observation for suspected cardiovascular disease; Other abnormal blood chemistry; Cerebrovascular accident; S/P total abdominal hysterectomy; Hypertension; Peptic ulcer disease; GERD (gastroesophageal reflux disease); and Cancer (1985). here with:  1. History of CVA  2. Hypertension   Overall the patient is doing well. She will continue Plavix for stroke prevention and strict control of hypertension with blood pressure goal below 130/90. Cholesterol LDL goal should be less than 100. Hemoglobin A1c less than 6.5%. Patient should continue exercising and participating in a healthy diet. Carotid Dopplers showed no significant stenosis bilaterally. She was advised that if she has any stroke like symptoms to call 911 immediately. Patient verbalized understanding. She will follow-up in one year or sooner if needed.    Ward Givens, MSN, NP-C 05/11/2014, 9:43 AM [facility] [address], [place], [place] [address] [phone]  Note: This document was prepared with digital dictation and possible smart phrase technology. Any transcriptional errors that result from this process are unintentional.

## 2014-05-11 NOTE — Progress Notes (Signed)
I agree with the above plan 

## 2014-05-11 NOTE — Patient Instructions (Signed)
Continue Plavix for stroke prevention and strict control of hypertension with blood pressure goal below 130/90. Keep cholesterol LDL less than 100. Hemoglobin Alc less than 6.5 %.  If you have any stroke like symptoms please call 911 and go to the ED.

## 2014-05-30 DIAGNOSIS — Z961 Presence of intraocular lens: Secondary | ICD-10-CM | POA: Diagnosis not present

## 2014-05-30 DIAGNOSIS — H11001 Unspecified pterygium of right eye: Secondary | ICD-10-CM | POA: Diagnosis not present

## 2014-05-30 DIAGNOSIS — H3531 Nonexudative age-related macular degeneration: Secondary | ICD-10-CM | POA: Diagnosis not present

## 2014-05-30 DIAGNOSIS — H1789 Other corneal scars and opacities: Secondary | ICD-10-CM | POA: Diagnosis not present

## 2014-06-28 ENCOUNTER — Ambulatory Visit (INDEPENDENT_AMBULATORY_CARE_PROVIDER_SITE_OTHER): Payer: Medicare Other | Admitting: Family Medicine

## 2014-06-28 ENCOUNTER — Encounter: Payer: Self-pay | Admitting: Family Medicine

## 2014-06-28 VITALS — BP 140/68 | HR 77 | Temp 98.2°F | Wt 166.0 lb

## 2014-06-28 DIAGNOSIS — R739 Hyperglycemia, unspecified: Secondary | ICD-10-CM | POA: Diagnosis not present

## 2014-06-28 DIAGNOSIS — E1169 Type 2 diabetes mellitus with other specified complication: Secondary | ICD-10-CM | POA: Insufficient documentation

## 2014-06-28 DIAGNOSIS — E1122 Type 2 diabetes mellitus with diabetic chronic kidney disease: Secondary | ICD-10-CM | POA: Insufficient documentation

## 2014-06-28 DIAGNOSIS — E785 Hyperlipidemia, unspecified: Secondary | ICD-10-CM

## 2014-06-28 DIAGNOSIS — I10 Essential (primary) hypertension: Secondary | ICD-10-CM | POA: Diagnosis not present

## 2014-06-28 DIAGNOSIS — I639 Cerebral infarction, unspecified: Secondary | ICD-10-CM

## 2014-06-28 MED ORDER — HYDROCHLOROTHIAZIDE 25 MG PO TABS: 25.0000 mg | ORAL_TABLET | Freq: Every day | ORAL | Status: DC

## 2014-06-28 NOTE — Assessment & Plan Note (Addendum)
Poor control on last 3 visits ranging 782-423 systolic.Goal <130/90 due to prior CVA. At least want to regularly get systolic under 536.  Continue Diltiazem 240 XL. No history heart failure though does have some edema. Trial hctz 25mg  instead of 20mg  lasix and follow up 2 weeks.

## 2014-06-28 NOTE — Progress Notes (Signed)
[name] Reddish, MD Phone: [phone]  Subjective:  Patient presents today to establish care with me as their new primary care provider. Patient was formerly a patient of Dr. Linda Hedges. Chief complaint-noted.   Hypertension-poor control with history CVA and neuro goal 130/90  CVA- stroke 2013, follows with Dr. Leonie Man. Compliant with plavix.  Hyperlipidemia- no current rx with prior elevations at times.  BP Readings from Last 3 Encounters:  06/28/14 140/68  05/11/14 157/73  11/11/13 158/73   Home BP monitoring-no Compliant with medications-yes without side effects ROS-Denies any CP, HA, SOB, blurry vision. No headache  The following were reviewed and entered/updated in epic: Past Medical History  Diagnosis Date  . Adenomatous polyps   . Pyloric stenosis   . History of UTI   . Edema   . Effusion of ankle and foot joint   . Special screening for malignant neoplasm of colon   . Observation for suspected cardiovascular disease   . Other abnormal blood chemistry   . Cerebrovascular accident   . S/P total abdominal hysterectomy   . Hypertension   . Peptic ulcer disease   . GERD (gastroesophageal reflux disease)   . Cancer 1985    Cervical Ca-no radiation or chemo   Patient Active Problem List   Diagnosis Date Noted  . ACP (advance care planning) 12/17/2012    Priority: High  . Cerebrovascular accident     Priority: High  . Hyperlipidemia 06/28/2014    Priority: Medium  . Hyperglycemia 06/28/2014    Priority: Medium  . Essential hypertension 12/02/2006    Priority: Medium  . Routine health maintenance 08/25/2011    Priority: Low  . GERD 12/02/2006    Priority: Low   Past Surgical History  Procedure Laterality Date  . Cyst on right hand removed    . Cataract extraction  2008    Dr Kathrin Penner  . Abdominal hysterectomy      including cervix  . Tee without cardioversion  11/20/2011    TEE with bubble study-[facility]FO    Family History  Problem Relation Age of Onset  .  Heart failure Mother   . Hypertension Mother   . Coronary artery disease Mother    Medications- reviewed and updated Current Outpatient Prescriptions  Medication Sig Dispense Refill  . clopidogrel (PLAVIX) 75 MG tablet Take 1 tablet (75 mg total) by mouth once. 30 tablet 3  . diltiazem (CARDIZEM CD) 240 MG 24 hr capsule Take 1 capsule (240 mg total) by mouth daily. 30 capsule 3  . furosemide (LASIX) 20 MG tablet Take 1 tablet (20 mg total) by mouth daily. 30 tablet 3  . Multiple Vitamins-Minerals (CENTRUM SILVER) tablet Take 1 tablet by mouth daily.      . pantoprazole (PROTONIX) 40 MG tablet Take 1 tablet (40 mg total) by mouth daily. TAKE 1 TABLET EACH DAY. 30 tablet 4  . naproxen sodium (ANAPROX) 220 MG tablet Take 220 mg by mouth every 6 (six) hours as needed.    . polyethylene glycol (MIRALAX / GLYCOLAX) packet Take 17 g by mouth daily as needed.     Allergies-reviewed and updated No Known Allergies  History   Social History  . Marital Status: Widowed    Spouse Name: N/A  . Number of Children: 0  . Years of Education: College   Occupational History  . Retired    Social History Main Topics  . Smoking status: Former Smoker    Quit date: 03/11/1948  . Smokeless tobacco: Never Used  .  Alcohol Use: 0.0 oz/week    0 Standard drinks or equivalent per week     Comment: Occasional wine, not very often  . Drug Use: No  . Sexual Activity: No   Other Topics Concern  . Not on file   Social History Narrative   HSG, [employer]. Married '50- widowed after 44 years.Lives alone in own home. Does housework but not yardwork      On the list [facility]   ACP/End of life: No CPR, no heroic/futile treatment, i.e. prolonged ICU care. HCPA sister [name] tele [phone].      Patient is retired from [employer] after 35 years   Right handed.   Caffeine two cups of coffee daily. Very Rare coke.      Hobbies: play golf, play bridge       ROS--See HPI   Objective: BP 140/68  mmHg  Pulse 77  Temp(Src) 98.2 F (36.8 C)  Wt 166 lb (75.297 kg)g Gen: NAD, resting comfortably HEENT: Mucous membranes are moist. Good dentition for age CV: RRR no murmurs rubs or gallops Lungs: CTAB no crackles, wheeze, rhonchi Abdomen: soft/nontender/nondistended/normal bowel sounds. No rebound or guarding.  Ext: trace edema Skin: warm, dry, no rash Neuro: grossly normal, moves all extremities, PERRLA   Assessment/Plan:  Essential hypertension Poor control on last 3 visits ranging 366-294 systolic.Goal <130/90 due to prior CVA. At least want to regularly get systolic under 765.  Continue Diltiazem 240 XL. No history heart failure though does have some edema. Trial hctz 25mg  instead of 20mg  lasix and follow up 2 weeks.    Cerebrovascular accident Continue Plavix. Follows with Dr. Leonie Man. Last lipids showed elevated triglycerides but other #s ok kin 2013-recheck today and with CVA history consider statin      Hyperlipidemia Review of past labs show TOtal >200 at times, trig >200 at times, LDL <100. Check fasting lipids today, if elevated, consider statin.    Hyperglycemia S: admits to loving sugar. a1c 2013 6.2 A/P: check a1c today, concern may have progressed to diabetes, hopeful not    2 week follow up for BP  Check future fasting labs Hyperglycemia-a1c Hyperlipidemia-lipids, tsh HTN- CBC, CMET  Meds ordered this encounter  Medications  . hydrochlorothiazide (HYDRODIURIL) 25 MG tablet    Sig: Take 1 tablet (25 mg total) by mouth daily.    Dispense:  30 tablet    Refill:  5

## 2014-06-28 NOTE — Assessment & Plan Note (Signed)
Review of past labs show TOtal >200 at times, trig >200 at times, LDL <100. Check fasting lipids today, if elevated, consider statin.

## 2014-06-28 NOTE — Assessment & Plan Note (Signed)
S: admits to loving sugar. a1c 2013 6.2 A/P: check a1c today, concern may have progressed to diabetes, hopeful not

## 2014-06-28 NOTE — Patient Instructions (Addendum)
Let's try hydrochlorothiazide instead of lasix in the morning to see if that helps with blood pressure. Continue diltiazem. See me in 2 weeks to check in on blood pressure and any side effects of medicine.   Schedule a lab visit sometime between now and a few days before next visit, we can discuss results at visit. Nothing but water after midnight on day of labs. Looking at your old #s and history of stroke, i wonder if you may benefit from a low dose cholesterol medication.   Your blood sugar was high in past, hopeful have not developed diabetes.

## 2014-06-28 NOTE — Assessment & Plan Note (Signed)
Continue Plavix. Follows with Dr. Leonie Man. Last lipids showed elevated triglycerides but other #s ok kin 2013-recheck today and with CVA history consider statin

## 2014-06-29 ENCOUNTER — Other Ambulatory Visit (INDEPENDENT_AMBULATORY_CARE_PROVIDER_SITE_OTHER): Payer: Medicare Other

## 2014-06-29 DIAGNOSIS — I10 Essential (primary) hypertension: Secondary | ICD-10-CM | POA: Diagnosis not present

## 2014-06-29 DIAGNOSIS — R739 Hyperglycemia, unspecified: Secondary | ICD-10-CM | POA: Diagnosis not present

## 2014-06-29 DIAGNOSIS — E785 Hyperlipidemia, unspecified: Secondary | ICD-10-CM

## 2014-06-29 LAB — COMPREHENSIVE METABOLIC PANEL
ALT: 13 U/L (ref 0–35)
AST: 16 U/L (ref 0–37)
Albumin: 3.9 g/dL (ref 3.5–5.2)
Alkaline Phosphatase: 115 U/L (ref 39–117)
BUN: 18 mg/dL (ref 6–23)
CO2: 27 meq/L (ref 19–32)
Calcium: 9.1 mg/dL (ref 8.4–10.5)
Chloride: 105 mEq/L (ref 96–112)
Creatinine, Ser: 0.93 mg/dL (ref 0.40–1.20)
GFR: 60.29 mL/min (ref >60.00)
GLUCOSE: 113 mg/dL — AB (ref 70–99)
Potassium: 3.9 mEq/L (ref 3.5–5.1)
Sodium: 138 mEq/L (ref 135–145)
Total Bilirubin: 0.3 mg/dL (ref 0.2–1.2)
Total Protein: 7 g/dL (ref 6.0–8.3)

## 2014-06-29 LAB — CBC
HCT: 40.3 % (ref 36.0–46.0)
Hemoglobin: 13.7 g/dL (ref 12.0–15.0)
MCHC: 34 g/dL (ref 30.0–36.0)
MCV: 88.6 fl (ref 78.0–100.0)
PLATELETS: 330 10*3/uL (ref 150.0–400.0)
RBC: 4.54 Mil/uL (ref 3.87–5.11)
RDW: 13.3 % (ref 11.5–15.5)
WBC: 5.9 10*3/uL (ref 4.0–10.5)

## 2014-06-29 LAB — HEMOGLOBIN A1C: HEMOGLOBIN A1C: 5.9 % (ref 4.6–6.5)

## 2014-06-29 LAB — LIPID PANEL
CHOLESTEROL: 206 mg/dL — AB (ref 0–200)
HDL: 76.9 mg/dL (ref >39.00)
LDL Cholesterol: 101 mg/dL — ABNORMAL HIGH (ref 0–99)
NONHDL: 129.1
Total CHOL/HDL Ratio: 3
Triglycerides: 139 mg/dL (ref 0.0–149.0)
VLDL: 27.8 mg/dL (ref 0.0–40.0)

## 2014-06-29 LAB — TSH: TSH: 0.96 u[IU]/mL (ref 0.35–4.50)

## 2014-07-11 ENCOUNTER — Encounter: Payer: Self-pay | Admitting: Family Medicine

## 2014-07-11 ENCOUNTER — Ambulatory Visit (INDEPENDENT_AMBULATORY_CARE_PROVIDER_SITE_OTHER): Payer: Medicare Other | Admitting: Family Medicine

## 2014-07-11 VITALS — BP 122/60 | HR 74 | Temp 98.1°F | Wt 164.0 lb

## 2014-07-11 DIAGNOSIS — I10 Essential (primary) hypertension: Secondary | ICD-10-CM

## 2014-07-11 DIAGNOSIS — E785 Hyperlipidemia, unspecified: Secondary | ICD-10-CM

## 2014-07-11 NOTE — Assessment & Plan Note (Signed)
Discussed LDL near goal of 100 at 101, we will work on being more active and follow up in 1 year per patient request. If had recurrent CVA, would certainly place on statin or if cannot reach LDL goal.

## 2014-07-11 NOTE — Patient Instructions (Addendum)
Things look great. Blood pressure is controlled. Swelling has not increased. See me if swelling does increase or if you have any other questions or concerns.   See me in 6-12 months

## 2014-07-11 NOTE — Progress Notes (Signed)
Garret Reddish, MD  Subjective:  [name] is a 81 y.o. year old very pleasant female patient who presents with:  Hypertension-controlled with change to hctz  BP Readings from Last 3 Encounters:  07/11/14 122/60  06/28/14 140/68  05/11/14 157/73   Home BP monitoring-no Compliant with medications-yes without side effects ROS-Denies any CP, HA, SOB, blurry vision, LE edema.   Hyperlipidemia-mild poor control  Lab Results  Component Value Date   LDLCALC 101* 06/29/2014   On statin: no Regular exercise: no Diet: reasonable ROS- no chest pain or shortness of breath. No myalgias  Past Medical History- history CVA follows with Dr. Leonie Man, hyperglycemia with a1c 5.9.   Medications- reviewed and updated Current Outpatient Prescriptions  Medication Sig Dispense Refill  . clopidogrel (PLAVIX) 75 MG tablet Take 1 tablet (75 mg total) by mouth once. 30 tablet 3  . diltiazem (CARDIZEM CD) 240 MG 24 hr capsule Take 1 capsule (240 mg total) by mouth daily. 30 capsule 3  . hydrochlorothiazide (HYDRODIURIL) 25 MG tablet Take 1 tablet (25 mg total) by mouth daily. 30 tablet 5  . Multiple Vitamins-Minerals (CENTRUM SILVER) tablet Take 1 tablet by mouth daily.      . naproxen sodium (ANAPROX) 220 MG tablet Take 220 mg by mouth every 6 (six) hours as needed.    . pantoprazole (PROTONIX) 40 MG tablet Take 1 tablet (40 mg total) by mouth daily. TAKE 1 TABLET EACH DAY. 30 tablet 4  . polyethylene glycol (MIRALAX / GLYCOLAX) packet Take 17 g by mouth daily as needed.     No current facility-administered medications for this visit.   Objective: BP 122/60 mmHg  Pulse 74  Temp(Src) 98.1 F (36.7 C)  Wt 164 lb (74.39 kg) Gen: NAD, resting comfortably CV: RRR no murmurs rubs or gallops Lungs: CTAB no crackles, wheeze, rhonchi Abdomen: soft/nontender/nondistended/normal bowel sounds.  Ext: trace unchanged edema Skin: warm, dry, no rash  Neuro: grossly normal, moves all  extremities  Assessment/Plan:  Essential hypertension Excellent control on Diltiazem 240 XL, hctz 25mg . Changed from lasix to hctz last visit and doing well.    Hyperlipidemia Discussed LDL near goal of 100 at 101, we will work on being more active and follow up in 1 year per patient request. If had recurrent CVA, would certainly place on statin or if cannot reach LDL goal.    6-12 month follow up. Advised 6 month, patient prefers 12 month.

## 2014-07-11 NOTE — Assessment & Plan Note (Signed)
Excellent control on Diltiazem 240 XL, hctz 25mg . Changed from lasix to hctz last visit and doing well.

## 2014-07-27 ENCOUNTER — Telehealth: Payer: Self-pay

## 2014-07-27 DIAGNOSIS — K219 Gastro-esophageal reflux disease without esophagitis: Secondary | ICD-10-CM

## 2014-07-27 NOTE — Telephone Encounter (Signed)
[place], [address]: pantoprazole (PROTONIX) 40 MG tablet  Pharm[facility] name is listed as [facility]

## 2014-07-28 MED ORDER — PANTOPRAZOLE SODIUM 40 MG PO TBEC: 40.0000 mg | DELAYED_RELEASE_TABLET | Freq: Every day | ORAL | Status: DC

## 2014-07-28 NOTE — Telephone Encounter (Signed)
Medication refilled

## 2014-07-29 ENCOUNTER — Other Ambulatory Visit: Payer: Self-pay | Admitting: Family Medicine

## 2014-10-28 ENCOUNTER — Encounter: Payer: Self-pay | Admitting: Podiatry

## 2014-10-28 ENCOUNTER — Ambulatory Visit (INDEPENDENT_AMBULATORY_CARE_PROVIDER_SITE_OTHER): Payer: Medicare Other | Admitting: Podiatry

## 2014-10-28 ENCOUNTER — Ambulatory Visit (INDEPENDENT_AMBULATORY_CARE_PROVIDER_SITE_OTHER): Payer: Medicare Other

## 2014-10-28 VITALS — BP 141/67 | HR 67 | Resp 16

## 2014-10-28 DIAGNOSIS — I639 Cerebral infarction, unspecified: Secondary | ICD-10-CM

## 2014-10-28 DIAGNOSIS — L84 Corns and callosities: Secondary | ICD-10-CM | POA: Diagnosis not present

## 2014-10-28 DIAGNOSIS — M898X9 Other specified disorders of bone, unspecified site: Secondary | ICD-10-CM

## 2014-10-28 NOTE — Progress Notes (Signed)
   Subjective:    Patient ID: [name], female    DOB: 10/07/1922, 81 y.o.   MRN: [id number]  HPI Comments: "I have a problem with the toe"  Patient c/o tender, callused area plantar/medial 1st toe right for about 3 weeks. She developed a blister while playing golf and then it drained and then callused over. Tried vaseline and soaks, but no help.The toenail is thickened as well.  Toe Pain       Review of Systems  Cardiovascular: Positive for leg swelling.  Musculoskeletal: Positive for arthralgias.  All other systems reviewed and are negative.      Objective:   Physical Exam        Assessment & Plan:

## 2014-10-30 NOTE — Progress Notes (Signed)
Subjective:     Patient ID: [name], female   DOB: [birth year], 81 y.o.   MRN: [id number]  HPI patient is concerned about a plantar callus on the right foot that's been bothering her a lot especially when she tries to play golf. She is an active lady and has tried to modify shoe gear and wear padding without relief   Review of Systems  All other systems reviewed and are negative.      Objective:   Physical Exam  Constitutional: She is oriented to person, place, and time.  Cardiovascular: Intact distal pulses.   Musculoskeletal: Normal range of motion.  Neurological: She is oriented to person, place, and time.  Skin: Skin is warm.  Nursing note and vitals reviewed.  neurovascular status intact muscle strength adequate with range of motion within normal limits. Patient's found have good digital perfusion is well oriented 3 and is noted to have a plantar medial keratotic lesion on the right hallux with no drainage and no fissuring noted. Patient has good digital perfusion and is well oriented 3     Assessment:     Lesion secondary to pressure with no indications of current infection or fissure    Plan:     H&P and condition reviewed with patient. Today I debrided the lesion applied padding to take pressure off it and explained that this may be a recurrent problem. Reappoint as needed

## 2014-12-27 ENCOUNTER — Other Ambulatory Visit: Payer: Self-pay | Admitting: Family Medicine

## 2014-12-30 ENCOUNTER — Encounter: Payer: Self-pay | Admitting: Adult Health

## 2014-12-30 ENCOUNTER — Ambulatory Visit (INDEPENDENT_AMBULATORY_CARE_PROVIDER_SITE_OTHER): Payer: Medicare Other | Admitting: Adult Health

## 2014-12-30 VITALS — BP 138/70 | HR 94 | Temp 98.4°F | Ht 65.0 in | Wt 169.6 lb

## 2014-12-30 DIAGNOSIS — I639 Cerebral infarction, unspecified: Secondary | ICD-10-CM | POA: Diagnosis not present

## 2014-12-30 DIAGNOSIS — J0110 Acute frontal sinusitis, unspecified: Secondary | ICD-10-CM | POA: Diagnosis not present

## 2014-12-30 MED ORDER — DOXYCYCLINE HYCLATE 100 MG PO CAPS: 100.0000 mg | ORAL_CAPSULE | Freq: Two times a day (BID) | ORAL | Status: DC

## 2014-12-30 NOTE — Progress Notes (Signed)
Subjective:    Patient ID: [name], female    DOB: [birth year], 81 y.o.   MRN: [id number]  HPI  81 year old female who presents to the office today for two weeks of dry coughs, sinus pain and pressure as well as sinus congestion. She states " when I blow my nose I get the brown mucus that it just like glue."   Denies any fevers, n/v/d. No sick contacts.   She endorses that her cough is better but she continues to have the sinus congestion and pressure.   Review of Systems  Constitutional: Negative.   HENT: Positive for congestion, rhinorrhea and sinus pressure. Negative for ear discharge, ear pain and sore throat.   Eyes: Negative.   Respiratory: Positive for cough and shortness of breath.   Cardiovascular: Negative.   Neurological: Positive for headaches. Negative for dizziness and weakness.   Past Medical History  Diagnosis Date  . Adenomatous polyps   . Pyloric stenosis   . History of UTI   . Edema   . Effusion of ankle and foot joint   . Special screening for malignant neoplasm of colon   . Observation for suspected cardiovascular disease   . Other abnormal blood chemistry   . Cerebrovascular accident ([place])   . S/P total abdominal hysterectomy   . Hypertension   . Peptic ulcer disease   . GERD (gastroesophageal reflux disease)   . Cancer [facility]) 1985    Cervical Ca-no radiation or chemo    Social History   Social History  . Marital Status: Widowed    Spouse Name: N/A  . Number of Children: 0  . Years of Education: College   Occupational History  . Retired    Social History Main Topics  . Smoking status: Former Smoker    Quit date: 03/11/1948  . Smokeless tobacco: Never Used  . Alcohol Use: 0.0 oz/week    0 Standard drinks or equivalent per week     Comment: Occasional wine, not very often  . Drug Use: No  . Sexual Activity: No   Other Topics Concern  . Not on file   Social History Narrative   HSG, [employer]. Married '50- widowed after 19 years.Lives  alone in own home. Does housework but not yardwork      On the list [facility]   ACP/End of life: No CPR, no heroic/futile treatment, i.e. prolonged ICU care. HCPA sister [name] tele [phone].      Patient is retired from [employer] after 35 years   Right handed.   Caffeine two cups of coffee daily. Very Rare coke.      Hobbies: play golf, play bridge       Past Surgical History  Procedure Laterality Date  . Cyst on right hand removed    . Cataract extraction  2008    Dr Kathrin Penner  . Abdominal hysterectomy      including cervix  . Tee without cardioversion  11/20/2011    TEE with bubble study-PFO    Family History  Problem Relation Age of Onset  . Heart failure Mother   . Hypertension Mother   . Coronary artery disease Mother     No Known Allergies  Current Outpatient Prescriptions on File Prior to Visit  Medication Sig Dispense Refill  . clopidogrel (PLAVIX) 75 MG tablet TAKE (1) TABLET DAILY. 30 tablet 5  . diltiazem (CARDIZEM CD) 240 MG 24 hr capsule TAKE 1 CAPSULE DAILY. 30 capsule 5  .  hydrochlorothiazide (HYDRODIURIL) 25 MG tablet TAKE 1 TABLET ONCE DAILY. 30 tablet 5  . Multiple Vitamins-Minerals (CENTRUM SILVER) tablet Take 1 tablet by mouth daily.      . naproxen sodium (ANAPROX) 220 MG tablet Take 220 mg by mouth every 6 (six) hours as needed.    . pantoprazole (PROTONIX) 40 MG tablet TAKE 1 TABLET EACH DAY. 30 tablet 5  . polyethylene glycol (MIRALAX / GLYCOLAX) packet Take 17 g by mouth daily as needed.     No current facility-administered medications on file prior to visit.    BP 138/70 mmHg  Pulse 94  Temp(Src) 98.4 F (36.9 C) (Oral)  Ht 5\' 5"  (1.651 m)  Wt 169 lb 9.6 oz (76.93 kg)  BMI 28.22 kg/m2  SpO2 95%       Objective:   Physical Exam  Constitutional: She is oriented to person, place, and time. She appears well-developed and well-nourished. No distress.  HENT:  Head: Normocephalic and atraumatic.  Right Ear: External ear  normal.  Left Ear: External ear normal.  Nose: Nose normal.  Mouth/Throat: Oropharynx is clear and moist. No oropharyngeal exudate.  Eyes: Conjunctivae and EOM are normal. Pupils are equal, round, and reactive to light. Right eye exhibits no discharge. Left eye exhibits no discharge. No scleral icterus.  Cardiovascular: Normal rate, regular rhythm, normal heart sounds and intact distal pulses.  Exam reveals no gallop and no friction rub.   No murmur heard. Pulmonary/Chest: Effort normal and breath sounds normal. No respiratory distress. She has no wheezes. She has no rales. She exhibits no tenderness.  Lymphadenopathy:    She has no cervical adenopathy.  Neurological: She is alert and oriented to person, place, and time.  Skin: Skin is warm and dry. No rash noted. She is not diaphoretic. No erythema. No pallor.  Psychiatric: She has a normal mood and affect. Her behavior is normal. Judgment and thought content normal.  Nursing note and vitals reviewed.      Assessment & Plan:  1. Acute frontal sinusitis, recurrence not specified - Due to time frame will treat with abx - doxycycline (VIBRAMYCIN) 100 MG capsule; Take 1 capsule (100 mg total) by mouth 2 (two) times daily.  Dispense: 14 capsule; Refill: 0 - Follow up if no improvement in the next 2-3 days - Can also use Mucinex if cough returns  - Flonase to help with sinus pressure.

## 2014-12-30 NOTE — Progress Notes (Signed)
Pre visit review using our clinic review tool, if applicable. No additional management support is needed unless otherwise documented below in the visit note. 

## 2014-12-30 NOTE — Patient Instructions (Addendum)
It was great meeting you today!  I am glad that you are starting to feel better. If you do not continue to improve over the weekend, please start the antibiotic that I have sent into the pharmacy.   The medication is Doxycycline, take twice a day for 7 days.   Continue to stay well hydrated and rest.   If you need anything, please do not hesitate to let me know.   Sinusitis, Adult Sinusitis is redness, soreness, and inflammation of the paranasal sinuses. Paranasal sinuses are air pockets within the bones of your face. They are located beneath your eyes, in the middle of your forehead, and above your eyes. In healthy paranasal sinuses, mucus is able to drain out, and air is able to circulate through them by way of your nose. However, when your paranasal sinuses are inflamed, mucus and air can become trapped. This can allow bacteria and other germs to grow and cause infection. Sinusitis can develop quickly and last only a short time (acute) or continue over a long period (chronic). Sinusitis that lasts for more than 12 weeks is considered chronic. CAUSES Causes of sinusitis include:  Allergies.  Structural abnormalities, such as displacement of the cartilage that separates your nostrils (deviated septum), which can decrease the air flow through your nose and sinuses and affect sinus drainage.  Functional abnormalities, such as when the small hairs (cilia) that line your sinuses and help remove mucus do not work properly or are not present. SIGNS AND SYMPTOMS Symptoms of acute and chronic sinusitis are the same. The primary symptoms are pain and pressure around the affected sinuses. Other symptoms include:  Upper toothache.  Earache.  Headache.  Bad breath.  Decreased sense of smell and taste.  A cough, which worsens when you are lying flat.  Fatigue.  Fever.  Thick drainage from your nose, which often is green and may contain pus (purulent).  Swelling and warmth over the  affected sinuses. DIAGNOSIS Your health care provider will perform a physical exam. During your exam, your health care provider may perform any of the following to help determine if you have acute sinusitis or chronic sinusitis:  Look in your nose for signs of abnormal growths in your nostrils (nasal polyps).  Tap over the affected sinus to check for signs of infection.  View the inside of your sinuses using an imaging device that has a light attached (endoscope). If your health care provider suspects that you have chronic sinusitis, one or more of the following tests may be recommended:  Allergy tests.  Nasal culture. A sample of mucus is taken from your nose, sent to a lab, and screened for bacteria.  Nasal cytology. A sample of mucus is taken from your nose and examined by your health care provider to determine if your sinusitis is related to an allergy. TREATMENT Most cases of acute sinusitis are related to a viral infection and will resolve on their own within 10 days. Sometimes, medicines are prescribed to help relieve symptoms of both acute and chronic sinusitis. These may include pain medicines, decongestants, nasal steroid sprays, or saline sprays. However, for sinusitis related to a bacterial infection, your health care provider will prescribe antibiotic medicines. These are medicines that will help kill the bacteria causing the infection. Rarely, sinusitis is caused by a fungal infection. In these cases, your health care provider will prescribe antifungal medicine. For some cases of chronic sinusitis, surgery is needed. Generally, these are cases in which sinusitis recurs more than  3 times per year, despite other treatments. HOME CARE INSTRUCTIONS  Drink plenty of water. Water helps thin the mucus so your sinuses can drain more easily.  Use a humidifier.  Inhale steam 3-4 times a day (for example, sit in the bathroom with the shower running).  Apply a warm, moist washcloth to  your face 3-4 times a day, or as directed by your health care provider.  Use saline nasal sprays to help moisten and clean your sinuses.  Take medicines only as directed by your health care provider.  If you were prescribed either an antibiotic or antifungal medicine, finish it all even if you start to feel better. SEEK IMMEDIATE MEDICAL CARE IF:  You have increasing pain or severe headaches.  You have nausea, vomiting, or drowsiness.  You have swelling around your face.  You have vision problems.  You have a stiff neck.  You have difficulty breathing.   This information is not intended to replace advice given to you by your health care provider. Make sure you discuss any questions you have with your health care provider.   Document Released: 02/25/2005 Document Revised: 03/18/2014 Document Reviewed: 03/12/2011 Elsevier Interactive Patient Education [employer].

## 2015-01-30 ENCOUNTER — Other Ambulatory Visit: Payer: Self-pay | Admitting: Family Medicine

## 2015-05-17 ENCOUNTER — Encounter: Payer: Self-pay | Admitting: Adult Health

## 2015-05-17 ENCOUNTER — Ambulatory Visit (INDEPENDENT_AMBULATORY_CARE_PROVIDER_SITE_OTHER): Payer: Medicare Other | Admitting: Adult Health

## 2015-05-17 VITALS — BP 136/84 | HR 74 | Resp 14 | Ht 65.0 in | Wt 168.0 lb

## 2015-05-17 DIAGNOSIS — Z8673 Personal history of transient ischemic attack (TIA), and cerebral infarction without residual deficits: Secondary | ICD-10-CM | POA: Diagnosis not present

## 2015-05-17 NOTE — Patient Instructions (Signed)
Continue Plavix Blood pressure goal less than 130/90 Cholesterol LDL less than 100 Hemoglobin A1c less than 6.5% If you have any strokelike symptoms, 911 immediately If your symptoms worsen or you develop new symptoms please let us know.

## 2015-05-17 NOTE — Progress Notes (Signed)
PATIENT: [name] DOB: 1923-01-01  REASON FOR VISIT: follow up- stroke HISTORY FROM: patient  HISTORY OF PRESENT ILLNESS: 05/17/15: [name] is a 82 year old female with a history of right frontal embolic infarct in 0000000. She returns today for an evaluation. She is currently on Plavix and tolerating it well. She denies any significant bruising or bleeding. The patient's primary care manages her hypertension, cholesterol and diabetes. She reports that her recent blood work was unremarkable. Her blood pressure is in normal range today. The patient continues to be very active. She states that she either plays golf or cards regularly. She denies any new neurological symptoms. She returns today for an evaluation.   Update 05/11/14:  [name] is an 82 year old female with a history of right frontal embolic infarct in September 2013. She returns today for follow-up. She is currently taking Plavix and tolerating it well. Denies any significant bleeding or bruising. Patient states that her blood pressure usually runs systolic between [id number]. Today it is 157/73. Patient states that she has not had blood work completed recently. Her primary care provider retired last year she has an appointment with a new provider next month. She states at that time she will have her cholesterol checked. Patient continues to try to eat healthy and exercise. she states that she continues to play golf regularly. Denies any new neurological symptoms. No new medical issues since last seen.  HISTORY (SETHI):   82 year old patient with right frontal embolic infarct in September 2013 without definite identified source of embolism.She returns for followup after last visit on 04/15/12. She continues to do well without recurrent stroke or TIA symptoms. He is tolerating Plavix well without significant bleeding, bruising or other side effects. He states his blood pressure is quite well controlled though it is slightly elevated at 154/71 in  the office today. He has known new neurological symptoms today.  UPDATE 11/11/2013 : She returns for followup after last visit 1 year ago. She continues to do well from neurovascular standpoint without recurrent stroke or TIA symptoms. She is tolerating Plavix well without significant bleeding and only minor bruising. She has brought along her blood pressure log which shows that her blood pressure at home he usually ranges between 0000000 to [id number] systolic but today it is elevated in our office at 153/73. She is eating healthy and exercising and has in fact lost some weight. She had carotid ultrasound done in our office after the last visit on 10/27/12 which showed no significant extracranial stenosis. She has been in good health and has not had any new problems. She has no complaints today.9   REVIEW OF SYSTEMS: Out of a complete 14 system review of symptoms, the patient complains only of the following symptoms, and all other reviewed systems are negative.  Joint pain, aching muscles  ALLERGIES: No Known Allergies  HOME MEDICATIONS: Outpatient Prescriptions Prior to Visit  Medication Sig Dispense Refill  . clopidogrel (PLAVIX) 75 MG tablet TAKE (1) TABLET DAILY. 30 tablet 5  . diltiazem (CARDIZEM CD) 240 MG 24 hr capsule TAKE 1 CAPSULE DAILY. 30 capsule 5  . hydrochlorothiazide (HYDRODIURIL) 25 MG tablet TAKE 1 TABLET ONCE DAILY. 30 tablet 5  . Multiple Vitamins-Minerals (CENTRUM SILVER) tablet Take 1 tablet by mouth daily.      . pantoprazole (PROTONIX) 40 MG tablet TAKE 1 TABLET EACH DAY. 30 tablet 5  . polyethylene glycol (MIRALAX / GLYCOLAX) packet Take 17 g by mouth daily as needed.    Marland Kitchen  doxycycline (VIBRAMYCIN) 100 MG capsule Take 1 capsule (100 mg total) by mouth 2 (two) times daily. (Patient not taking: Reported on 05/17/2015) 14 capsule 0  . naproxen sodium (ANAPROX) 220 MG tablet Take 220 mg by mouth every 6 (six) hours as needed. Reported on 05/17/2015     No facility-administered medications  prior to visit.    PAST MEDICAL HISTORY: Past Medical History  Diagnosis Date  . Adenomatous polyps   . Pyloric stenosis   . History of UTI   . Edema   . Effusion of ankle and foot joint   . Special screening for malignant neoplasm of colon   . Observation for suspected cardiovascular disease   . Other abnormal blood chemistry   . Cerebrovascular accident ([place])   . S/P total abdominal hysterectomy   . Hypertension   . Peptic ulcer disease   . GERD (gastroesophageal reflux disease)   . Cancer ([place]) 1985    Cervical Ca-no radiation or chemo    PAST SURGICAL HISTORY: Past Surgical History  Procedure Laterality Date  . Cyst on right hand removed    . Cataract extraction  2008    Dr Kathrin Penner  . Abdominal hysterectomy      including cervix  . Tee without cardioversion  11/20/2011    TEE with bubble study-PFO    FAMILY HISTORY: Family History  Problem Relation Age of Onset  . Heart failure Mother   . Hypertension Mother   . Coronary artery disease Mother     SOCIAL HISTORY: Social History   Social History  . Marital Status: Widowed    Spouse Name: N/A  . Number of Children: 0  . Years of Education: College   Occupational History  . Retired    Social History Main Topics  . Smoking status: Former Smoker    Quit date: 03/11/1948  . Smokeless tobacco: Never Used  . Alcohol Use: 0.0 oz/week    0 Standard drinks or equivalent per week     Comment: Occasional wine, not very often  . Drug Use: No  . Sexual Activity: No   Other Topics Concern  . Not on file   Social History Narrative   HSG, [employer]. Married '50- widowed after 77 years.Lives alone in own home. Does housework but not yardwork      On the list [facility]   ACP/End of life: No CPR, no heroic/futile treatment, i.e. prolonged ICU care. HCPA sister [name] tele [phone].      Patient is retired from [employer] after 35 years   Right handed.   Caffeine two cups of coffee daily. Very Rare  coke.      Hobbies: play golf, play bridge         PHYSICAL EXAM  Filed Vitals:   05/17/15 0929  BP: 136/84  Pulse: 74  Resp: 14  Height: 5\' 5"  (1.651 m)  Weight: 168 lb (76.204 kg)   Body mass index is 27.96 kg/(m^2).  Generalized: Well developed, in no acute distress   Neurological examination  Mentation: Alert oriented to time, place, history taking. Follows all commands speech and language fluent Cranial nerve II-XII: Pupils were equal round reactive to light. Extraocular movements were full, visual field were full on confrontational test. Facial sensation and strength were normal. Uvula tongue midline. Head turning and shoulder shrug  were normal and symmetric. Motor: The motor testing reveals 5 over 5 strength of all 4 extremities. Good symmetric motor tone is noted throughout.  Sensory: Sensory testing  is intact to soft touch on all 4 extremities. No evidence of extinction is noted.  Coordination: Cerebellar testing reveals good finger-nose-finger and heel-to-shin bilaterally.  Gait and station: Gait is normal. Tandem gait is normal. Romberg is negative. No drift is seen.  Reflexes: Deep tendon reflexes are symmetric and normal bilaterally.   DIAGNOSTIC DATA (LABS, IMAGING, TESTING) - I reviewed patient records, labs, notes, testing and imaging myself where available.  Lab Results  Component Value Date   WBC 5.9 06/29/2014   HGB 13.7 06/29/2014   HCT 40.3 06/29/2014   MCV 88.6 06/29/2014   PLT 330.0 06/29/2014      Component Value Date/Time   NA 138 06/29/2014 0845   K 3.9 06/29/2014 0845   CL 105 06/29/2014 0845   CO2 27 06/29/2014 0845   GLUCOSE 113* 06/29/2014 0845   BUN 18 06/29/2014 0845   CREATININE 0.93 06/29/2014 0845   CALCIUM 9.1 06/29/2014 0845   PROT 7.0 06/29/2014 0845   ALBUMIN 3.9 06/29/2014 0845   AST 16 06/29/2014 0845   ALT 13 06/29/2014 0845   ALKPHOS 115 06/29/2014 0845   BILITOT 0.3 06/29/2014 0845   GFRNONAA 59* 11/18/2011 1142    GFRAA 69* 11/18/2011 1142   Lab Results  Component Value Date   CHOL 206* 06/29/2014   HDL 76.90 06/29/2014   LDLCALC 101* 06/29/2014   LDLDIRECT 113.8 06/05/2007   TRIG 139.0 06/29/2014   CHOLHDL 3 06/29/2014   Lab Results  Component Value Date   HGBA1C 5.9 06/29/2014   No results found for: VITAMINB12 Lab Results  Component Value Date   TSH 0.96 06/29/2014      ASSESSMENT AND PLAN 82 y.o. year old female  has a past medical history of Adenomatous polyps; Pyloric stenosis; History of UTI; Edema; Effusion of ankle and foot joint; Special screening for malignant neoplasm of colon; Observation for suspected cardiovascular disease; Other abnormal blood chemistry; Cerebrovascular accident [facility]); S/P total abdominal hysterectomy; Hypertension; Peptic ulcer disease; GERD (gastroesophageal reflux disease); and Cancer ([place]) (1985). here with:  1. History of CVA    Overall the patient is doing well. She will continue Plavix for stroke prevention and strict control of hypertension with blood pressure goal below 130/90. Cholesterol LDL goal should be less than 100. Hemoglobin A1c less than 6.5%.She was advised that if she has any stroke like symptoms to call 911 immediately. Patient verbalized understanding. She will follow-up in one year or sooner if needed.   I spent 50 minutes with the patient 50% of this time was spent counseling the patient on risk factors for stroke prevention.     Ward Givens, MSN, NP-C 05/17/2015, 9:37 AM [facility] [address] [place],  [address] [phone]

## 2015-05-21 NOTE — Progress Notes (Signed)
I agree with the assessment by and treatment plan from Ward Givens, NP-C  Arielle Eber A. Felecia Shelling, MD, PhD Certified in Neurology, [facility], Sleep Medicine, Pain Medicine and Neuroimaging  [facility] [address], [place], [place] [address] [phone]

## 2015-07-12 ENCOUNTER — Encounter: Payer: Self-pay | Admitting: Family Medicine

## 2015-07-12 ENCOUNTER — Ambulatory Visit (INDEPENDENT_AMBULATORY_CARE_PROVIDER_SITE_OTHER): Payer: Medicare Other | Admitting: Family Medicine

## 2015-07-12 VITALS — BP 132/82 | HR 74 | Temp 98.2°F | Ht 65.0 in | Wt 169.0 lb

## 2015-07-12 DIAGNOSIS — Z0000 Encounter for general adult medical examination without abnormal findings: Secondary | ICD-10-CM | POA: Diagnosis not present

## 2015-07-12 DIAGNOSIS — K219 Gastro-esophageal reflux disease without esophagitis: Secondary | ICD-10-CM

## 2015-07-12 DIAGNOSIS — E785 Hyperlipidemia, unspecified: Secondary | ICD-10-CM

## 2015-07-12 DIAGNOSIS — I10 Essential (primary) hypertension: Secondary | ICD-10-CM | POA: Diagnosis not present

## 2015-07-12 DIAGNOSIS — I6320 Cerebral infarction due to unspecified occlusion or stenosis of unspecified precerebral arteries: Secondary | ICD-10-CM

## 2015-07-12 LAB — LIPID PANEL
CHOL/HDL RATIO: 3
Cholesterol: 218 mg/dL — ABNORMAL HIGH (ref 0–200)
HDL: 77.2 mg/dL (ref >39.00)
LDL CALC: 109 mg/dL — AB (ref 0–99)
NonHDL: 140.64
TRIGLYCERIDES: 159 mg/dL — AB (ref 0.0–149.0)
VLDL: 31.8 mg/dL (ref 0.0–40.0)

## 2015-07-12 LAB — CBC
HEMATOCRIT: 41.3 % (ref 36.0–46.0)
Hemoglobin: 14.1 g/dL (ref 12.0–15.0)
MCHC: 34.1 g/dL (ref 30.0–36.0)
MCV: 88.7 fl (ref 78.0–100.0)
Platelets: 347 10*3/uL (ref 150.0–400.0)
RBC: 4.66 Mil/uL (ref 3.87–5.11)
RDW: 13.6 % (ref 11.5–15.5)
WBC: 9.3 10*3/uL (ref 4.0–10.5)

## 2015-07-12 LAB — COMPREHENSIVE METABOLIC PANEL
ALT: 19 U/L (ref 0–35)
AST: 21 U/L (ref 0–37)
Albumin: 4.2 g/dL (ref 3.5–5.2)
Alkaline Phosphatase: 101 U/L (ref 39–117)
BUN: 21 mg/dL (ref 6–23)
CHLORIDE: 99 meq/L (ref 96–112)
CO2: 30 meq/L (ref 19–32)
CREATININE: 1.06 mg/dL (ref 0.40–1.20)
Calcium: 9.8 mg/dL (ref 8.4–10.5)
GFR: 51.72 mL/min — ABNORMAL LOW (ref >60.00)
Glucose, Bld: 125 mg/dL — ABNORMAL HIGH (ref 70–99)
POTASSIUM: 4.3 meq/L (ref 3.5–5.1)
SODIUM: 139 meq/L (ref 135–145)
Total Bilirubin: 0.5 mg/dL (ref 0.2–1.2)
Total Protein: 7.2 g/dL (ref 6.0–8.3)

## 2015-07-12 MED ORDER — HYDROCHLOROTHIAZIDE 25 MG PO TABS: 25.0000 mg | ORAL_TABLET | Freq: Every day | ORAL | Status: DC

## 2015-07-12 MED ORDER — DILTIAZEM HCL ER COATED BEADS 240 MG PO CP24: 240.0000 mg | ORAL_CAPSULE | Freq: Every day | ORAL | Status: DC

## 2015-07-12 MED ORDER — CLOPIDOGREL BISULFATE 75 MG PO TABS: ORAL_TABLET | ORAL | Status: DC

## 2015-07-12 MED ORDER — PANTOPRAZOLE SODIUM 20 MG PO TBEC: 20.0000 mg | DELAYED_RELEASE_TABLET | Freq: Every day | ORAL | Status: DC

## 2015-07-12 NOTE — Patient Instructions (Addendum)
Refilled all medicine to [facility]. Reduced reflux medicine to 20mg - if you have worsening symptoms please let me know.   I would also like for you to sign up for an annual wellness visit on a Friday with our nurse Manuela Schwartz. This is a free benefit under medicare that may help [place] find additional ways to help you.

## 2015-07-12 NOTE — Assessment & Plan Note (Signed)
HLD- ideally LDL would be below 70 with stroke history but neurology states 100 reasonable goal- patient wants to work on diet if elevated- will update labs Lab Results  Component Value Date   CHOL 206* 06/29/2014   HDL 76.90 06/29/2014   LDLCALC 101* 06/29/2014   LDLDIRECT 113.8 06/05/2007   TRIG 139.0 06/29/2014   CHOLHDL 3 06/29/2014

## 2015-07-12 NOTE — Progress Notes (Signed)
Phone: [phone]  Subjective:  Patient presents today for their annual physical. Chief complaint-noted.   See problem oriented charting- ROS- full  review of systems was completed and negative including No chest pain or shortness of breath. Some dyspnea on exertion and stops to rest if walks 200 feet.  No headache or blurry vision.    The following were reviewed and entered/updated in epic: Past Medical History  Diagnosis Date  . Adenomatous polyps   . Pyloric stenosis   . History of UTI   . Edema   . Effusion of ankle and foot joint   . Special screening for malignant neoplasm of colon   . Observation for suspected cardiovascular disease   . Other abnormal blood chemistry   . Cerebrovascular accident ([place])   . S/P total abdominal hysterectomy   . Hypertension   . Peptic ulcer disease   . GERD (gastroesophageal reflux disease)   . Cancer [facility]) 1985    Cervical Ca-no radiation or chemo   Patient Active Problem List   Diagnosis Date Noted  . ACP (advance care planning) 12/17/2012    Priority: High  . Cerebrovascular accident [facility])     Priority: High  . Hyperlipidemia 06/28/2014    Priority: Medium  . Hyperglycemia 06/28/2014    Priority: Medium  . Essential hypertension 12/02/2006    Priority: Medium  . Routine health maintenance 08/25/2011    Priority: Low  . GERD 12/02/2006    Priority: Low   Past Surgical History  Procedure Laterality Date  . Cyst on right hand removed    . Cataract extraction  2008    Dr Kathrin Penner  . Abdominal hysterectomy      including cervix  . Tee without cardioversion  11/20/2011    TEE with bubble study-PFO    Family History  Problem Relation Age of Onset  . Heart failure Mother   . Hypertension Mother   . Coronary artery disease Mother     Medications- reviewed and updated Current Outpatient Prescriptions  Medication Sig Dispense Refill  . clopidogrel (PLAVIX) 75 MG tablet TAKE (1) TABLET DAILY. 30 tablet 5  .  diltiazem (CARDIZEM CD) 240 MG 24 hr capsule TAKE 1 CAPSULE DAILY. 30 capsule 5  . hydrochlorothiazide (HYDRODIURIL) 25 MG tablet TAKE 1 TABLET ONCE DAILY. 30 tablet 5  . Multiple Vitamins-Minerals (CENTRUM SILVER) tablet Take 1 tablet by mouth daily.      . pantoprazole (PROTONIX) 40 MG tablet TAKE 1 TABLET EACH DAY. 30 tablet 5  . polyethylene glycol (MIRALAX / GLYCOLAX) packet Take 17 g by mouth daily as needed.     No current facility-administered medications for this visit.    Allergies-reviewed and updated No Known Allergies  Social History   Social History  . Marital Status: Widowed    Spouse Name: N/A  . Number of Children: 0  . Years of Education: College   Occupational History  . Retired    Social History Main Topics  . Smoking status: Former Smoker    Quit date: 03/11/1948  . Smokeless tobacco: Never Used  . Alcohol Use: 0.0 oz/week    0 Standard drinks or equivalent per week     Comment: Occasional wine, not very often  . Drug Use: No  . Sexual Activity: No   Other Topics Concern  . None   Social History Narrative   HSG, [employer]. Married '50- widowed after 34 years.Lives alone in own home. Does housework but not yardwork  On the list [facility]   ACP/End of life: No CPR, no heroic/futile treatment, i.e. prolonged ICU care. HCPA sister [name] tele [phone].      Patient is retired from [employer] after 35 years   Right handed.   Caffeine two cups of coffee daily. Very Rare coke.      Hobbies: play golf, play bridge      Objective: BP 132/82 mmHg  Pulse 74  Temp(Src) 98.2 F (36.8 C)  Ht 5\' 5"  (1.651 m)  Wt 169 lb (76.658 kg)  BMI 28.12 kg/m2 Gen: NAD, resting comfortably HEENT: Mucous membranes are moist. Oropharynx normal Neck: no thyromegaly CV: RRR no murmurs rubs or gallops Lungs: CTAB no crackles, wheeze, rhonchi Abdomen: soft/nontender/nondistended/normal bowel sounds. No rebound or guarding.  Ext: no edema Skin: warm,  dry Neuro: grossly normal, moves all extremities, PERRLA  Assessment/Plan:  82 y.o. female presenting for annual physical.  Health Maintenance counseling: 1. Anticipatory guidance: Patient counseled regarding regular dental exams, eye exams, wearing seatbelts.  2. Risk factor reduction:  Advised patient of need for regular exercise and diet rich and fruits and vegetables to reduce risk of heart attack and stroke. Still playing golf. Weight maintained 3. Immunizations/screenings/ancillary studies- has declined all immunizations except shingles and Tdap (flu and pneumonia declined). Declined DEXA as well  4. Cervical cancer screening- aged out of screening criteria 5. Breast cancer screening- aged out of screening criteria 6. Colon cancer screening - aged out of screening criteria  Hyperglycemia- a1c 6.2 in 2013 so actually improved from prior Lab Results  Component Value Date   HGBA1C 5.9 06/29/2014   Ganglion cyst on wrist- reassured patient for right wrist with 3 small lesions.    GERD on protonix 40mg  trial lower dose at 20mg  as well controlled   Hyperlipidemia HLD- ideally LDL would be below 70 with stroke history but neurology states 100 reasonable goal- patient wants to work on diet if elevated- will update labs Lab Results  Component Value Date   CHOL 206* 06/29/2014   HDL 76.90 06/29/2014   LDLCALC 101* 06/29/2014   LDLDIRECT 113.8 06/05/2007   TRIG 139.0 06/29/2014   CHOLHDL 3 06/29/2014     Essential hypertension HTN- ideally <130 SBP but already gets some orthostatic symptoms so do not think we can push blood pressure lower. Remain on Diltiazem 240 XL, hctz 25mg   Cerebrovascular accident History of stroke on plavix and followed by Dr. Leonie Man . No residual effects from 2013 stroke    Return in about 1 year (around 07/11/2016) for physical. Return precautions advised.   Orders Placed This Encounter  Procedures  . CBC    [place]  . Comprehensive metabolic  panel    [place]    Order Specific Question:  Has the patient fasted?    Answer:  No  . Lipid panel    [place]    Order Specific Question:  Has the patient fasted?    Answer:  No    Meds ordered this encounter  Medications  . clopidogrel (PLAVIX) 75 MG tablet    Sig: TAKE (1) TABLET DAILY.    Dispense:  90 tablet    Refill:  3  . diltiazem (CARDIZEM CD) 240 MG 24 hr capsule    Sig: Take 1 capsule (240 mg total) by mouth daily.    Dispense:  90 capsule    Refill:  3  . hydrochlorothiazide (HYDRODIURIL) 25 MG tablet    Sig: Take 1 tablet (25 mg total) by mouth  daily.    Dispense:  90 tablet    Refill:  3  . pantoprazole (PROTONIX) 20 MG tablet    Sig: Take 1 tablet (20 mg total) by mouth daily.    Dispense:  90 tablet    Refill:  3    Garret Reddish, MD

## 2015-07-12 NOTE — Assessment & Plan Note (Signed)
History of stroke on plavix and followed by Dr. Leonie Man . No residual effects from 2013 stroke

## 2015-07-12 NOTE — Assessment & Plan Note (Signed)
HTN- ideally <130 SBP but already gets some orthostatic symptoms so do not think we can push blood pressure lower. Remain on Diltiazem 240 XL, hctz 25mg 

## 2015-07-12 NOTE — Assessment & Plan Note (Signed)
on protonix 40mg  trial lower dose at 20mg  as well controlled

## 2015-07-13 ENCOUNTER — Other Ambulatory Visit: Payer: Self-pay | Admitting: Family Medicine

## 2015-07-13 DIAGNOSIS — E785 Hyperlipidemia, unspecified: Secondary | ICD-10-CM

## 2015-07-13 DIAGNOSIS — R739 Hyperglycemia, unspecified: Secondary | ICD-10-CM

## 2016-01-12 ENCOUNTER — Ambulatory Visit (INDEPENDENT_AMBULATORY_CARE_PROVIDER_SITE_OTHER): Payer: Medicare Other | Admitting: Family Medicine

## 2016-01-12 ENCOUNTER — Encounter: Payer: Self-pay | Admitting: Family Medicine

## 2016-01-12 VITALS — BP 160/70 | HR 81 | Temp 98.3°F | Wt 169.0 lb

## 2016-01-12 DIAGNOSIS — K625 Hemorrhage of anus and rectum: Secondary | ICD-10-CM | POA: Diagnosis not present

## 2016-01-12 DIAGNOSIS — I10 Essential (primary) hypertension: Secondary | ICD-10-CM

## 2016-01-12 LAB — BASIC METABOLIC PANEL
BUN: 26 mg/dL — ABNORMAL HIGH (ref 6–23)
CO2: 26 meq/L (ref 19–32)
Calcium: 9.3 mg/dL (ref 8.4–10.5)
Chloride: 104 mEq/L (ref 96–112)
Creatinine, Ser: 1.12 mg/dL (ref 0.40–1.20)
GFR: 48.48 mL/min — ABNORMAL LOW (ref >60.00)
GLUCOSE: 129 mg/dL — AB (ref 70–99)
POTASSIUM: 3.7 meq/L (ref 3.5–5.1)
SODIUM: 140 meq/L (ref 135–145)

## 2016-01-12 LAB — CBC
HEMATOCRIT: 40.7 % (ref 36.0–46.0)
Hemoglobin: 13.7 g/dL (ref 12.0–15.0)
MCHC: 33.7 g/dL (ref 30.0–36.0)
MCV: 89.7 fl (ref 78.0–100.0)
Platelets: 366 10*3/uL (ref 150.0–400.0)
RBC: 4.53 Mil/uL (ref 3.87–5.11)
RDW: 13.1 % (ref 11.5–15.5)
WBC: 7.5 10*3/uL (ref 4.0–10.5)

## 2016-01-12 NOTE — Progress Notes (Signed)
Subjective:  [name] is a 82 y.o. year old very pleasant female patient who presents for/with See problem oriented charting ROS- no chest pain, fatigue, shortness of breath, mucus membrane pallor.see any ROS included in HPI as well.   Past Medical History-  Patient Active Problem List   Diagnosis Date Noted  . ACP (advance care planning) 12/17/2012    Priority: High  . Cerebrovascular accident [facility])     Priority: High  . Hyperlipidemia 06/28/2014    Priority: Medium  . Hyperglycemia 06/28/2014    Priority: Medium  . Essential hypertension 12/02/2006    Priority: Medium  . Routine health maintenance 08/25/2011    Priority: Low  . GERD 12/02/2006    Priority: Low    Medications- reviewed and updated Current Outpatient Prescriptions  Medication Sig Dispense Refill  . clopidogrel (PLAVIX) 75 MG tablet TAKE (1) TABLET DAILY. 90 tablet 3  . diltiazem (CARDIZEM CD) 240 MG 24 hr capsule Take 1 capsule (240 mg total) by mouth daily. 90 capsule 3  . hydrochlorothiazide (HYDRODIURIL) 25 MG tablet Take 1 tablet (25 mg total) by mouth daily. 90 tablet 3  . Multiple Vitamins-Minerals (CENTRUM SILVER) tablet Take 1 tablet by mouth daily.      . pantoprazole (PROTONIX) 20 MG tablet Take 1 tablet (20 mg total) by mouth daily. 90 tablet 3  . polyethylene glycol (MIRALAX / GLYCOLAX) packet Take 17 g by mouth daily as needed.     No current facility-administered medications for this visit.     Objective: BP (!) 160/70 (BP Location: Left Arm, Patient Position: Sitting, Cuff Size: Large)   Pulse 81   Temp 98.3 F (36.8 C) (Oral)   Wt 169 lb (76.7 kg)   SpO2 96%   BMI 28.12 kg/m  Gen: NAD, resting comfortably, able to get onto table without assist No mucus membrane pallor CV: RRR no murmurs rubs or gallops Lungs: CTAB no crackles, wheeze, rhonchi Abdomen: soft/nontender/nondistended/normal bowel sounds. No rebound or guarding.  Rectal: some external hemorrhoid tags, internal exam no  obvious hemorrhoids- stool with reddish tint- with prior BRBPR did not get hemocult Ext: no edema  Assessment/Plan:  Bright red blood per rectum - Plan: CBC, Basic metabolic panel, Ambulatory referral to Gastroenterology Essential hypertension S: Bowel movements once a day with gush of blood after the bowel movement. History of bleeding in 2010 but not as heavy as this. October 2010 colonoscopy with multiple tubular adenomas. Also multiple diverticula noted. Had similar bleeding episode a few years ago after her colonoscopy (only lasted a day or two). On plavix for history CVA. History of peptic ulcer on protonix- still taking. Water is so red cannot see her stool. No meds since Wednesday including blood pressure medicine.   Last month has been more constipated. Has done some straining. BP running high today but has not taken any BP meds since Wednesday.  A/P: Painless rectal bleeding. Diverticular bleeding possible. Does have some hemorrhoid tags- possible source. Ischemic colitis possible but not highest on my list. Plavix makes probability of bleeding higher.   We will hold plavix until Tuesday.[name] will schedule visit with me at Tuesday at 10 45 to decide if we can restart. Will get bloodwork today to assess any significant anemia. Refer to GI- [name] wants to know about potential underlying cause of bleeding since this is 2nd episode since last colonoscopy when she had multiple polyps. She wants to consider repeat colonoscopy even if bleeding stops- wants GI opinion, referred  but if drop in hemoglobin may need to push for sooner appointment. There is increased risk of stroke off plavix but we need to hold at this point due to GI bleeding risk. Restart all meds but plavix/clopidogrel.   Orders Placed This Encounter  Procedures  . CBC    [place]  . Basic metabolic panel    [place]  . Ambulatory referral to Gastroenterology    Referral Priority:   Routine    Referral Type:   Consultation     Referral Reason:   Specialty Services Required    Number of Visits Requested:   1   Return precautions advised.  Garret Reddish, MD

## 2016-01-12 NOTE — Progress Notes (Signed)
Pre visit review using our clinic review tool, if applicable. No additional management support is needed unless otherwise documented below in the visit note. 

## 2016-01-12 NOTE — Patient Instructions (Signed)
We will hold plavix until Tuesday.[name] will schedule visit with me at Tuesday at 10 45 to decide if we can restart. Will get bloodwork today to assess any significant anemia. Refer to GI- [name] wants to know about potential underlying cause of bleeding since this is 2nd episode since last colonoscopy when she had multiple polyps. She wants to consider repeat colonoscopy even if bleeding stops- wants GI opinion

## 2016-01-16 ENCOUNTER — Ambulatory Visit (INDEPENDENT_AMBULATORY_CARE_PROVIDER_SITE_OTHER): Payer: Medicare Other | Admitting: Family Medicine

## 2016-01-16 ENCOUNTER — Encounter: Payer: Self-pay | Admitting: Family Medicine

## 2016-01-16 VITALS — BP 124/70 | HR 72 | Temp 98.7°F | Wt 169.2 lb

## 2016-01-16 DIAGNOSIS — Z8673 Personal history of transient ischemic attack (TIA), and cerebral infarction without residual deficits: Secondary | ICD-10-CM

## 2016-01-16 DIAGNOSIS — K625 Hemorrhage of anus and rectum: Secondary | ICD-10-CM

## 2016-01-16 NOTE — Progress Notes (Signed)
Pre visit review using our clinic review tool, if applicable. No additional management support is needed unless otherwise documented below in the visit note. 

## 2016-01-16 NOTE — Patient Instructions (Addendum)
Hold off on plavix until Sunday. Let me know if any further bleeding either before or after starting plavix back- we may recheck hemoglobin  Do want to start plavix back given increased stroke risk off medicine  Call GI for appointment

## 2016-01-16 NOTE — Progress Notes (Signed)
Subjective:  [name] is a 82 y.o. year old very pleasant female patient who presents for/with See problem oriented charting ROS- no fatigue, chest pain, shortness of breath.see any ROS included in HPI as well.   Past Medical History-  Patient Active Problem List   Diagnosis Date Noted  . ACP (advance care planning) 12/17/2012    Priority: High  . Cerebrovascular accident [facility])     Priority: High  . Hyperlipidemia 06/28/2014    Priority: Medium  . Hyperglycemia 06/28/2014    Priority: Medium  . Essential hypertension 12/02/2006    Priority: Medium  . Routine health maintenance 08/25/2011    Priority: Low  . GERD 12/02/2006    Priority: Low    Medications- reviewed and updated Current Outpatient Prescriptions  Medication Sig Dispense Refill  . clopidogrel (PLAVIX) 75 MG tablet TAKE (1) TABLET DAILY. 90 tablet 3  . diltiazem (CARDIZEM CD) 240 MG 24 hr capsule Take 1 capsule (240 mg total) by mouth daily. 90 capsule 3  . hydrochlorothiazide (HYDRODIURIL) 25 MG tablet Take 1 tablet (25 mg total) by mouth daily. 90 tablet 3  . Multiple Vitamins-Minerals (CENTRUM SILVER) tablet Take 1 tablet by mouth daily.      . pantoprazole (PROTONIX) 20 MG tablet Take 1 tablet (20 mg total) by mouth daily. 90 tablet 3  . polyethylene glycol (MIRALAX / GLYCOLAX) packet Take 17 g by mouth daily as needed.     No current facility-administered medications for this visit.     Objective: BP 124/70 (BP Location: Left Arm, Patient Position: Sitting, Cuff Size: Normal)   Pulse 72   Temp 98.7 F (37.1 C) (Oral)   Wt 169 lb 3.2 oz (76.7 kg)   SpO2 96%   BMI 28.16 kg/m  Gen: NAD, resting comfortably No mucus membrane pallor CV: RRR no murmurs rubs or gallops Lungs: CTAB no crackles, wheeze, rhonchi Abdomen: soft/nontender/nondistended/normal bowel sounds. No rebound or guarding.  Ext: no edema Skin: warm, dry  Assessment/Plan:  Rectal Bleeding History CVA S: presented with painless  rectal bleeding 4 days ago. Luckily hemoglobin without significant drop- hemorroid tags notes so thought could be related to that. Referral to GI to discuss potential colonoscopy since 2nd episode since her last colonoscopy when she had several polyps.   We held plavix for 5 days (history of stroke). Her bleeding finally stopped for first time yesterday. She had no blood with BM yesterday or today.  A/P: patient with history of CVA and want to get her back on plavix quickly but will wait 5 more days in case there is a lesion healing off plavix at present. We discussed repeat CBC today but opted against with no further bleeding- if this were to occur then could come by for CBC only- if drop in HGB- that would push for sooner Gi referral. She has # but will call GI back at this time.   Return precautions advised.  Garret Reddish, MD

## 2016-01-18 ENCOUNTER — Encounter: Payer: Self-pay | Admitting: Gastroenterology

## 2016-03-22 ENCOUNTER — Encounter: Payer: Self-pay | Admitting: Gastroenterology

## 2016-03-22 ENCOUNTER — Other Ambulatory Visit (INDEPENDENT_AMBULATORY_CARE_PROVIDER_SITE_OTHER): Payer: Medicare Other

## 2016-03-22 ENCOUNTER — Ambulatory Visit (INDEPENDENT_AMBULATORY_CARE_PROVIDER_SITE_OTHER): Payer: Medicare Other | Admitting: Gastroenterology

## 2016-03-22 VITALS — BP 146/70 | HR 72 | Ht 62.21 in | Wt 167.4 lb

## 2016-03-22 DIAGNOSIS — K625 Hemorrhage of anus and rectum: Secondary | ICD-10-CM | POA: Diagnosis not present

## 2016-03-22 DIAGNOSIS — K649 Unspecified hemorrhoids: Secondary | ICD-10-CM

## 2016-03-22 DIAGNOSIS — Z86010 Personal history of colonic polyps: Secondary | ICD-10-CM

## 2016-03-22 LAB — CBC WITH DIFFERENTIAL/PLATELET
BASOS PCT: 0.3 % (ref 0.0–3.0)
Basophils Absolute: 0 10*3/uL (ref 0.0–0.1)
EOS PCT: 1.8 % (ref 0.0–5.0)
Eosinophils Absolute: 0.2 10*3/uL (ref 0.0–0.7)
HEMATOCRIT: 42 % (ref 36.0–46.0)
HEMOGLOBIN: 14.5 g/dL (ref 12.0–15.0)
LYMPHS PCT: 28.6 % (ref 12.0–46.0)
Lymphs Abs: 2.8 10*3/uL (ref 0.7–4.0)
MCHC: 34.6 g/dL (ref 30.0–36.0)
MCV: 88.2 fl (ref 78.0–100.0)
MONO ABS: 0.8 10*3/uL (ref 0.1–1.0)
MONOS PCT: 8.7 % (ref 3.0–12.0)
Neutro Abs: 5.9 10*3/uL (ref 1.4–7.7)
Neutrophils Relative %: 60.6 % (ref 43.0–77.0)
Platelets: 463 10*3/uL — ABNORMAL HIGH (ref 150.0–400.0)
RBC: 4.77 Mil/uL (ref 3.87–5.11)
RDW: 11.7 % (ref 11.5–15.5)
WBC: 9.7 10*3/uL (ref 4.0–10.5)

## 2016-03-22 NOTE — Progress Notes (Signed)
-  HPI :  83 y/o female with a history of CVA on plavix, history of cervical cancer, history of colon polyps, here for symptoms of rectal bleeding. She has not been to our office since 2010.  She reported in November she was extremely constipated and she reports she tried to disimpact herself and thinks she started the bleeding when this happened. She reported bright red blood in the stools and in the toilet for one day. She reports bleeding stopped for 2 weeks and then recurred again, similar to the other time, and then it occurred with all of her stools for a week and then stopped after she stopped plavix. She reported the bleeding was painless. She had stopped her plavix for a week and symptoms stopped. She has not had any recurrence of her symptoms since that time. During the times of her bleeding she was constipated and passing hard stools. She continues to have some hard stools, and she continues to strain. She uses dulcolax pills PRN. She has not tried a daily fiber supplement. She is now back on plavix, for history of CVA / TIAs, last 3 years ago.   Colonoscopy 12/16/08 - mild left sided diverticulosis, multiple diminutive polyps removed - TA x 3    Past Medical History:  Diagnosis Date  . Adenomatous polyps   . Cerebrovascular accident ([place])   . Cervical cancer ([place])    Cervical Ca-no radiation or chemo  . Diverticulosis   . Edema   . Effusion of ankle and foot joint   . GERD (gastroesophageal reflux disease)   . History of UTI   . Hypertension   . Observation for suspected cardiovascular disease   . Other abnormal blood chemistry   . Peptic ulcer disease   . Pyloric stenosis   . S/P total abdominal hysterectomy   . Special screening for malignant neoplasm of colon      Past Surgical History:  Procedure Laterality Date  . ABDOMINAL HYSTERECTOMY     including cervix  . CATARACT EXTRACTION  2008   Dr Kathrin Penner  . cyst on right hand removed    . TEE WITHOUT CARDIOVERSION   11/20/2011   TEE with bubble study-PFO   Family History  Problem Relation Age of Onset  . Heart failure Mother   . Hypertension Mother   . Coronary artery disease Mother    Social History  Substance Use Topics  . Smoking status: Former Smoker    Quit date: 03/11/1948  . Smokeless tobacco: Never Used  . Alcohol use 0.0 oz/week     Comment: Occasional wine, not very often   Current Outpatient Prescriptions  Medication Sig Dispense Refill  . Bisacodyl (DULCOLAX PO) Take 1 tablet by mouth as needed.    . clopidogrel (PLAVIX) 75 MG tablet TAKE (1) TABLET DAILY. 90 tablet 3  . diltiazem (CARDIZEM CD) 240 MG 24 hr capsule Take 1 capsule (240 mg total) by mouth daily. 90 capsule 3  . hydrochlorothiazide (HYDRODIURIL) 25 MG tablet Take 1 tablet (25 mg total) by mouth daily. 90 tablet 3  . Multiple Vitamins-Minerals (CENTRUM SILVER) tablet Take 1 tablet by mouth daily.      . naproxen sodium (ALEVE) 220 MG tablet Take 220 mg by mouth as needed. 3-4 times a month    . pantoprazole (PROTONIX) 20 MG tablet Take 1 tablet (20 mg total) by mouth daily. 90 tablet 3   No current facility-administered medications for this visit.    No Known Allergies  Review of Systems: All systems reviewed and negative except where noted in HPI.   Lab Results  Component Value Date   WBC 9.7 03/22/2016   HGB 14.5 03/22/2016   HCT 42.0 03/22/2016   MCV 88.2 03/22/2016   PLT 463.0 (H) 03/22/2016    Lab Results  Component Value Date   CREATININE 1.12 01/12/2016   BUN 26 (H) 01/12/2016   NA 140 01/12/2016   K 3.7 01/12/2016   CL 104 01/12/2016   CO2 26 01/12/2016    Lab Results  Component Value Date   ALT 19 07/12/2015   AST 21 07/12/2015   ALKPHOS 101 07/12/2015   BILITOT 0.5 07/12/2015     Physical Exam: BP (!) 146/70 (BP Location: Left Arm, Patient Position: Sitting, Cuff Size: Normal)   Pulse 72   Ht 5' 2.21" (1.58 m) Comment: height measured without shoes  Wt 167 lb 6 oz (75.9 kg)    BMI 30.41 kg/m  Constitutional: Pleasant,well-developed, female in no acute distress. HEENT: Normocephalic and atraumatic. Conjunctivae are normal. No scleral icterus. Neck supple.  Cardiovascular: Normal rate, regular rhythm.  Pulmonary/chest: Effort normal and breath sounds normal. No wheezing, rales or rhonchi. Abdominal: Soft, nondistended, nontender. . There are no masses palpable. No hepatomegaly. DRE / Anoscopy - no fissure, no mass lesion, normal tone, internal hemorrhoids noted on anoscopy in LL and RP position Extremities: no edema Lymphadenopathy: No cervical adenopathy noted. Neurological: Alert and oriented to person place and time. Skin: Skin is warm and dry. No rashes noted. Psychiatric: Normal mood and affect. Behavior is normal.   ASSESSMENT AND PLAN: 83 year old female on Plavix for history of CVA, presenting with rectal bleeding with history as outlined above. She has chronic constipation and straining, initial bleeding episode was precipitated by manual disimpaction. Anoscopy shows hemorrhoids without fissure. Based on history I suspect she very likely had bleeding from hemorrhoids in the setting of constipation. She has been noted to have a history of adenomatous polyps however, and bleeding polyp or mass lesion remains possible but seems less likely.   I discussed options with patient. I do recommend a daily fiber supplement to soften stools and help treat hemorrhoids, she should avoid straining if at all possible. I offered her a Flexible sigmoidoscopy or colonoscopy to ensure no other pathology, we discussed this for a bit. Following this discussion she wishes to hold off on endoscopy at this time and await her course. If her symptoms recur despite treating constipation she will notify me in and in this setting she would be willing to proceed with endoscopic evaluation which is reasonable. I checked a CBC today and she has no anemia which is reassuring.  Freedom Cellar, MD [facility] Pager [phone]  CC: Marin Olp, MD

## 2016-03-22 NOTE — Patient Instructions (Signed)
If you are age 81 or older, your body mass index should be between 23-30. Your Body mass index is 30.41 kg/m. If this is out of the aforementioned range listed, please consider follow up with your Primary Care Provider.  If you are age 81 or younger, your body mass index should be between 19-25. Your Body mass index is 30.41 kg/m. If this is out of the aformentioned range listed, please consider follow up with your Primary Care Provider.   Your physician has requested that you go to the basement for the following lab work before leaving today:  CBC  Please purchase over the counter [employer]ru[employer].  Call Dr Havery Moros if symptoms recur.  Thank you.

## 2016-03-25 NOTE — Progress Notes (Signed)
Letter mailed

## 2016-05-20 ENCOUNTER — Encounter: Payer: Self-pay | Admitting: Neurology

## 2016-05-20 ENCOUNTER — Ambulatory Visit (INDEPENDENT_AMBULATORY_CARE_PROVIDER_SITE_OTHER): Payer: Medicare Other | Admitting: Neurology

## 2016-05-20 VITALS — BP 138/76 | HR 70 | Wt 168.6 lb

## 2016-05-20 DIAGNOSIS — I6990 Unspecified sequelae of unspecified cerebrovascular disease: Secondary | ICD-10-CM | POA: Diagnosis not present

## 2016-05-20 DIAGNOSIS — I6529 Occlusion and stenosis of unspecified carotid artery: Secondary | ICD-10-CM

## 2016-05-20 NOTE — Patient Instructions (Signed)
I had a long d/w patient about her remote cryptogenic stroke, risk for recurrent stroke/TIAs, personally independently reviewed imaging studies and stroke evaluation results and answered questions.Continue Plavix for secondary stroke prevention and maintain strict control of hypertension with blood pressure goal below 130/90, diabetes with hemoglobin A1c goal below 6.5% and lipids with LDL cholesterol goal below 70 mg/dL. I also advised the patient to eat a healthy diet with plenty of whole grains, cereals, fruits and vegetables, exercise regularly and maintain ideal body weight >o scheduled followup in the future with me in the future is necessary since she has been free of stroke symptoms for 3-1/2 years. Check screening follow-up carotid ultrasound study. She otherwise to keep her scheduled follow-up appointment with primary physician and have yearly lipid profile and hemoglobin A1c checked at that visit.

## 2016-05-20 NOTE — Progress Notes (Signed)
PATIENT: [name] DOB: November 04, 1922  REASON FOR VISIT: follow up- stroke HISTORY FROM: patient  HISTORY OF PRESENT ILLNESS: 05/17/15: [name] is a 83 year old female with a history of right frontal embolic infarct in 4098. She returns today for an evaluation. She is currently on Plavix and tolerating it well. She denies any significant bruising or bleeding. The patient's primary care manages her hypertension, cholesterol and diabetes. She reports that her recent blood work was unremarkable. Her blood pressure is in normal range today. The patient continues to be very active. She states that she either plays golf or cards regularly. She denies any new neurological symptoms. She returns today for an evaluation.   Update 05/11/14:  [name] is an 83 year old female with a history of right frontal embolic infarct in September 2013. She returns today for follow-up. She is currently taking Plavix and tolerating it well. Denies any significant bleeding or bruising. Patient states that her blood pressure usually runs systolic between 119-147. Today it is 157/73. Patient states that she has not had blood work completed recently. Her primary care provider retired last year she has an appointment with a new provider next month. She states at that time she will have her cholesterol checked. Patient continues to try to eat healthy and exercise. she states that she continues to play golf regularly. Denies any new neurological symptoms. No new medical issues since last seen.  HISTORY (Tracy Kinner):   83 year old patient with right frontal embolic infarct in September 2013 without definite identified source of embolism.She returns for followup after last visit on 04/15/12. She continues to do well without recurrent stroke or TIA symptoms. He is tolerating Plavix well without significant bleeding, bruising or other side effects. He states his blood pressure is quite well controlled though it is slightly elevated at 154/71 in  the office today. He has known new neurological symptoms today.  UPDATE 11/11/2013 : She returns for followup after last visit 1 year ago. She continues to do well from neurovascular standpoint without recurrent stroke or TIA symptoms. She is tolerating Plavix well without significant bleeding and only minor bruising. She has brought along her blood pressure log which shows that her blood pressure at home he usually ranges between 8:29 to 562 systolic but today it is elevated in our office at 153/73. She is eating healthy and exercising and has in fact lost some weight. She had carotid ultrasound done in our office after the last visit on 10/27/12 which showed no significant extracranial stenosis. She has been in good health and has not had any new problems. She has no complaints today.9  Update 05/21/2016 : She returns for follow-up after last visit a year ago with nurse practitioner. She continues to do well and has not had any stroke like symptoms since 4-1/2 years since her stroke in September 2013. She did have an episode of rectal bleeding in December last year and stopped Plavix for a week. She did see a gastroenterologist who felt it was related to a polyp which had been found on colonoscopy a few years ago. She is back on Plavix and tolerating it well without any bleeding or bruising. Blood pressure is well controlled today it is borderline at 138/76. She is scheduled to see her primary physician in May for annual physical exam and plans to have lipid profile checked at that visit. She does complain of back pain and intermittent left flexure identical. She has not had carotid ultrasound done in more than  a year. She has no other recurrent stroke or TIA or neurological symptoms.  REVIEW OF SYSTEMS: Out of a complete 14 system review of symptoms, the patient complains only of the following symptoms, and all other reviewed systems are negative.  Joint pain, aching muscles, back pain, left leg sciatica,  rectal bleeding and all other systems negative  ALLERGIES: No Known Allergies  HOME MEDICATIONS: Outpatient Medications Prior to Visit  Medication Sig Dispense Refill  . Bisacodyl (DULCOLAX PO) Take 1 tablet by mouth as needed.    . clopidogrel (PLAVIX) 75 MG tablet TAKE (1) TABLET DAILY. 90 tablet 3  . diltiazem (CARDIZEM CD) 240 MG 24 hr capsule Take 1 capsule (240 mg total) by mouth daily. 90 capsule 3  . hydrochlorothiazide (HYDRODIURIL) 25 MG tablet Take 1 tablet (25 mg total) by mouth daily. 90 tablet 3  . Multiple Vitamins-Minerals (CENTRUM SILVER) tablet Take 1 tablet by mouth daily.      . pantoprazole (PROTONIX) 20 MG tablet Take 1 tablet (20 mg total) by mouth daily. 90 tablet 3  . naproxen sodium (ALEVE) 220 MG tablet Take 220 mg by mouth as needed. 3-4 times a month     No facility-administered medications prior to visit.     PAST MEDICAL HISTORY: Past Medical History:  Diagnosis Date  . Adenomatous polyps   . Cerebrovascular accident ([place])   . Cervical cancer ([place])    Cervical Ca-no radiation or chemo  . Diverticulosis   . Edema   . Effusion of ankle and foot joint   . GERD (gastroesophageal reflux disease)   . History of UTI   . Hypertension   . Observation for suspected cardiovascular disease   . Other abnormal blood chemistry   . Peptic ulcer disease   . Pyloric stenosis   . S/P total abdominal hysterectomy   . Special screening for malignant neoplasm of colon     PAST SURGICAL HISTORY: Past Surgical History:  Procedure Laterality Date  . ABDOMINAL HYSTERECTOMY     including cervix  . CATARACT EXTRACTION  2008   Dr Kathrin Penner  . cyst on right hand removed    . TEE WITHOUT CARDIOVERSION  11/20/2011   TEE with bubble study-[facility]    FAMILY HISTORY: Family History  Problem Relation Age of Onset  . Heart failure Mother   . Hypertension Mother   . Coronary artery disease Mother     SOCIAL HISTORY: Social History   Social History  . Marital  status: Widowed    Spouse name: N/A  . Number of children: 0  . Years of education: College   Occupational History  . Retired Retired   Social History Main Topics  . Smoking status: Former Smoker    Quit date: 03/11/1948  . Smokeless tobacco: Never Used  . Alcohol use 0.0 oz/week     Comment: Occasional wine, not very often  . Drug use: No  . Sexual activity: No   Other Topics Concern  . Not on file   Social History Narrative   HSG, [employer]. Married '50- widowed after 24 years.Lives alone in own home. Does housework but not yardwork      On the list [facility]   ACP/End of life: No CPR, no heroic/futile treatment, i.e. prolonged ICU care. HCPA sister [name] tele [phone].      Patient is retired from [employer] after 35 years   Right handed.   Caffeine two cups of coffee daily. Very Rare coke.  Hobbies: play golf, play bridge         PHYSICAL EXAM  Vitals:   05/20/16 0931  BP: 138/76  Pulse: 70  Weight: 168 lb 9.6 oz (76.5 kg)   Body mass index is 30.63 kg/m.  Generalized: Pleasant frail elderly Caucasian lady in no acute distress   . Afebrile. Head is nontraumatic. Neck is supple without bruit.    Cardiac exam no murmur or gallop. Lungs are clear to auscultation. Distal pulses are well felt. Neurological examination  Mentation: Alert oriented to time, place, history taking. Follows all commands speech and language fluent Cranial nerve II-XII: Pupils were equal round reactive to light. Extraocular movements were full, visual field were full on confrontational test. Facial sensation and strength were normal. Uvula tongue midline. Head turning and shoulder shrug  were normal and symmetric. Motor: The motor testing reveals 5 over 5 strength of all 4 extremities. Good symmetric motor tone is noted throughout.  Sensory: Sensory testing is intact to soft touch on all 4 extremities. No evidence of extinction is noted.  Coordination: Cerebellar testing  reveals good finger-nose-finger and heel-to-shin bilaterally.  Gait and station: Gait is normal. Tandem gait is slightly abnormal l. Romberg is negative.  Reflexes: Deep tendon reflexes are symmetric and normal bilaterally.   DIAGNOSTIC DATA (LABS, IMAGING, TESTING) - I reviewed patient records, labs, notes, testing and imaging myself where available.  Lab Results  Component Value Date   WBC 9.7 03/22/2016   HGB 14.5 03/22/2016   HCT 42.0 03/22/2016   MCV 88.2 03/22/2016   PLT 463.0 (H) 03/22/2016      Component Value Date/Time   NA 140 01/12/2016 0905   K 3.7 01/12/2016 0905   CL 104 01/12/2016 0905   CO2 26 01/12/2016 0905   GLUCOSE 129 (H) 01/12/2016 0905   BUN 26 (H) 01/12/2016 0905   CREATININE 1.12 01/12/2016 0905   CALCIUM 9.3 01/12/2016 0905   PROT 7.2 07/12/2015 0911   ALBUMIN 4.2 07/12/2015 0911   AST 21 07/12/2015 0911   ALT 19 07/12/2015 0911   ALKPHOS 101 07/12/2015 0911   BILITOT 0.5 07/12/2015 0911   GFRNONAA 59 (L) 11/18/2011 1142   GFRAA 69 (L) 11/18/2011 1142   Lab Results  Component Value Date   CHOL 218 (H) 07/12/2015   HDL 77.20 07/12/2015   LDLCALC 109 (H) 07/12/2015   LDLDIRECT 113.8 06/05/2007   TRIG 159.0 (H) 07/12/2015   CHOLHDL 3 07/12/2015   Lab Results  Component Value Date   HGBA1C 5.9 06/29/2014   No results found for: VITAMINB12 Lab Results  Component Value Date   TSH 0.96 06/29/2014      ASSESSMENT AND PLAN 83 y.o. year old female  has a past medical history of Adenomatous polyps; Cerebrovascular accident ([place]); Cervical cancer ([place]); Diverticulosis; Edema; Effusion of ankle and foot joint; GERD (gastroesophageal reflux disease); History of UTI; Hypertension; Observation for suspected cardiovascular disease; Other abnormal blood chemistry; Peptic ulcer disease; Pyloric stenosis; S/P total abdominal hysterectomy; and Special screening for malignant neoplasm of colon. here with:  1. History of  Cryptogenic right frontal infarct  September 2013 doing well   Overall the patient is doing well. I had a long d/w patient about her remote cryptogenic stroke, risk for recurrent stroke/TIAs, personally independently reviewed imaging studies and stroke evaluation results and answered questions.Continue Plavix for secondary stroke prevention and maintain strict control of hypertension with blood pressure goal below 130/90, diabetes with hemoglobin A1c goal below 6.5% and lipids with  LDL cholesterol goal below 70 mg/dL. I also advised the patient to eat a healthy diet with plenty of whole grains, cereals, fruits and vegetables, exercise regularly and maintain ideal body weight >o scheduled followup in the future with me in the future is necessary since she has been free of stroke symptoms for 3-1/2 years. Check screening follow-up carotid ultrasound study. Greater than 50% time during this 25 minute visit was spent on counseling and coordination of care about her stroke risk, prevention and treatment discussion She advised to keep her scheduled follow-up appointment with primary physician and have yearly lipid profile and hemoglobin A1c checked at that visit.  Antony Contras, MD 05/20/2016, 10:10 AM [facility] [address] [place], [place] [address] [phone]

## 2016-05-30 ENCOUNTER — Ambulatory Visit (INDEPENDENT_AMBULATORY_CARE_PROVIDER_SITE_OTHER): Payer: Medicare Other

## 2016-05-30 DIAGNOSIS — I6529 Occlusion and stenosis of unspecified carotid artery: Secondary | ICD-10-CM

## 2016-06-19 ENCOUNTER — Telehealth: Payer: Self-pay

## 2016-06-19 NOTE — Telephone Encounter (Signed)
-----   Message from Garvin Fila, MD sent at 06/19/2016  9:02 AM EDT ----- Mitchell Heir inform the patient had carotid Doppler study was normal

## 2016-06-19 NOTE — Telephone Encounter (Signed)
Rn call patients sister Lesleigh Noe on dpr form. Rn stated her sisters doppler was normal. PT sister verbalized understanding.

## 2016-07-15 ENCOUNTER — Encounter: Payer: Medicare Other | Admitting: Family Medicine

## 2016-07-26 ENCOUNTER — Encounter: Payer: Self-pay | Admitting: Family Medicine

## 2016-07-26 ENCOUNTER — Ambulatory Visit (INDEPENDENT_AMBULATORY_CARE_PROVIDER_SITE_OTHER): Payer: Medicare Other | Admitting: Family Medicine

## 2016-07-26 VITALS — BP 136/58 | HR 71 | Temp 97.9°F | Ht 63.5 in | Wt 169.4 lb

## 2016-07-26 DIAGNOSIS — K219 Gastro-esophageal reflux disease without esophagitis: Secondary | ICD-10-CM

## 2016-07-26 DIAGNOSIS — Z0000 Encounter for general adult medical examination without abnormal findings: Secondary | ICD-10-CM | POA: Diagnosis not present

## 2016-07-26 DIAGNOSIS — E7800 Pure hypercholesterolemia, unspecified: Secondary | ICD-10-CM | POA: Diagnosis not present

## 2016-07-26 DIAGNOSIS — R739 Hyperglycemia, unspecified: Secondary | ICD-10-CM

## 2016-07-26 LAB — LIPID PANEL
CHOL/HDL RATIO: 3
Cholesterol: 213 mg/dL — ABNORMAL HIGH (ref 0–200)
HDL: 74 mg/dL (ref >39.00)
LDL Cholesterol: 109 mg/dL — ABNORMAL HIGH (ref 0–99)
NONHDL: 138.61
TRIGLYCERIDES: 149 mg/dL (ref 0.0–149.0)
VLDL: 29.8 mg/dL (ref 0.0–40.0)

## 2016-07-26 LAB — COMPREHENSIVE METABOLIC PANEL
ALT: 14 U/L (ref 0–35)
AST: 15 U/L (ref 0–37)
Albumin: 4.2 g/dL (ref 3.5–5.2)
Alkaline Phosphatase: 106 U/L (ref 39–117)
BUN: 20 mg/dL (ref 6–23)
CALCIUM: 9.9 mg/dL (ref 8.4–10.5)
CHLORIDE: 101 meq/L (ref 96–112)
CO2: 30 meq/L (ref 19–32)
CREATININE: 1.18 mg/dL (ref 0.40–1.20)
GFR: 45.59 mL/min — AB (ref >60.00)
Glucose, Bld: 108 mg/dL — ABNORMAL HIGH (ref 70–99)
POTASSIUM: 4.1 meq/L (ref 3.5–5.1)
Sodium: 138 mEq/L (ref 135–145)
TOTAL PROTEIN: 7.3 g/dL (ref 6.0–8.3)
Total Bilirubin: 0.5 mg/dL (ref 0.2–1.2)

## 2016-07-26 LAB — CBC
HCT: 41.2 % (ref 36.0–46.0)
HEMOGLOBIN: 14.1 g/dL (ref 12.0–15.0)
MCHC: 34.3 g/dL (ref 30.0–36.0)
MCV: 89.5 fl (ref 78.0–100.0)
PLATELETS: 395 10*3/uL (ref 150.0–400.0)
RBC: 4.6 Mil/uL (ref 3.87–5.11)
RDW: 13 % (ref 11.5–15.5)
WBC: 7.9 10*3/uL (ref 4.0–10.5)

## 2016-07-26 LAB — HEMOGLOBIN A1C: HEMOGLOBIN A1C: 6.5 % (ref 4.6–6.5)

## 2016-07-26 MED ORDER — CLOPIDOGREL BISULFATE 75 MG PO TABS: ORAL_TABLET | ORAL | 3 refills | Status: DC

## 2016-07-26 MED ORDER — HYDROCHLOROTHIAZIDE 25 MG PO TABS: 25.0000 mg | ORAL_TABLET | Freq: Every day | ORAL | 3 refills | Status: DC

## 2016-07-26 MED ORDER — PANTOPRAZOLE SODIUM 20 MG PO TBEC: 20.0000 mg | DELAYED_RELEASE_TABLET | Freq: Every day | ORAL | 3 refills | Status: DC

## 2016-07-26 MED ORDER — DILTIAZEM HCL ER COATED BEADS 240 MG PO CP24: 240.0000 mg | ORAL_CAPSULE | Freq: Every day | ORAL | 3 refills | Status: DC

## 2016-07-26 NOTE — Patient Instructions (Addendum)
Please stop by lab before you go  You declined all of our immunizations- let us know if you change your mind  You declined bone density because you wouldn't want medicine even if bones thin- let us know if you change your mind  I will likely recommend once a week cholesterol medicine- I suspect you will decline but I would prefer to get your bad cholesterol under 100 with your stroke history   Mediterranean Diet A Mediterranean diet refers to food and lifestyle choices that are based on the traditions of countries located on the [employer]. This way of eating has been shown to help prevent certain conditions and improve outcomes for people who have chronic diseases, like kidney disease and heart disease. What are tips for following this plan? Lifestyle   Cook and eat meals together with your family, when possible.  Drink enough fluid to keep your urine clear or pale yellow.  Be physically active every day. This includes:  Aerobic exercise like running or swimming.  Leisure activities like gardening, walking, or housework.  Get 7-8 hours of sleep each night.  If recommended by your health care provider, drink red wine in moderation. This means 1 glass a day for nonpregnant women and 2 glasses a day for men. A glass of wine equals 5 oz (150 mL). Reading food labels   Check the serving size of packaged foods. For foods such as rice and pasta, the serving size refers to the amount of cooked product, not dry.  Check the total fat in packaged foods. Avoid foods that have saturated fat or trans fats.  Check the ingredients list for added sugars, such as corn syrup. Shopping   At the grocery store, buy most of your food from the areas near the walls of the store. This includes:  Fresh fruits and vegetables (produce).  Grains, beans, nuts, and seeds. Some of these may be available in unpackaged forms or large amounts (in bulk).  Fresh seafood.  Poultry and eggs.  Low-fat  dairy products.  Buy whole ingredients instead of prepackaged foods.  Buy fresh fruits and vegetables in-season from local farmers markets.  Buy frozen fruits and vegetables in resealable bags.  If you do not have access to quality fresh seafood, buy precooked frozen shrimp or canned fish, such as tuna, salmon, or sardines.  Buy small amounts of raw or cooked vegetables, salads, or olives from the deli or salad bar at your store.  Stock your pantry so you always have certain foods on hand, such as olive oil, canned tuna, canned tomatoes, rice, pasta, and beans. Cooking   Cook foods with extra-virgin olive oil instead of using butter or other vegetable oils.  Have meat as a side dish, and have vegetables or grains as your main dish. This means having meat in small portions or adding small amounts of meat to foods like pasta or stew.  Use beans or vegetables instead of meat in common dishes like chili or lasagna.  Experiment with different cooking methods. Try roasting or broiling vegetables instead of steaming or sauteing them.  Add frozen vegetables to soups, stews, pasta, or rice.  Add nuts or seeds for added healthy fat at each meal. You can add these to yogurt, salads, or vegetable dishes.  Marinate fish or vegetables using olive oil, lemon juice, garlic, and fresh herbs. Meal planning   Plan to eat 1 vegetarian meal one day each week. Try to work up to 2 vegetarian meals, if possible.  Eat seafood 2 or more times a week.  Have healthy snacks readily available, such as:  Vegetable sticks with hummus.  Greek yogurt.  Fruit and nut trail mix.  Eat balanced meals throughout the week. This includes:  Fruit: 2-3 servings a day  Vegetables: 4-5 servings a day  Low-fat dairy: 2 servings a day  Fish, poultry, or lean meat: 1 serving a day  Beans and legumes: 2 or more servings a week  Nuts and seeds: 1-2 servings a day  Whole grains: 6-8 servings a  day  Extra-virgin olive oil: 3-4 servings a day  Limit red meat and sweets to only a few servings a month What are my food choices?  Mediterranean diet  Recommended  Grains: Whole-grain pasta. Brown rice. Bulgar wheat. Polenta. Couscous. Whole-wheat bread. [place].  Vegetables: Artichokes. Beets. Broccoli. Cabbage. Carrots. Eggplant. Green beans. Chard. Kale. Spinach. Onions. Leeks. Peas. Squash. Tomatoes. Peppers. Radishes.  Fruits: Apples. Apricots. Avocado. Berries. Bananas. Cherries. Dates. Figs. Grapes. Lemons. Melon. Oranges. Peaches. Plums. Pomegranate.  Meats and other protein foods: Beans. Almonds. Sunflower seeds. Pine nuts. Peanuts. [place]. Salmon. Scallops. Shrimp. [place]. Tilapia. Clams. Oysters. Eggs.  Dairy: Low-fat milk. Cheese. Greek yogurt.  Beverages: Water. Red wine. Herbal tea.  Fats and oils: Extra virgin olive oil. Avocado oil. Grape seed oil.  Sweets and desserts: [place] yogurt with honey. Baked apples. Poached pears. Trail mix.  Seasoning and other foods: Basil. Cilantro. Coriander. Cumin. Mint. Parsley. Sage. Rosemary. Tarragon. Garlic. Oregano. Thyme. Pepper. Balsalmic vinegar. Tahini. Hummus. Tomato sauce. Olives. Mushrooms.  Limit these  Grains: Prepackaged pasta or rice dishes. Prepackaged cereal with added sugar.  Vegetables: Deep fried potatoes (french fries).  Fruits: Fruit canned in syrup.  Meats and other protein foods: Beef. Pork. Lamb. Poultry with skin. Hot dogs. Berniece Salines.  Dairy: Ice cream. Sour cream. Whole milk.  Beverages: Juice. Sugar-sweetened soft drinks. Beer. Liquor and spirits.  Fats and oils: Butter. Canola oil. Vegetable oil. Beef fat (tallow). Lard.  Sweets and desserts: Cookies. Cakes. Pies. Candy.  Seasoning and other foods: Mayonnaise. Premade sauces and marinades.  The items listed may not be a complete list. Talk with your dietitian about what dietary choices are right for you. Summary  The Mediterranean diet  includes both food and lifestyle choices.  Eat a variety of fresh fruits and vegetables, beans, nuts, seeds, and whole grains.  Limit the amount of red meat and sweets that you eat.  Talk with your health care provider about whether it is safe for you to drink red wine in moderation. This means 1 glass a day for nonpregnant women and 2 glasses a day for men. A glass of wine equals 5 oz (150 mL). This information is not intended to replace advice given to you by your health care provider. Make sure you discuss any questions you have with your health care provider. Document Released: 10/19/2015 Document Revised: 11/21/2015 Document Reviewed: 10/19/2015 Elsevier Interactive Patient Education  2017 [employer].

## 2016-07-26 NOTE — Progress Notes (Signed)
Phone: [phone]  Subjective:  Patient presents today for their annual physical. Chief complaint-noted.   See problem oriented charting- ROS- full  review of systems was completed and negative except for: some hip and knee pain  The following were reviewed and entered/updated in epic: Past Medical History:  Diagnosis Date  . Adenomatous polyps   . Cerebrovascular accident ([place])   . Cervical cancer ([place])    Cervical Ca-no radiation or chemo  . Diverticulosis   . Edema   . Effusion of ankle and foot joint   . GERD (gastroesophageal reflux disease)   . History of UTI   . Hypertension   . Observation for suspected cardiovascular disease   . Other abnormal blood chemistry   . Peptic ulcer disease   . Pyloric stenosis   . S/P total abdominal hysterectomy   . Special screening for malignant neoplasm of colon    Patient Active Problem List   Diagnosis Date Noted  . ACP (advance care planning) 12/17/2012    Priority: High  . Cerebrovascular accident [facility])     Priority: High  . Hyperlipidemia 06/28/2014    Priority: Medium  . Hyperglycemia 06/28/2014    Priority: Medium  . Essential hypertension 12/02/2006    Priority: Medium  . Routine health maintenance 08/25/2011    Priority: Low  . GERD 12/02/2006    Priority: Low   Past Surgical History:  Procedure Laterality Date  . ABDOMINAL HYSTERECTOMY     including cervix  . CATARACT EXTRACTION  2008   Dr Kathrin Penner  . cyst on right hand removed    . TEE WITHOUT CARDIOVERSION  11/20/2011   TEE with bubble study-PFO    Family History  Problem Relation Age of Onset  . Heart failure Mother   . Hypertension Mother   . Coronary artery disease Mother     Medications- reviewed and updated Current Outpatient Prescriptions  Medication Sig Dispense Refill  . Bisacodyl (DULCOLAX PO) Take 1 tablet by mouth as needed.    . clopidogrel (PLAVIX) 75 MG tablet TAKE (1) TABLET DAILY. 91 tablet 3  . diltiazem (CARDIZEM CD) 240 MG  24 hr capsule Take 1 capsule (240 mg total) by mouth daily. 91 capsule 3  . hydrochlorothiazide (HYDRODIURIL) 25 MG tablet Take 1 tablet (25 mg total) by mouth daily. 91 tablet 3  . Multiple Vitamins-Minerals (CENTRUM SILVER) tablet Take 1 tablet by mouth daily.      . pantoprazole (PROTONIX) 20 MG tablet Take 1 tablet (20 mg total) by mouth daily. 91 tablet 3   No current facility-administered medications for this visit.     Allergies-reviewed and updated No Known Allergies  Social History   Social History  . Marital status: Widowed    Spouse name: N/A  . Number of children: 0  . Years of education: College   Occupational History  . Retired Retired   Social History Main Topics  . Smoking status: Former Smoker    Quit date: 03/11/1948  . Smokeless tobacco: Never Used  . Alcohol use 0.0 oz/week     Comment: Occasional wine, not very often  . Drug use: No  . Sexual activity: No   Other Topics Concern  . None   Social History Narrative   HSG, [employer]. Married '50- widowed after 53 years.Lives alone in own home. Does housework but not yardwork      On the list [facility]   ACP/End of life: No CPR, no heroic/futile treatment, i.e. prolonged ICU care. HCPA sister  [name] tele [phone].      Patient is retired from [employer] after 35 years   Right handed.   Caffeine two cups of coffee daily. Very Rare coke.      Hobbies: play golf, play bridge       Objective: BP (!) 136/58 (BP Location: Left Arm, Patient Position: Sitting, Cuff Size: Large)   Pulse 71   Temp 97.9 F (36.6 C) (Oral)   Ht 5' 3.5" (1.613 m)   Wt 169 lb 6.4 oz (76.8 kg)   SpO2 94%   BMI 29.54 kg/m  Gen: NAD, resting comfortably HEENT: Mucous membranes are moist. Oropharynx normal Neck: no thyromegaly CV: RRR no murmurs rubs or gallops Lungs: CTAB no crackles, wheeze, rhonchi Abdomen: soft/nontender/nondistended/normal bowel sounds. No rebound or guarding.  Ext: no edema Skin: warm,  dry Neuro: grossly normal, moves all extremities, PERRLA  Assessment/Plan:  83 y.o. female presenting for annual physical.  Health Maintenance counseling: 1. Anticipatory guidance: Patient counseled regarding regular dental exams q62months, eye exams yearly, wearing seatbelts.  2. Risk factor reduction:  Advised patient of need for regular exercise and diet rich and fruits and vegetables to reduce risk of heart attack and stroke. Exercise- sitting more than she used to but still walks a fair deal. Still golfing some. Diet-did not make any changes in diet. Weight stable Wt Readings from Last 3 Encounters:  07/26/16 169 lb 6.4 oz (76.8 kg)  05/20/16 168 lb 9.6 oz (76.5 kg)  03/22/16 167 lb 6 oz (75.9 kg)  3. Immunizations/screenings/ancillary studies- has declined pneumonia vaccines and flu vaccines.  Declines shingles shot. Declines tdap unless cut/scrape 4. Cervical cancer screening- aged out of screening criteria 5. Breast cancer screening-  aged out of screening criteria 6. Colon cancer screening - aged out of screening criteria. Rectal bleeding last year with anoscopy showing hemorrhoids (in setting of constipation). She opted  7. Skin cancer screening- declines any skin interventions- declines in office procedures even if concerning lesions or dermatolgy referral 8. Bone density- states would not take medicine for thin bones even if present- declines DEXA- apparently has never had  Status of chronic or acute concerns  Hyperglycemia- update a1c today  GERD- protonix 20mg  trial has been effective   HLD- lipids were above goal last year- plan was to improve diet/exercise and recheck this year. Does have stroke history but neurology has been ok with LDL under 100  HTN- reasonable control- with diastolic as low as it is unwise to lower further. Continue hctz 25mg , diltiazem 240mg  XL.   History of stroke- continue on plavix. folows with Dr. Leonie Man  Starting to have some knee and hip pain.   Saw a chiropractor for a few months and didn't seem to help- has calmed back down- very little pain at this point . Aleve about once a month. Offered voltaren option but she declines  1 year CPE. Declines awv  Orders Placed This Encounter  Procedures  . CBC    [place]  . Lipid panel    [place]    Order Specific Question:   Has the patient fasted?    Answer:   No  . Comprehensive metabolic panel    [place]    Order Specific Question:   Has the patient fasted?    Answer:   No  . Hemoglobin A1c    [place]    Meds ordered this encounter  Medications  . clopidogrel (PLAVIX) 75 MG tablet    Sig: TAKE (1) TABLET  DAILY.    Dispense:  91 tablet    Refill:  3  . diltiazem (CARDIZEM CD) 240 MG 24 hr capsule    Sig: Take 1 capsule (240 mg total) by mouth daily.    Dispense:  91 capsule    Refill:  3  . hydrochlorothiazide (HYDRODIURIL) 25 MG tablet    Sig: Take 1 tablet (25 mg total) by mouth daily.    Dispense:  91 tablet    Refill:  3  . pantoprazole (PROTONIX) 20 MG tablet    Sig: Take 1 tablet (20 mg total) by mouth daily.    Dispense:  91 tablet    Refill:  3    Return precautions advised.  Garret Reddish, MD

## 2017-04-03 ENCOUNTER — Encounter: Payer: Self-pay | Admitting: Family Medicine

## 2017-04-03 ENCOUNTER — Other Ambulatory Visit: Payer: Self-pay

## 2017-04-03 ENCOUNTER — Ambulatory Visit: Payer: Medicare Other | Admitting: Family Medicine

## 2017-04-03 VITALS — BP 134/60 | HR 76 | Temp 97.6°F | Resp 18 | Ht 63.39 in | Wt 166.2 lb

## 2017-04-03 DIAGNOSIS — R609 Edema, unspecified: Secondary | ICD-10-CM | POA: Diagnosis not present

## 2017-04-03 DIAGNOSIS — L853 Xerosis cutis: Secondary | ICD-10-CM

## 2017-04-03 DIAGNOSIS — R21 Rash and other nonspecific skin eruption: Secondary | ICD-10-CM

## 2017-04-03 DIAGNOSIS — R600 Localized edema: Secondary | ICD-10-CM

## 2017-04-03 MED ORDER — TRIAMCINOLONE ACETONIDE 0.1 % EX CREA: 1.0000 "application " | TOPICAL_CREAM | Freq: Two times a day (BID) | CUTANEOUS | 0 refills | Status: DC

## 2017-04-03 NOTE — Patient Instructions (Addendum)
Itching area on leg may be due to dry skin, or possible contact dermatitis. Either way I would recommend initially starting with triamcinolone steroid cream twice per day to itching area, Lubriderm or other lotion at least twice per day, and see other information below on dry skin care. Recheck in 2 weeks and bring other pair shoes at that time to see if those may be contributing to the rash.  If any spreading redness, ulceration, or worsening symptoms please return sooner.  Thank you for coming in today.   Drink at least 64 ounces of water daily. Consider a humidifier for the room where you sleep. Bathe once daily. Avoid using HOT water, as it dries skin.  Avoid deodorant soaps (Dial is the worst!) and stick with gentle cleansers (I like Cetaphil Liquid Cleanser). After bathing, dry off completely, then apply a thick emollient cream (I like Cetaphil Moisturizing Cream). Apply the cream twice daily, or more!   Contact Dermatitis Dermatitis is redness, soreness, and swelling (inflammation) of the skin. Contact dermatitis is a reaction to certain substances that touch the skin. There are two types of contact dermatitis:  Irritant contact dermatitis. This type is caused by something that irritates your skin, such as dry hands from washing them too much. This type does not require previous exposure to the substance for a reaction to occur. This type is more common.  Allergic contact dermatitis. This type is caused by a substance that you are allergic to, such as a nickel allergy or poison ivy. This type only occurs if you have been exposed to the substance (allergen) before. Upon a repeat exposure, your body reacts to the substance. This type is less common.  What are the causes? Many different substances can cause contact dermatitis. Irritant contact dermatitis is most commonly caused by exposure to:  Makeup.  Soaps.  Detergents.  Bleaches.  Acids.  Metal salts, such as  nickel.  Allergic contact dermatitis is most commonly caused by exposure to:  Poisonous plants.  Chemicals.  Jewelry.  Latex.  Medicines.  Preservatives in products, such as clothing.  What increases the risk? This condition is more likely to develop in:  People who have jobs that expose them to irritants or allergens.  People who have certain medical conditions, such as asthma or eczema.  What are the signs or symptoms? Symptoms of this condition may occur anywhere on your body where the irritant has touched you or is touched by you. Symptoms include:  Dryness or flaking.  Redness.  Cracks.  Itching.  Pain or a burning feeling.  Blisters.  Drainage of small amounts of blood or clear fluid from skin cracks.  With allergic contact dermatitis, there may also be swelling in areas such as the eyelids, mouth, or genitals. How is this diagnosed? This condition is diagnosed with a medical history and physical exam. A patch skin test may be performed to help determine the cause. If the condition is related to your job, you may need to see an occupational medicine specialist. How is this treated? Treatment for this condition includes figuring out what caused the reaction and protecting your skin from further contact. Treatment may also include:  Steroid creams or ointments. Oral steroid medicines may be needed in more severe cases.  Antibiotics or antibacterial ointments, if a skin infection is present.  Antihistamine lotion or an antihistamine taken by mouth to ease itching.  A bandage (dressing).  Follow these instructions at home: Skin Care  Moisturize your skin as  needed.  Apply cool compresses to the affected areas.  Try taking a bath with: ? Epsom salts. Follow the instructions on the packaging. You can get these at your local pharmacy or grocery store. ? Baking soda. Pour a small amount into the bath as directed by your health care provider. ? Colloidal  oatmeal. Follow the instructions on the packaging. You can get this at your local pharmacy or grocery store.  Try applying baking soda paste to your skin. Stir water into baking soda until it reaches a paste-like consistency.  Do not scratch your skin.  Bathe less frequently, such as every other day.  Bathe in lukewarm water. Avoid using hot water. Medicines  Take or apply over-the-counter and prescription medicines only as told by your health care provider.  If you were prescribed an antibiotic medicine, take or apply your antibiotic as told by your health care provider. Do not stop using the antibiotic even if your condition starts to improve. General instructions  Keep all follow-up visits as told by your health care provider. This is important.  Avoid the substance that caused your reaction. If you do not know what caused it, keep a journal to try to track what caused it. Write down: ? What you eat. ? What cosmetic products you use. ? What you drink. ? What you wear in the affected area. This includes jewelry.  If you were given a dressing, take care of it as told by your health care provider. This includes when to change and remove it. Contact a health care provider if:  Your condition does not improve with treatment.  Your condition gets worse.  You have signs of infection such as swelling, tenderness, redness, soreness, or warmth in the affected area.  You have a fever.  You have new symptoms. Get help right away if:  You have a severe headache, neck pain, or neck stiffness.  You vomit.  You feel very sleepy.  You notice red streaks coming from the affected area.  Your bone or joint underneath the affected area becomes painful after the skin has healed.  The affected area turns darker.  You have difficulty breathing. This information is not intended to replace advice given to you by your health care provider. Make sure you discuss any questions you have with  your health care provider. Document Released: 02/23/2000 Document Revised: 08/03/2015 Document Reviewed: 07/13/2014 Elsevier Interactive Patient Education  2018 [employer].    IF you received an x-ray today, you will receive an invoice from [facility]. Please contact [facility] at [phone] with questions or concerns regarding your invoice.   IF you received labwork today, you will receive an invoice from [facility]. Please contact [facility] at [phone] with questions or concerns regarding your invoice.   Our billing staff will not be able to assist you with questions regarding bills from these companies.  You will be contacted with the lab results as soon as they are available. The fastest way to get your results is to activate your My Chart account. Instructions are located on the last page of this paperwork. If you have not heard from [place] regarding the results in 2 weeks, please contact this office.

## 2017-04-03 NOTE — Progress Notes (Signed)
Subjective:  By signing my name below, I, [name], attest that this documentation has been prepared under the direction and in the presence of Wendie Agreste, MD Electronically Signed: Ladene Artist, ED Scribe 04/03/2017 at 4:43 PM.   Patient ID: [name], female    DOB: 09-06-1922, 84 y.o.   MRN: [id number]  Chief Complaint  Patient presents with  . Rash    left leg and ankle area pt states it itches at night x6weeks ago    HPI  [name] is a 84 y.o. female who presents to Primary Care at [facility] complaining of a non-tender, pruritic rash to both lower extremities just above the ankles x 6 wks. Pt states that the area started as a small patch to her L leg ~6wks ago that she would rub against the other ankle at night to scratch it. She noticed that the rash began to spread to the R ankle ~2 days ago. Reports itching is more severe at night. She has tried applying Lubriderm to the areas after showering every 2-3 days. Denies wearing new shoes. No h/o skin CA.  Patient Active Problem List   Diagnosis Date Noted  . Hyperlipidemia 06/28/2014  . Hyperglycemia 06/28/2014  . ACP (advance care planning) 12/17/2012  . Cerebrovascular accident ([place])   . Routine health maintenance 08/25/2011  . Essential hypertension 12/02/2006  . GERD 12/02/2006   Past Medical History:  Diagnosis Date  . Adenomatous polyps   . Arthritis   . Cerebrovascular accident ([place])   . Cervical cancer ([place])    Cervical Ca-no radiation or chemo  . Diverticulosis   . Edema   . Effusion of ankle and foot joint   . GERD (gastroesophageal reflux disease)   . History of UTI   . Hypertension   . Observation for suspected cardiovascular disease   . Other abnormal blood chemistry   . Peptic ulcer disease   . Pyloric stenosis   . S/P total abdominal hysterectomy   . Special screening for malignant neoplasm of colon    Past Surgical History:  Procedure Laterality Date  . ABDOMINAL HYSTERECTOMY     including cervix  . CATARACT EXTRACTION  2008   Dr Kathrin Penner  . cyst on right hand removed    . TEE WITHOUT CARDIOVERSION  11/20/2011   TEE with bubble study-PFO   No Known Allergies Prior to Admission medications   Medication Sig Start Date End Date Taking? Authorizing Provider  Bisacodyl (DULCOLAX PO) Take 1 tablet by mouth as needed.   Yes [provider]  clopidogrel (PLAVIX) 75 MG tablet TAKE (1) TABLET DAILY. 07/26/16  Yes Marin Olp, MD  diltiazem (CARDIZEM CD) 240 MG 24 hr capsule Take 1 capsule (240 mg total) by mouth daily. 07/26/16  Yes Marin Olp, MD  hydrochlorothiazide (HYDRODIURIL) 25 MG tablet Take 1 tablet (25 mg total) by mouth daily. 07/26/16  Yes Marin Olp, MD  Multiple Vitamins-Minerals (CENTRUM SILVER) tablet Take 1 tablet by mouth daily.     Yes [provider]  pantoprazole (PROTONIX) 20 MG tablet Take 1 tablet (20 mg total) by mouth daily. 07/26/16  Yes Marin Olp, MD   Social History   Socioeconomic History  . Marital status: Widowed    Spouse name: Not on file  . Number of children: 0  . Years of education: College  . Highest education level: Not on file  Social Needs  . Financial resource strain: Not on file  .  Food insecurity - worry: Not on file  . Food insecurity - inability: Not on file  . Transportation needs - medical: Not on file  . Transportation needs - non-medical: Not on file  Occupational History  . Occupation: Retired    Fish farm manager: RETIRED  Tobacco Use  . Smoking status: Former Smoker    Last attempt to quit: 03/11/1948    Years since quitting: 69.1  . Smokeless tobacco: Never Used  Substance and Sexual Activity  . Alcohol use: Yes    Alcohol/week: 0.0 oz    Comment: Occasional wine, not very often  . Drug use: No  . Sexual activity: No  Other Topics Concern  . Not on file  Social History Narrative   HSG, [employer]. Married '50- widowed after 50 years.Lives alone in own home. Does housework  but not yardwork      On the list [facility]   ACP/End of life: No CPR, no heroic/futile treatment, i.e. prolonged ICU care. HCPA sister [name] tele [phone].      Patient is retired from [employer] after 35 years   Right handed.   Caffeine two cups of coffee daily. Very Rare coke.      Hobbies: play golf, play bridge   Review of Systems  Skin: Positive for color change and rash.      Objective:   Physical Exam  Constitutional: She is oriented to person, place, and time. She appears well-developed and well-nourished. No distress.  HENT:  Head: Normocephalic and atraumatic.  Eyes: Conjunctivae and EOM are normal.  Neck: Neck supple. No tracheal deviation present.  Cardiovascular: Normal rate.  Pulmonary/Chest: Effort normal. No respiratory distress.  Musculoskeletal: Normal range of motion.  Neurological: She is alert and oriented to person, place, and time.  Skin: Skin is warm and dry.  R medial ankle: slight dry scaly areas and a patch ~3-4 cm just above the ankle. 1+ pedal edema. No significant erythema. Skin is intact. DP pulses intact. Toes are warm with cap refill less than 1 sec.  L ankle: minimal erythema, primarily scaly patch 5x4 cm. No surrounding erythema. Nontender. DP pulse intact. Toes warm. Cap refill less than 1 sec. 1+ edema to the lower third of her leg. Remainder of skin is minimal dry but no rash or erythema.  Psychiatric: She has a normal mood and affect. Her behavior is normal.  Nursing note and vitals reviewed.  Vitals:   04/03/17 1609  BP: 134/60  Pulse: 76  Resp: 18  Temp: 97.6 F (36.4 C)  TempSrc: Oral  SpO2: 97%  Weight: 166 lb 3.2 oz (75.4 kg)  Height: 5' 3.39" (1.61 m)      Assessment & Plan:   [name] is a 84 y.o. female Rash and nonspecific skin eruption  Dry skin  Peripheral edema  Left greater than right medial lower leg rash, dry skin, slightly scaly appearance. No apparent signs of infection, neurovascular  intact distally, less likely arterial insufficiency and no apparent ulceration. Possible dry skin dermatitis with secondary thickening/rash from excoriation versus contact dermatitis from shoes as similar location on right ankle. Does have some distal pedal edema without other signs of significant rash/dermatitis in edematous areas.  -Dry skin care discussed with Lubriderm or other hydrating lotion at least twice per day, triamcinolone topically twice a day to affected area, and recheck in the next 2 weeks. Consider biopsy or dermatology eval if persistent, return sooner if worsening.  -Leg elevation to help with peripheral edema, can  follow-up to discuss that further if persistent.  No orders of the defined types were placed in this encounter.  Patient Instructions    Itching area on leg may be due to dry skin, or possible contact dermatitis. Either way I would recommend initially starting with triamcinolone steroid cream twice per day to itching area, Lubriderm or other lotion at least twice per day, and see other information below on dry skin care. Recheck in 2 weeks and bring other pair shoes at that time to see if those may be contributing to the rash.  If any spreading redness, ulceration, or worsening symptoms please return sooner.  Thank you for coming in today.   Drink at least 64 ounces of water daily. Consider a humidifier for the room where you sleep. Bathe once daily. Avoid using HOT water, as it dries skin.  Avoid deodorant soaps (Dial is the worst!) and stick with gentle cleansers (I like Cetaphil Liquid Cleanser). After bathing, dry off completely, then apply a thick emollient cream (I like Cetaphil Moisturizing Cream). Apply the cream twice daily, or more!   Contact Dermatitis Dermatitis is redness, soreness, and swelling (inflammation) of the skin. Contact dermatitis is a reaction to certain substances that touch the skin. There are two types of contact dermatitis:  Irritant  contact dermatitis. This type is caused by something that irritates your skin, such as dry hands from washing them too much. This type does not require previous exposure to the substance for a reaction to occur. This type is more common.  Allergic contact dermatitis. This type is caused by a substance that you are allergic to, such as a nickel allergy or poison ivy. This type only occurs if you have been exposed to the substance (allergen) before. Upon a repeat exposure, your body reacts to the substance. This type is less common.  What are the causes? Many different substances can cause contact dermatitis. Irritant contact dermatitis is most commonly caused by exposure to:  Makeup.  Soaps.  Detergents.  Bleaches.  Acids.  Metal salts, such as nickel.  Allergic contact dermatitis is most commonly caused by exposure to:  Poisonous plants.  Chemicals.  Jewelry.  Latex.  Medicines.  Preservatives in products, such as clothing.  What increases the risk? This condition is more likely to develop in:  People who have jobs that expose them to irritants or allergens.  People who have certain medical conditions, such as asthma or eczema.  What are the signs or symptoms? Symptoms of this condition may occur anywhere on your body where the irritant has touched you or is touched by you. Symptoms include:  Dryness or flaking.  Redness.  Cracks.  Itching.  Pain or a burning feeling.  Blisters.  Drainage of small amounts of blood or clear fluid from skin cracks.  With allergic contact dermatitis, there may also be swelling in areas such as the eyelids, mouth, or genitals. How is this diagnosed? This condition is diagnosed with a medical history and physical exam. A patch skin test may be performed to help determine the cause. If the condition is related to your job, you may need to see an occupational medicine specialist. How is this treated? Treatment for this condition  includes figuring out what caused the reaction and protecting your skin from further contact. Treatment may also include:  Steroid creams or ointments. Oral steroid medicines may be needed in more severe cases.  Antibiotics or antibacterial ointments, if a skin infection is present.  Antihistamine lotion  or an antihistamine taken by mouth to ease itching.  A bandage (dressing).  Follow these instructions at home: [place] your skin as needed.  Apply cool compresses to the affected areas.  Try taking a bath with: ? Epsom salts. Follow the instructions on the packaging. You can get these at your local pharmacy or grocery store. ? Baking soda. Pour a small amount into the bath as directed by your health care provider. ? Colloidal oatmeal. Follow the instructions on the packaging. You can get this at your local pharmacy or grocery store.  Try applying baking soda paste to your skin. Stir water into baking soda until it reaches a paste-like consistency.  Do not scratch your skin.  Bathe less frequently, such as every other day.  Bathe in lukewarm water. Avoid using hot water. Medicines  Take or apply over-the-counter and prescription medicines only as told by your health care provider.  If you were prescribed an antibiotic medicine, take or apply your antibiotic as told by your health care provider. Do not stop using the antibiotic even if your condition starts to improve. General instructions  Keep all follow-up visits as told by your health care provider. This is important.  Avoid the substance that caused your reaction. If you do not know what caused it, keep a journal to try to track what caused it. Write down: ? What you eat. ? What cosmetic products you use. ? What you drink. ? What you wear in the affected area. This includes jewelry.  If you were given a dressing, take care of it as told by your health care provider. This includes when to change and remove  it. Contact a health care provider if:  Your condition does not improve with treatment.  Your condition gets worse.  You have signs of infection such as swelling, tenderness, redness, soreness, or warmth in the affected area.  You have a fever.  You have new symptoms. Get help right away if:  You have a severe headache, neck pain, or neck stiffness.  You vomit.  You feel very sleepy.  You notice red streaks coming from the affected area.  Your bone or joint underneath the affected area becomes painful after the skin has healed.  The affected area turns darker.  You have difficulty breathing. This information is not intended to replace advice given to you by your health care provider. Make sure you discuss any questions you have with your health care provider. Document Released: 02/23/2000 Document Revised: 08/03/2015 Document Reviewed: 07/13/2014 Elsevier Interactive Patient Education  2018 [employer].    IF you received an x-ray today, you will receive an invoice from [facility]. Please contact [facility] at [phone] with questions or concerns regarding your invoice.   IF you received labwork today, you will receive an invoice from [place]. Please contact [facility] at [phone] with questions or concerns regarding your invoice.   Our billing staff will not be able to assist you with questions regarding bills from these companies.  You will be contacted with the lab results as soon as they are available. The fastest way to get your results is to activate your My Chart account. Instructions are located on the last page of this paperwork. If you have not heard from [place] regarding the results in 2 weeks, please contact this office.      I personally performed the services described in this documentation, which was scribed in my presence. The recorded information has been reviewed  and considered for accuracy and completeness, addended by me as  needed, and agree with information above.  Signed,   Merri Ray, MD Primary Care at [place].  04/03/17 5:14 PM

## 2017-04-21 ENCOUNTER — Encounter: Payer: Self-pay | Admitting: Family Medicine

## 2017-04-21 ENCOUNTER — Other Ambulatory Visit: Payer: Self-pay

## 2017-04-21 ENCOUNTER — Ambulatory Visit: Payer: Medicare Other | Admitting: Family Medicine

## 2017-04-21 VITALS — BP 134/60 | HR 77 | Temp 97.4°F | Resp 18 | Ht 63.39 in | Wt 164.6 lb

## 2017-04-21 DIAGNOSIS — R21 Rash and other nonspecific skin eruption: Secondary | ICD-10-CM | POA: Diagnosis not present

## 2017-04-21 DIAGNOSIS — L259 Unspecified contact dermatitis, unspecified cause: Secondary | ICD-10-CM

## 2017-04-21 NOTE — Patient Instructions (Addendum)
I'm glad to hear the rash has improved. Okay to continue the steroid cream up to twice per day as well as lotion twice per day. If that rash does not resolve in the next month, or worsens, let me know and I will refer you to dermatology. Thank you for coming in today.   Contact Dermatitis Dermatitis is redness, soreness, and swelling (inflammation) of the skin. Contact dermatitis is a reaction to certain substances that touch the skin. There are two types of contact dermatitis:  Irritant contact dermatitis. This type is caused by something that irritates your skin, such as dry hands from washing them too much. This type does not require previous exposure to the substance for a reaction to occur. This type is more common.  Allergic contact dermatitis. This type is caused by a substance that you are allergic to, such as a nickel allergy or poison ivy. This type only occurs if you have been exposed to the substance (allergen) before. Upon a repeat exposure, your body reacts to the substance. This type is less common.  What are the causes? Many different substances can cause contact dermatitis. Irritant contact dermatitis is most commonly caused by exposure to:  Makeup.  Soaps.  Detergents.  Bleaches.  Acids.  Metal salts, such as nickel.  Allergic contact dermatitis is most commonly caused by exposure to:  Poisonous plants.  Chemicals.  Jewelry.  Latex.  Medicines.  Preservatives in products, such as clothing.  What increases the risk? This condition is more likely to develop in:  People who have jobs that expose them to irritants or allergens.  People who have certain medical conditions, such as asthma or eczema.  What are the signs or symptoms? Symptoms of this condition may occur anywhere on your body where the irritant has touched you or is touched by you. Symptoms include:  Dryness or flaking.  Redness.  Cracks.  Itching.  Pain or a burning  feeling.  Blisters.  Drainage of small amounts of blood or clear fluid from skin cracks.  With allergic contact dermatitis, there may also be swelling in areas such as the eyelids, mouth, or genitals. How is this diagnosed? This condition is diagnosed with a medical history and physical exam. A patch skin test may be performed to help determine the cause. If the condition is related to your job, you may need to see an occupational medicine specialist. How is this treated? Treatment for this condition includes figuring out what caused the reaction and protecting your skin from further contact. Treatment may also include:  Steroid creams or ointments. Oral steroid medicines may be needed in more severe cases.  Antibiotics or antibacterial ointments, if a skin infection is present.  Antihistamine lotion or an antihistamine taken by mouth to ease itching.  A bandage (dressing).  Follow these instructions at home: [place] your skin as needed.  Apply cool compresses to the affected areas.  Try taking a bath with: ? Epsom salts. Follow the instructions on the packaging. You can get these at your local pharmacy or grocery store. ? Baking soda. Pour a small amount into the bath as directed by your health care provider. ? Colloidal oatmeal. Follow the instructions on the packaging. You can get this at your local pharmacy or grocery store.  Try applying baking soda paste to your skin. Stir water into baking soda until it reaches a paste-like consistency.  Do not scratch your skin.  Bathe less frequently, such as every other day.  Bathe in lukewarm water. Avoid using hot water. Medicines  Take or apply over-the-counter and prescription medicines only as told by your health care provider.  If you were prescribed an antibiotic medicine, take or apply your antibiotic as told by your health care provider. Do not stop using the antibiotic even if your condition starts to  improve. General instructions  Keep all follow-up visits as told by your health care provider. This is important.  Avoid the substance that caused your reaction. If you do not know what caused it, keep a journal to try to track what caused it. Write down: ? What you eat. ? What cosmetic products you use. ? What you drink. ? What you wear in the affected area. This includes jewelry.  If you were given a dressing, take care of it as told by your health care provider. This includes when to change and remove it. Contact a health care provider if:  Your condition does not improve with treatment.  Your condition gets worse.  You have signs of infection such as swelling, tenderness, redness, soreness, or warmth in the affected area.  You have a fever.  You have new symptoms. Get help right away if:  You have a severe headache, neck pain, or neck stiffness.  You vomit.  You feel very sleepy.  You notice red streaks coming from the affected area.  Your bone or joint underneath the affected area becomes painful after the skin has healed.  The affected area turns darker.  You have difficulty breathing. This information is not intended to replace advice given to you by your health care provider. Make sure you discuss any questions you have with your health care provider. Document Released: 02/23/2000 Document Revised: 08/03/2015 Document Reviewed: 07/13/2014 Elsevier Interactive Patient Education  2018 [employer].    IF you received an x-ray today, you will receive an invoice from [facility]. Please contact [facility] at [phone] with questions or concerns regarding your invoice.   IF you received labwork today, you will receive an invoice from [place]. Please contact [facility] at [phone] with questions or concerns regarding your invoice.   Our billing staff will not be able to assist you with questions regarding bills from these companies.  You  will be contacted with the lab results as soon as they are available. The fastest way to get your results is to activate your My Chart account. Instructions are located on the last page of this paperwork. If you have not heard from [place] regarding the results in 2 weeks, please contact this office.

## 2017-04-21 NOTE — Progress Notes (Signed)
Subjective:  By signing my name below, I, [name], attest that this documentation has been prepared under the direction and in the presence of [name] Agreste, MD Electronically Signed: Ladene Artist, ED Scribe 04/21/2017 at 10:33 AM.   Patient ID: [name], female    DOB: [birth year], 84 y.o.   MRN: [id number]  Chief Complaint  Patient presents with  . Rash    follow up on rash on ankles    HPI  [name] is a 84 y.o. female who presents to Primary Care at [facility] for f/u on ankle rash. Seen 1/24. Noticed ~6 wks prior on L leg and a few days on R leg, similar area on ankle. Apppeared to be a contact dermatitis. Treated with Lubriderm and topical triamcinolone. Advised to bring her shoes to see if any metal contact.  She brought her regular boots with metals eyelets for laces which are lower than effected area. Pt states that she wears knee highs with her boots and has been applying lotion to her legs daily with significant relief.  Patient Active Problem List   Diagnosis Date Noted  . Hyperlipidemia 06/28/2014  . Hyperglycemia 06/28/2014  . ACP (advance care planning) 12/17/2012  . Cerebrovascular accident ([place])   . Routine health maintenance 08/25/2011  . Essential hypertension 12/02/2006  . GERD 12/02/2006   Past Medical History:  Diagnosis Date  . Adenomatous polyps   . Arthritis   . Cerebrovascular accident ([place])   . Cervical cancer ([place])    Cervical Ca-no radiation or chemo  . Diverticulosis   . Edema   . Effusion of ankle and foot joint   . GERD (gastroesophageal reflux disease)   . History of UTI   . Hypertension   . Observation for suspected cardiovascular disease   . Other abnormal blood chemistry   . Peptic ulcer disease   . Pyloric stenosis   . S/P total abdominal hysterectomy   . Special screening for malignant neoplasm of colon    Past Surgical History:  Procedure Laterality Date  . ABDOMINAL HYSTERECTOMY     including cervix  . CATARACT  EXTRACTION  2008   Dr Kathrin Penner  . cyst on right hand removed    . TEE WITHOUT CARDIOVERSION  11/20/2011   TEE with bubble study-PFO   No Known Allergies Prior to Admission medications   Medication Sig Start Date End Date Taking? Authorizing Provider  Bisacodyl (DULCOLAX PO) Take 1 tablet by mouth as needed.   Yes [provider]  clopidogrel (PLAVIX) 75 MG tablet TAKE (1) TABLET DAILY. 07/26/16  Yes Marin Olp, MD  diltiazem (CARDIZEM CD) 240 MG 24 hr capsule Take 1 capsule (240 mg total) by mouth daily. 07/26/16  Yes Marin Olp, MD  hydrochlorothiazide (HYDRODIURIL) 25 MG tablet Take 1 tablet (25 mg total) by mouth daily. 07/26/16  Yes Marin Olp, MD  Multiple Vitamins-Minerals (CENTRUM SILVER) tablet Take 1 tablet by mouth daily.     Yes [provider]  pantoprazole (PROTONIX) 20 MG tablet Take 1 tablet (20 mg total) by mouth daily. 07/26/16  Yes Marin Olp, MD  triamcinolone cream (KENALOG) 0.1 % Apply 1 application topically 2 (two) times daily. 04/03/17  Yes [name] Agreste, MD   Social History   Socioeconomic History  . Marital status: Widowed    Spouse name: Not on file  . Number of children: 0  . Years of education: College  . Highest education level: Not on  file  Social Needs  . Financial resource strain: Not on file  . Food insecurity - worry: Not on file  . Food insecurity - inability: Not on file  . Transportation needs - medical: Not on file  . Transportation needs - non-medical: Not on file  Occupational History  . Occupation: Retired    Fish farm manager: RETIRED  Tobacco Use  . Smoking status: Former Smoker    Last attempt to quit: 03/11/1948    Years since quitting: 69.1  . Smokeless tobacco: Never Used  Substance and Sexual Activity  . Alcohol use: Yes    Alcohol/week: 0.0 oz    Comment: Occasional wine, not very often  . Drug use: No  . Sexual activity: No  Other Topics Concern  . Not on file  Social History  Narrative   HSG, [employer]. Married '50- widowed after 87 years.Lives alone in own home. Does housework but not yardwork      On the list [facility]   ACP/End of life: No CPR, no heroic/futile treatment, i.e. prolonged ICU care. HCPA sister [name] tele [phone].      Patient is retired from [employer] after 35 years   Right handed.   Caffeine two cups of coffee daily. Very Rare coke.      Hobbies: play golf, play bridge   Review of Systems  Skin: Positive for rash.      Objective:   Physical Exam  Constitutional: She is oriented to person, place, and time. She appears well-developed and well-nourished. No distress.  HENT:  Head: Normocephalic and atraumatic.  Eyes: Conjunctivae and EOM are normal.  Neck: Neck supple. No tracheal deviation present.  Cardiovascular: Normal rate.  Pulmonary/Chest: Effort normal. No respiratory distress.  Musculoskeletal: Normal range of motion.  Neurological: She is alert and oriented to person, place, and time.  Skin: Skin is warm and dry.  Very faint hyperpigmentation on L and R medial ankles. Significantly improved from last visit. Minimal dry skin on the L.  Psychiatric: She has a normal mood and affect. Her behavior is normal.  Nursing note and vitals reviewed.  Vitals:   04/21/17 0947  BP: 134/60  Pulse: 77  Resp: 18  Temp: (!) 97.4 F (36.3 C)  TempSrc: Oral  SpO2: 96%  Weight: 164 lb 9.6 oz (74.7 kg)  Height: 5' 3.39" (1.61 m)      Assessment & Plan:   [name] is a 84 y.o. female Contact dermatitis, unspecified contact dermatitis type, unspecified trigger  Rash  Improved, suspected contact dermatitis. Continue topical steroid if needed, hydrating lotion and rtc precautions if not continuing to improve -sooner if worse.   No orders of the defined types were placed in this encounter.  Patient Instructions   I'm glad to hear the rash has improved. Okay to continue the steroid cream up to twice per day as well as  lotion twice per day. If that rash does not resolve in the next month, or worsens, let me know and I will refer you to dermatology. Thank you for coming in today.   Contact Dermatitis Dermatitis is redness, soreness, and swelling (inflammation) of the skin. Contact dermatitis is a reaction to certain substances that touch the skin. There are two types of contact dermatitis:  Irritant contact dermatitis. This type is caused by something that irritates your skin, such as dry hands from washing them too much. This type does not require previous exposure to the substance for a reaction to occur. This type  is more common.  Allergic contact dermatitis. This type is caused by a substance that you are allergic to, such as a nickel allergy or poison ivy. This type only occurs if you have been exposed to the substance (allergen) before. Upon a repeat exposure, your body reacts to the substance. This type is less common.  What are the causes? Many different substances can cause contact dermatitis. Irritant contact dermatitis is most commonly caused by exposure to:  Makeup.  Soaps.  Detergents.  Bleaches.  Acids.  Metal salts, such as nickel.  Allergic contact dermatitis is most commonly caused by exposure to:  Poisonous plants.  Chemicals.  Jewelry.  Latex.  Medicines.  Preservatives in products, such as clothing.  What increases the risk? This condition is more likely to develop in:  People who have jobs that expose them to irritants or allergens.  People who have certain medical conditions, such as asthma or eczema.  What are the signs or symptoms? Symptoms of this condition may occur anywhere on your body where the irritant has touched you or is touched by you. Symptoms include:  Dryness or flaking.  Redness.  Cracks.  Itching.  Pain or a burning feeling.  Blisters.  Drainage of small amounts of blood or clear fluid from skin cracks.  With allergic contact  dermatitis, there may also be swelling in areas such as the eyelids, mouth, or genitals. How is this diagnosed? This condition is diagnosed with a medical history and physical exam. A patch skin test may be performed to help determine the cause. If the condition is related to your job, you may need to see an occupational medicine specialist. How is this treated? Treatment for this condition includes figuring out what caused the reaction and protecting your skin from further contact. Treatment may also include:  Steroid creams or ointments. Oral steroid medicines may be needed in more severe cases.  Antibiotics or antibacterial ointments, if a skin infection is present.  Antihistamine lotion or an antihistamine taken by mouth to ease itching.  A bandage (dressing).  Follow these instructions at home: [place] your skin as needed.  Apply cool compresses to the affected areas.  Try taking a bath with: ? Epsom salts. Follow the instructions on the packaging. You can get these at your local pharmacy or grocery store. ? Baking soda. Pour a small amount into the bath as directed by your health care provider. ? Colloidal oatmeal. Follow the instructions on the packaging. You can get this at your local pharmacy or grocery store.  Try applying baking soda paste to your skin. Stir water into baking soda until it reaches a paste-like consistency.  Do not scratch your skin.  Bathe less frequently, such as every other day.  Bathe in lukewarm water. Avoid using hot water. Medicines  Take or apply over-the-counter and prescription medicines only as told by your health care provider.  If you were prescribed an antibiotic medicine, take or apply your antibiotic as told by your health care provider. Do not stop using the antibiotic even if your condition starts to improve. General instructions  Keep all follow-up visits as told by your health care provider. This is  important.  Avoid the substance that caused your reaction. If you do not know what caused it, keep a journal to try to track what caused it. Write down: ? What you eat. ? What cosmetic products you use. ? What you drink. ? What you wear in the affected area.  This includes jewelry.  If you were given a dressing, take care of it as told by your health care provider. This includes when to change and remove it. Contact a health care provider if:  Your condition does not improve with treatment.  Your condition gets worse.  You have signs of infection such as swelling, tenderness, redness, soreness, or warmth in the affected area.  You have a fever.  You have new symptoms. Get help right away if:  You have a severe headache, neck pain, or neck stiffness.  You vomit.  You feel very sleepy.  You notice red streaks coming from the affected area.  Your bone or joint underneath the affected area becomes painful after the skin has healed.  The affected area turns darker.  You have difficulty breathing. This information is not intended to replace advice given to you by your health care provider. Make sure you discuss any questions you have with your health care provider. Document Released: 02/23/2000 Document Revised: 08/03/2015 Document Reviewed: 07/13/2014 Elsevier Interactive Patient Education  2018 [employer].    IF you received an x-ray today, you will receive an invoice from [facility]. Please contact [facility] at [phone] with questions or concerns regarding your invoice.   IF you received labwork today, you will receive an invoice from [facility]. Please contact [facility] at [phone] with questions or concerns regarding your invoice.   Our billing staff will not be able to assist you with questions regarding bills from these companies.  You will be contacted with the lab results as soon as they are available. The fastest way to get your results  is to activate your My Chart account. Instructions are located on the last page of this paperwork. If you have not heard from [place] regarding the results in 2 weeks, please contact this office.      I personally performed the services described in this documentation, which was scribed in my presence. The recorded information has been reviewed and considered for accuracy and completeness, addended by me as needed, and agree with information above.  Signed,   Merri Ray, MD Primary [facility] at [place].  04/22/17 11:15 PM

## 2017-04-22 ENCOUNTER — Encounter: Payer: Self-pay | Admitting: Family Medicine

## 2017-07-29 ENCOUNTER — Encounter: Payer: Self-pay | Admitting: Family Medicine

## 2017-07-29 ENCOUNTER — Ambulatory Visit (INDEPENDENT_AMBULATORY_CARE_PROVIDER_SITE_OTHER): Payer: Medicare Other | Admitting: Family Medicine

## 2017-07-29 VITALS — BP 128/86 | HR 70 | Temp 97.8°F | Ht 63.39 in | Wt 165.6 lb

## 2017-07-29 DIAGNOSIS — Z0000 Encounter for general adult medical examination without abnormal findings: Secondary | ICD-10-CM

## 2017-07-29 DIAGNOSIS — E785 Hyperlipidemia, unspecified: Secondary | ICD-10-CM | POA: Diagnosis not present

## 2017-07-29 DIAGNOSIS — N183 Chronic kidney disease, stage 3 unspecified: Secondary | ICD-10-CM | POA: Insufficient documentation

## 2017-07-29 DIAGNOSIS — R1031 Right lower quadrant pain: Secondary | ICD-10-CM | POA: Diagnosis not present

## 2017-07-29 DIAGNOSIS — R739 Hyperglycemia, unspecified: Secondary | ICD-10-CM | POA: Diagnosis not present

## 2017-07-29 DIAGNOSIS — K219 Gastro-esophageal reflux disease without esophagitis: Secondary | ICD-10-CM

## 2017-07-29 LAB — COMPREHENSIVE METABOLIC PANEL
ALBUMIN: 4 g/dL (ref 3.5–5.2)
ALK PHOS: 101 U/L (ref 39–117)
ALT: 16 U/L (ref 0–35)
AST: 16 U/L (ref 0–37)
BILIRUBIN TOTAL: 0.4 mg/dL (ref 0.2–1.2)
BUN: 21 mg/dL (ref 6–23)
CO2: 31 mEq/L (ref 19–32)
Calcium: 9.7 mg/dL (ref 8.4–10.5)
Chloride: 99 mEq/L (ref 96–112)
Creatinine, Ser: 1.04 mg/dL (ref 0.40–1.20)
GFR: 52.63 mL/min — AB (ref >60.00)
GLUCOSE: 120 mg/dL — AB (ref 70–99)
POTASSIUM: 4.2 meq/L (ref 3.5–5.1)
Sodium: 138 mEq/L (ref 135–145)
TOTAL PROTEIN: 7.2 g/dL (ref 6.0–8.3)

## 2017-07-29 LAB — CBC
HCT: 42.7 % (ref 36.0–46.0)
HEMOGLOBIN: 14.4 g/dL (ref 12.0–15.0)
MCHC: 33.7 g/dL (ref 30.0–36.0)
MCV: 90 fl (ref 78.0–100.0)
Platelets: 339 10*3/uL (ref 150.0–400.0)
RBC: 4.74 Mil/uL (ref 3.87–5.11)
RDW: 13 % (ref 11.5–15.5)
WBC: 7.1 10*3/uL (ref 4.0–10.5)

## 2017-07-29 LAB — LIPID PANEL
CHOLESTEROL: 209 mg/dL — AB (ref 0–200)
HDL: 81.7 mg/dL (ref >39.00)
LDL Cholesterol: 101 mg/dL — ABNORMAL HIGH (ref 0–99)
NONHDL: 127.28
Total CHOL/HDL Ratio: 3
Triglycerides: 131 mg/dL (ref 0.0–149.0)
VLDL: 26.2 mg/dL (ref 0.0–40.0)

## 2017-07-29 LAB — HEMOGLOBIN A1C: HEMOGLOBIN A1C: 6.3 % (ref 4.6–6.5)

## 2017-07-29 MED ORDER — DILTIAZEM HCL ER COATED BEADS 240 MG PO CP24: 240.0000 mg | ORAL_CAPSULE | Freq: Every day | ORAL | 3 refills | Status: DC

## 2017-07-29 MED ORDER — RANITIDINE HCL 150 MG PO CAPS: 150.0000 mg | ORAL_CAPSULE | Freq: Two times a day (BID) | ORAL | 3 refills | Status: DC

## 2017-07-29 MED ORDER — HYDROCHLOROTHIAZIDE 25 MG PO TABS: 25.0000 mg | ORAL_TABLET | Freq: Every day | ORAL | 3 refills | Status: DC

## 2017-07-29 MED ORDER — PANTOPRAZOLE SODIUM 20 MG PO TBEC: 20.0000 mg | DELAYED_RELEASE_TABLET | Freq: Every day | ORAL | 3 refills | Status: DC

## 2017-07-29 MED ORDER — CLOPIDOGREL BISULFATE 75 MG PO TABS: ORAL_TABLET | ORAL | 3 refills | Status: DC

## 2017-07-29 NOTE — Progress Notes (Addendum)
Phone: [phone]  Subjective:  Patient presents today for their annual physical. Chief complaint-noted.   See problem oriented charting- ROS- full  review of systems was completed and negative including No chest pain or shortness of breath. No headache or blurry vision. (per review of symptoms sheet)  The following were reviewed and entered/updated in epic: Past Medical History:  Diagnosis Date  . Adenomatous polyps   . Arthritis   . Cerebrovascular accident ([place])   . Cervical cancer ([place])    Cervical Ca-no radiation or chemo  . Diverticulosis   . Edema   . Effusion of ankle and foot joint   . GERD (gastroesophageal reflux disease)   . History of UTI   . Hypertension   . Observation for suspected cardiovascular disease   . Other abnormal blood chemistry   . Peptic ulcer disease   . Pyloric stenosis   . S/P total abdominal hysterectomy   . Special screening for malignant neoplasm of colon    Patient Active Problem List   Diagnosis Date Noted  . ACP (advance care planning) 12/17/2012    Priority: High  . Cerebrovascular accident [facility])     Priority: High  . Hyperlipidemia 06/28/2014    Priority: Medium  . Hyperglycemia 06/28/2014    Priority: Medium  . Essential hypertension 12/02/2006    Priority: Medium  . Routine health maintenance 08/25/2011    Priority: Low  . GERD 12/02/2006    Priority: Low   Past Surgical History:  Procedure Laterality Date  . ABDOMINAL HYSTERECTOMY     including cervix  . CATARACT EXTRACTION  2008   Dr Kathrin Penner  . cyst on right hand removed    . TEE WITHOUT CARDIOVERSION  11/20/2011   TEE with bubble study-PFO    Family History  Problem Relation Age of Onset  . Heart failure Mother   . Hypertension Mother   . Coronary artery disease Mother   . Hypertension Sister     Medications- reviewed and updated Current Outpatient Medications  Medication Sig Dispense Refill  . Bisacodyl (DULCOLAX PO) Take 1 tablet by mouth as  needed.    . clopidogrel (PLAVIX) 75 MG tablet TAKE (1) TABLET DAILY. 91 tablet 3  . diltiazem (CARDIZEM CD) 240 MG 24 hr capsule Take 1 capsule (240 mg total) by mouth daily. 91 capsule 3  . hydrochlorothiazide (HYDRODIURIL) 25 MG tablet Take 1 tablet (25 mg total) by mouth daily. 91 tablet 3  . Multiple Vitamins-Minerals (CENTRUM SILVER) tablet Take 1 tablet by mouth daily.      . ranitidine (ZANTAC) 150 MG capsule Take 1 capsule (150 mg total) by mouth 2 (two) times daily. 180 capsule 3  . triamcinolone cream (KENALOG) 0.1 % Apply 1 application topically 2 (two) times daily. 30 g 0   No current facility-administered medications for this visit.     Allergies-reviewed and updated No Known Allergies  Social History   Social History Narrative   HSG, [employer]. Married '50- widowed after 61 years.Lives alone in own home. Does housework but not yardwork      On the list [facility]   ACP/End of life: No CPR, no heroic/futile treatment, i.e. prolonged ICU care. HCPA sister [name] tele [phone].      Patient is retired from [employer] after 35 years   Right handed.   Caffeine two cups of coffee daily. Very Rare coke.      Hobbies: play golf, play bridge    Objective: BP 128/86 (BP Location: Left  Arm, Patient Position: Sitting, Cuff Size: Normal)   Pulse 70   Temp 97.8 F (36.6 C) (Oral)   Ht 5' 3.39" (1.61 m)   Wt 165 lb 9.6 oz (75.1 kg)   SpO2 95%   BMI 28.97 kg/m  Gen: NAD, resting comfortably HEENT: Mucous membranes are moist. Oropharynx normal Neck: no thyromegaly CV: RRR no murmurs rubs or gallops Lungs: CTAB no crackles, wheeze, rhonchi Abdomen: soft/nontender/nondistended/normal bowel sounds. No rebound or guarding.  Ext: 1+ edema Skin: warm, dry Neuro: grossly normal, moves all extremities, PERRLA MSK: no pain with IR and ER and flexion of the hip. Does have pain with abduction in flexed position into groin. No obvious groin bulge noted  today  Assessment/Plan:  84 y.o. female presenting for annual physical.  Health Maintenance counseling: 1. Anticipatory guidance: Patient counseled regarding regular dental exams -q6 months, eye exams - , wearing seatbelts.  2. Risk factor reduction:  Advised patient of need for regular exercise and diet rich and fruits and vegetables to reduce risk of heart attack and stroke. Exercise- playing once a week golf for most part, not walking much outside home- tries to be active indoors. Diet-overeats sweets. Weight stable Wt Readings from Last 3 Encounters:  07/29/17 165 lb 9.6 oz (75.1 kg)  04/21/17 164 lb 9.6 oz (74.7 kg)  04/03/17 166 lb 3.2 oz (75.4 kg)  3. Immunizations/screenings/ancillary studies- continues to decline all immunizations. Would need Td if cut/scrape 4. Cervical cancer screening- passed age based screening 5. Breast cancer screening-  Passed age based screening 6. Colon cancer screening - passed age based screening. Did have some rectal bleeding a few years ago and anoscopy showed hemorrhoids in settin gof constipation. Saw GI- no blleeding since that time 7. Skin cancer screening- declines any interventions. Would not want skin referral even if worrisome skin lesion.  Denies worrisome, changing, or new skin lesions.  8. Birth control/STD check- postmenopausal/monagomous  9. Osteoporosis screening at 51- declines DEXA- states wouldn't take medicine even if thin bones  Status of chronic or acute concerns   Right groin pain- feels like it catches. Sharp pain then goes away. Goes away with sitting down. Can get up to 10/10. Would not want surgery. Would like to see Dr. Paulla Fore- referred today  Patient remains on plavix with stroke history. Follows with Dr. Leonie Man  Blood pressure controlled on diltiazem 240mg  Xr, hctz 25mg   GERD GERD controlled on protonix 20mg . "Lets try zantac 150mg  also known as ranitidine instead of protonix for reflux for at least a week- if you continue  to get reflux at a week you can switch back"  Hyperlipidemia HLD- lipids elevated last year. Neurology has been ok with LDL under 100. We discussed pushing LDL below 70 with statin. Would definitely start one if LDL above 100- she really prefers not to take statin so may decline again this year   Hyperglycemia We will update a1c today. If remains at 6.5 would change thi sdiagnosis to diabetes. Likely start low dose metformin to help considering "sugar addiction"  CKD (chronic kidney disease), stage III (HCC) GFR in 40s. Update GFR/cr. She does not want to use statin for lipids unless absolutely have to. BP ok today. Avoid nsaids. Try to get off PPI  Future Appointments  Date Time Provider [place]  10/22/2017 10:00 AM Williemae Area, RN [facility]   Lab/Order associations: Right groin pain - Plan: Ambulatory referral to Sports Medicine  Gastroesophageal reflux disease without esophagitis  Hyperlipidemia, unspecified  hyperlipidemia type - Plan: CBC, Comprehensive metabolic panel, Lipid panel  Hyperglycemia - Plan: Hemoglobin A1c  Preventative health care - Plan: CBC, Comprehensive metabolic panel, Lipid panel, Hemoglobin A1c  Meds ordered this encounter  Medications  . clopidogrel (PLAVIX) 75 MG tablet    Sig: TAKE (1) TABLET DAILY.    Dispense:  91 tablet    Refill:  3  . diltiazem (CARDIZEM CD) 240 MG 24 hr capsule    Sig: Take 1 capsule (240 mg total) by mouth daily.    Dispense:  91 capsule    Refill:  3  . hydrochlorothiazide (HYDRODIURIL) 25 MG tablet    Sig: Take 1 tablet (25 mg total) by mouth daily.    Dispense:  91 tablet    Refill:  3  . DISCONTD: pantoprazole (PROTONIX) 20 MG tablet    Sig: Take 1 tablet (20 mg total) by mouth daily.    Dispense:  91 tablet    Refill:  3  . ranitidine (ZANTAC) 150 MG capsule    Sig: Take 1 capsule (150 mg total) by mouth 2 (two) times daily.    Dispense:  180 capsule    Refill:  3    Return precautions  advised.  Garret Reddish, MD

## 2017-07-29 NOTE — Assessment & Plan Note (Signed)
GFR in 40s. Update GFR/cr. She does not want to use statin for lipids unless absolutely have to. BP ok today. Avoid nsaids. Try to get off PPI

## 2017-07-29 NOTE — Assessment & Plan Note (Signed)
GERD controlled on protonix 20mg . "Lets try zantac 150mg  also known as ranitidine instead of protonix for reflux for at least a week- if you continue to get reflux at a week you can switch back"

## 2017-07-29 NOTE — Assessment & Plan Note (Signed)
We will update a1c today. If remains at 6.5 would change thi sdiagnosis to diabetes. Likely start low dose metformin to help considering "sugar addiction"

## 2017-07-29 NOTE — Patient Instructions (Addendum)
Lets try zantac 150mg  also known as ranitidine instead of protonix for reflux for at least a week- if you continue to get reflux at a week you can switch back  Schedule a visit with Dr. Paulla Fore before you go  Would love to see you in 6 months- definitely need to if a1c 6.5 or above. May start metformin in that case  If cholesterol above 100 would want to start at least once a week cholesterol medicine like atorvastatin  Please stop by lab before you go

## 2017-07-29 NOTE — Assessment & Plan Note (Addendum)
HLD- lipids elevated last year. Neurology has been ok with LDL under 100. We discussed pushing LDL below 70 with statin. Would definitely start one if LDL above 100- she really prefers not to take statin so may decline again this year

## 2017-07-29 NOTE — Progress Notes (Signed)
Your CBC was normal (blood counts, infection fighting cells, platelets). Your CMET was normal/stable (kidney, liver, and electrolytes, blood sugar).  You continue to have some mild decreases in your kidney function-this has not worsened over the last 2 years.  Your blood sugar shows you are at risk for diabetes. At risk for diabetes with hemoglobin a1c of 6.3 down slightly from 6.5 last check (at risk from 5.7-6.4). Healthy eating, regular exercise, weight loss advised. Above 6 we could consider metformin to help prevent diabetes-if you would like to start this to help prevent diabetes we can start 500 mg once daily with breakfast-team you can call in 180 days if she agrees. Your cholesterol remains elevated with bad cholesterol over 100.  I would recommend taking atorvastatin 20 mg once a week-team you can give her 6 months of this if she agrees.  At time of visit she seems strongly opposed though.

## 2017-07-30 ENCOUNTER — Other Ambulatory Visit: Payer: Self-pay

## 2017-07-30 MED ORDER — ATORVASTATIN CALCIUM 20 MG PO TABS: 20.0000 mg | ORAL_TABLET | ORAL | 1 refills | Status: DC

## 2017-08-01 ENCOUNTER — Ambulatory Visit: Payer: Medicare Other | Admitting: Sports Medicine

## 2017-08-01 ENCOUNTER — Ambulatory Visit: Payer: Self-pay

## 2017-08-01 ENCOUNTER — Encounter: Payer: Self-pay | Admitting: Sports Medicine

## 2017-08-01 ENCOUNTER — Ambulatory Visit (INDEPENDENT_AMBULATORY_CARE_PROVIDER_SITE_OTHER): Payer: Medicare Other

## 2017-08-01 DIAGNOSIS — R1031 Right lower quadrant pain: Secondary | ICD-10-CM

## 2017-08-01 DIAGNOSIS — M1611 Unilateral primary osteoarthritis, right hip: Secondary | ICD-10-CM

## 2017-08-01 NOTE — Procedures (Signed)
PROCEDURE NOTE:  Ultrasound Guided: Injection: Right hip Images were obtained and interpreted by myself, Teresa Coombs, DO  Images have been saved and stored to PACS system. Images obtained on: GE S7 Ultrasound machine    ULTRASOUND FINDINGS:  Moderate degenerative change  DESCRIPTION OF PROCEDURE:  The patient's clinical condition is marked by substantial pain and/or significant functional disability. Other conservative therapy has not provided relief, is contraindicated, or not appropriate. There is a reasonable likelihood that injection will significantly improve the patient's pain and/or functional impairment.   After discussing the risks, benefits and expected outcomes of the injection and all questions were reviewed and answered, the patient wished to undergo the above named procedure.  Verbal consent was obtained.  The ultrasound was used to identify the target structure and adjacent neurovascular structures. The skin was then prepped in sterile fashion and the target structure was injected under direct visualization using sterile technique as below:  PREP: Alcohol and Ethel Chloride APPROACH: direct, stopcock technique, 22g 3.5 in. INJECTATE: 5 cc 1% lidocaine, 2 cc 0.5% Marcaine and 2 cc 40mg /mL DepoMedrol ASPIRATE: None DRESSING: Band-Aid  Post procedural instructions including recommending icing and warning signs for infection were reviewed.    This procedure was well tolerated and there were no complications.   IMPRESSION: Succesful Ultrasound Guided: Injection

## 2017-08-01 NOTE — Patient Instructions (Signed)

## 2017-08-01 NOTE — Progress Notes (Signed)
  [name], [place] at [facility] - 84 y.o. female MRN [id number]  Date of birth: Aug 25, 1922  Visit Date: 08/01/2017  PCP: Marin Olp, MD   Referred by: Marin Olp, MD  Scribe for today's visit: [name], CMA     SUBJECTIVE:  [name] is here for Initial Assessment (R groin pain) .  Referred by: Dr. Garret Reddish Her R groin pain symptoms INITIALLY: Began years ago and MOI is unknown. Described as severe sharp catching, nonradiating Worsened with walking, golfing, exercising.  Improved with sitting down Additional associated symptoms include: She denies hip pain, LBP. Pain comes and goes. Pain is severe when it "catches". Pain resolved quickly after sitting down. She will feel a catch sometimes at night when she is lying in bed.     At this time symptoms are worsening compared to onset, seems to be catching more often.  She has tried any medication or other modalities because pain resolves when lying down.    ROS Reports night time disturbances. Denies fevers, chills, or night sweats. Denies unexplained weight loss. Reports personal history of cancer (cervical [facility]). Reports changes in bowel or bladder habits (constipation). Denies recent unreported falls. Denies new or worsening dyspnea or wheezing. Denies headaches or dizziness.  Denies numbness, tingling or weakness  In the extremities.  Denies dizziness or presyncopal episodes Reports lower extremity edema    HISTORY & PERTINENT PRIOR DATA:  Prior History reviewed and updated per electronic medical record.  Significant/pertinent history, findings, studies include:  reports that she quit smoking about 69 years ago. She has never used smokeless tobacco. Recent Labs    07/29/17 0937  HGBA1C 6.3   No specialty comments available. No problems updated.  OBJECTIVE:  VS:  HT:    WT:   BMI:     BP:    HR: bpm  TEMP: ( )  RESP:    PHYSICAL EXAM: Constitutional: WDWN, Non-toxic appearing. Psychiatric: Alert & appropriately interactive.  Not depressed or anxious appearing. Respiratory: No increased work of breathing.  Trachea Midline Eyes: Pupils are equal.  EOM intact without nystagmus.  No scleral icterus  Vascular Exam: warm to touch no edema  upper and lower extremity neuro exam: unremarkable  MSK Exam: Right hip has effectively no internal and external rotation and pain with Stinchfield testing.   ASSESSMENT & PLAN:   1. Right groin pain   2. Primary osteoarthritis of right hip     PLAN: Ultrasound-guided hip injection today after reviewing x-rays with patient did reveal significant degenerative changes.  May end up being a surgical candidate but will consider repeat injections in 3 to 4 months.  Follow-up: Return in about 6 weeks (around 09/12/2017).      Please see additional documentation for Objective, Assessment and Plan sections. Pertinent additional documentation may be included in corresponding procedure notes, imaging studies, problem based documentation and patient instructions. Please see these sections of the encounter for additional information regarding this visit.  CMA/ATC served as Education administrator during this visit. History, Physical, and Plan performed by medical provider. Documentation and orders reviewed and attested to.      Gerda Diss, [place] [facility] Physician

## 2017-08-26 ENCOUNTER — Telehealth: Payer: Self-pay | Admitting: Family Medicine

## 2017-08-26 NOTE — Telephone Encounter (Signed)
Patient scheduled Friday, August 29, 2017 at 4:15 to come in and complete paperwork

## 2017-08-26 NOTE — Telephone Encounter (Signed)
See note.   Copied from [place] [id number]. Topic: General - Other >> Aug 26, 2017  3:03 PM Oneta Rack wrote: Relation to pt: self  Call back number: [phone]    Reason for call:  Patient states [facility] faxed over residential admission form several times to [phone] and will fax over form again for PCP to fill out and fax back to [facility] at [phone]. Patient seeking residential admission, please note when form has been received.

## 2017-08-29 ENCOUNTER — Encounter: Payer: Self-pay | Admitting: Family Medicine

## 2017-08-29 ENCOUNTER — Ambulatory Visit: Payer: Medicare Other | Admitting: Family Medicine

## 2017-08-29 VITALS — BP 136/78 | HR 67 | Temp 98.3°F | Ht 63.0 in | Wt 163.0 lb

## 2017-08-29 DIAGNOSIS — K219 Gastro-esophageal reflux disease without esophagitis: Secondary | ICD-10-CM | POA: Diagnosis not present

## 2017-08-29 DIAGNOSIS — I10 Essential (primary) hypertension: Secondary | ICD-10-CM | POA: Diagnosis not present

## 2017-08-29 NOTE — Assessment & Plan Note (Signed)
S: patient trialed zantac 150mg  BID but was unable to tolerate- breakthrough reflux- back on protonix 20mg .  A/P: continue PPI given zantac failure.

## 2017-08-29 NOTE — Assessment & Plan Note (Signed)
S: controlled on Diltiazem 240 XL, hctz 25mg  reasonably- with prior stroke would prefer systolic under 462 but orthostatic symptoms have limited this effort in past BP Readings from Last 3 Encounters:  08/29/17 136/78  07/29/17 128/86  04/21/17 134/60  A/P: We discussed blood pressure goal of <140/90. Continue current meds

## 2017-08-29 NOTE — Patient Instructions (Addendum)
I would also like for you to sign up for an annual wellness visit with one of our nurses, Cassie or Manuela Schwartz, who both specialize in the annual wellness visit. This is a free benefit under medicare that may help [place] find additional ways to help you. Some highlights are reviewing medications, lifestyle, and doing a dementia screen.  Please check with your pharmacy to see if they have the shingrix vaccine. If they do- please get this immunization and update [place] by phone call or mychart with dates you receive the vaccine  _____________________________________________________ Continue to cut back on those sweets to help avoid developing diabetes. We could also use metformin to help prevent diabetes if you ever wanted in the future

## 2017-08-29 NOTE — Progress Notes (Signed)
Subjective:  [name] is a 84 y.o. year old very pleasant female patient who presents for/with See problem oriented charting ROS- No chest pain or shortness of breath. No headache or blurry vision.    Past Medical History-  Patient Active Problem List   Diagnosis Date Noted  . ACP (advance care planning) 12/17/2012    Priority: High  . History of CVA in adulthood     Priority: High  . CKD (chronic kidney disease), stage III ([place]) 07/29/2017    Priority: Medium  . Hyperlipidemia 06/28/2014    Priority: Medium  . Hyperglycemia 06/28/2014    Priority: Medium  . Essential hypertension 12/02/2006    Priority: Medium  . Routine health maintenance 08/25/2011    Priority: Low  . GERD 12/02/2006    Priority: Low    Medications- reviewed and updated Current Outpatient Medications  Medication Sig Dispense Refill  . atorvastatin (LIPITOR) 20 MG tablet Take 1 tablet (20 mg total) by mouth once a week. 12 tablet 1  . Bisacodyl (DULCOLAX PO) Take 1 tablet by mouth as needed.    . clopidogrel (PLAVIX) 75 MG tablet TAKE (1) TABLET DAILY. 91 tablet 3  . diltiazem (CARDIZEM CD) 240 MG 24 hr capsule Take 1 capsule (240 mg total) by mouth daily. 91 capsule 3  . hydrochlorothiazide (HYDRODIURIL) 25 MG tablet Take 1 tablet (25 mg total) by mouth daily. 91 tablet 3  . Multiple Vitamins-Minerals (CENTRUM SILVER) tablet Take 1 tablet by mouth daily.      . pantoprazole (PROTONIX) 20 MG tablet Take 20 mg by mouth daily.    Marland Kitchen triamcinolone cream (KENALOG) 0.1 % Apply 1 application topically 2 (two) times daily. 30 g 0   No current facility-administered medications for this visit.     Objective: BP 136/78 (BP Location: Left Arm, Patient Position: Sitting, Cuff Size: Large)   Pulse 67   Temp 98.3 F (36.8 C) (Oral)   Ht 5\' 3"  (1.6 m)   Wt 163 lb (73.9 kg)   SpO2 97%   BMI 28.87 kg/m  Gen: NAD, resting comfortably, appears younger than stated age CV: RRR no murmurs rubs or gallops Lungs: CTAB  no crackles, wheeze, rhonchi Abdomen: soft/nontender/nondistended Ext: no edema Skin: warm, dry  Assessment/Plan:  Other notes: 1.[facility]- will be in [place] at [place]- can see the college. Filled out [facility] form today and printed off last few notes per request.  2. Has follow up planned with dr Paulla Fore- injection was not as helpful as she had hoped  GERD S: patient trialed zantac 150mg  BID but was unable to tolerate- breakthrough reflux- back on protonix 20mg .  A/P: continue PPI given zantac failure.   Essential hypertension S: controlled on Diltiazem 240 XL, hctz 25mg  reasonably- with prior stroke would prefer systolic under 622 but orthostatic symptoms have limited this effort in past BP Readings from Last 3 Encounters:  08/29/17 136/78  07/29/17 128/86  04/21/17 134/60  A/P: We discussed blood pressure goal of <140/90. Continue current meds     Future Appointments  Date Time Provider [place]  09/12/2017 10:00 AM Gerda Diss, DO [facility]  10/22/2017 10:00 AM Williemae Area, RN [facility]  01/28/2018  9:00 AM Yong Channel Brayton Mars, MD [facility]   Return precautions advised.  Garret Reddish, MD

## 2017-09-12 ENCOUNTER — Ambulatory Visit: Payer: Medicare Other | Admitting: Sports Medicine

## 2017-09-16 ENCOUNTER — Ambulatory Visit: Payer: Medicare Other | Admitting: Sports Medicine

## 2017-09-16 ENCOUNTER — Encounter: Payer: Self-pay | Admitting: Sports Medicine

## 2017-09-16 VITALS — BP 130/74 | HR 72 | Wt 163.0 lb

## 2017-09-16 DIAGNOSIS — R1031 Right lower quadrant pain: Secondary | ICD-10-CM

## 2017-09-16 DIAGNOSIS — M1611 Unilateral primary osteoarthritis, right hip: Secondary | ICD-10-CM

## 2017-09-16 NOTE — Progress Notes (Signed)
[name]. [name], [place] at [facility] - 84 y.o. female MRN [id number]  Date of birth: 1922-04-08  Visit Date: 09/16/2017  PCP: [name] Olp, MD   Referred by: [name] Olp, MD  Scribe(s) for today's visit: [name], CMA  SUBJECTIVE:  [name] is here for Follow-up (R hip/groin pain)  08/01/2017: Her R groin pain symptoms INITIALLY: Began years ago and MOI is unknown. Described as severe sharp catching, nonradiating Worsened with walking, golfing, exercising.  Improved with sitting down Additional associated symptoms include: She denies hip pain, LBP. Pain comes and goes. Pain is severe when it "catches". Pain resolved quickly after sitting down. She will feel a catch sometimes at night when she is lying in bed.    At this time symptoms are worsening compared to onset, seems to be catching more often.  She has tried any medication or other modalities because pain resolves when lying down.    09/16/2017: Compared to the last office visit, her previously described symptoms were worse for two weeks. After that she began to have a ripping in right side buttock with pain into right back of thigh.  felling in Current symptoms are moderate & are non radiating feels like "CatchK" She has only been trying alive for pain. No other therapies.  NSAID  Taking two a day as need for pain provides some help.   REVIEW OF SYSTEMS: Denies night time disturbances. Denies fevers, chills, or night sweats. Denies unexplained weight loss. Denies personal history of cancer. Denies changes in bowel or bladder habits. Denies recent unreported falls. Denies new or worsening dyspnea or wheezing. Denies headaches or dizziness.  Denies numbness, tingling or weakness  In the extremities.  Denies dizziness or presyncopal episodes Denies lower extremity edema    HISTORY:  Prior history reviewed and updated per  electronic medical record.  Social History   Occupational History  . Occupation: Retired    Fish farm manager: RETIRED  Tobacco Use  . Smoking status: Former Smoker    Last attempt to quit: 03/11/1948    Years since quitting: 69.7  . Smokeless tobacco: Never Used  Substance and Sexual Activity  . Alcohol use: Yes    Alcohol/week: 0.0 standard drinks    Comment: Occasional wine, not very often  . Drug use: No  . Sexual activity: Not Currently   Social History   Social History Narrative   HSG, [employer]. Married '50- widowed after 19 years.Lives alone in own home. Does housework but not yardwork      On the list [facility]   ACP/End of life: No CPR, no heroic/futile treatment, i.e. prolonged ICU care. HCPA sister [name] tele [phone].      Patient is retired from [employer] after 35 years   Right handed.   Caffeine two cups of coffee daily. Very Rare coke.      Hobbies: play golf, play bridge     DATA OBTAINED & REVIEWED:   Recent Labs    07/29/17 0937  HGBA1C 6.3   X-Rays - 08/01/17 -severe OA of the hip 08/01/2017 intra-articular injection right hip -    OBJECTIVE:  VS:  HT:    WT:163 lb (73.9 kg)  BMI:28.88    BP:130/74  HR:72bpm  TEMP: ( )  RESP:95 %   PHYSICAL EXAM: CONSTITUTIONAL: Well-developed, Well-nourished and In no acute distress PSYCHIATRIC: Alert & appropriately interactive. and Not depressed or anxious appearing. RESPIRATORY:  No increased work of breathing and Trachea Midline EYES: Pupils are equal., EOM intact without nystagmus. and No scleral icterus.  VASCULAR EXAM: Warm and well perfused NEURO: unremarkable  MSK Exam: Right HIP Exam: Well aligned, no significant deformity. No overlying skin changes. Mild pain over the gluteal musculature and greater trochanter this is minimal.   Log Roll: positive, mild pain FADIR: positive, mild pain FABER: positive, mild pain Stinchfield testing: positive, mild pain Strength: 5/5 Axial loading  produces: Mild pain and No crepitation  ASSESSMENT   1. Right groin pain   2. Primary osteoarthritis of right hip     PLAN:  Pertinent additional documentation may be included in corresponding procedure notes, imaging studies, problem based documentation and patient instructions.  Procedures:  . None  Medications:  No orders of the defined types were placed in this encounter.  Discussion/Instructions: No problem-specific Assessment & Plan notes found for this encounter.  . Good improvement in internal and external range of motion.  Pain is improved.  . Will consider repeat injection and follow-up as needed.  She will call when she is ready for this.  Can consider referral to surgery if any worsening features.   . Discussed red flag symptoms that warrant earlier emergent evaluation and patient voices understanding. . Activity modifications and the importance of avoiding exacerbating activities (limiting pain to no more than a 4 / 10 during or following activity) recommended and discussed.  Follow-up:  . Return if symptoms worsen or fail to improve.       [name] served as Education administrator during this visit. History, Physical, and Plan performed by medical provider. Documentation and orders reviewed and attested to.      [name], [place] [facility] Physician

## 2017-10-22 ENCOUNTER — Ambulatory Visit: Payer: Medicare Other

## 2017-10-22 ENCOUNTER — Ambulatory Visit (INDEPENDENT_AMBULATORY_CARE_PROVIDER_SITE_OTHER): Payer: Medicare Other

## 2017-10-22 VITALS — BP 138/64 | HR 66 | Ht 63.0 in | Wt 161.8 lb

## 2017-10-22 DIAGNOSIS — Z0000 Encounter for general adult medical examination without abnormal findings: Secondary | ICD-10-CM

## 2017-10-22 MED ORDER — ATORVASTATIN CALCIUM 20 MG PO TABS: 20.0000 mg | ORAL_TABLET | ORAL | 3 refills | Status: DC

## 2017-10-22 NOTE — Progress Notes (Signed)
Subjective:   [name] is a 84 y.o. female who presents for Medicare Annual (Subsequent) preventive examination.  Review of Systems:  No ROS.  Medicare Wellness Visit. Additional risk factors are reflected in the social history. Cardiac Risk Factors include: advanced age (>83men, >73 women) Patient currently resides in a single story ranch home. She is a widow. She is preparing to move next week into an apartment at [facility]. She is excited about her upcoming move. She has been going through all of her belongings as she will be downsizing. She has found several sweet surprises she had forgotten about. (love letters, receipts from when she was in college, her wedding registry from 1950). Patient plays bridge every Wednesday afternoon. She works crosswords puzzles.     Patient goes to bed around 11:30. She get up around 7:30am. She gets up 2-3 times a night to go to the bathroom. She feels rested when she wakes up. Objective:     Vitals: BP 138/64 (BP Location: Left Arm, Patient Position: Sitting, Cuff Size: Large)   Pulse 66   Ht 5\' 3"  (1.6 m)   Wt 161 lb 12.8 oz (73.4 kg)   SpO2 94%   BMI 28.66 kg/m   Body mass index is 28.66 kg/m.  Advanced Directives 10/22/2017 05/11/2014 11/18/2011 11/18/2011  Does Patient Have a Medical Advance Directive? Yes - Patient would like information;Patient has advance directive, copy not in chart Patient has advance directive, copy not in chart  Type of Advance Directive Living will [place];Living will Living will Living will  Does patient want to make changes to medical advance directive? No - Patient declined - - -  Copy of [place] in Chart? - No - copy requested Copy requested from family Copy requested from family  Would patient like information on creating a medical advance directive? - - Advance directive packet given -  Pre-existing out of facility DNR order (yellow form or pink MOST form) - - No No     Tobacco Social History   Tobacco Use  Smoking Status Former Smoker  . Last attempt to quit: 03/11/1948  . Years since quitting: 69.6  Smokeless Tobacco Never Used     Counseling given: Not Answered   Past Medical History:  Diagnosis Date  . Adenomatous polyps   . Arthritis   . Cerebrovascular accident ([place])   . Cervical cancer ([place])    Cervical Ca-no radiation or chemo  . Diverticulosis   . Edema   . Effusion of ankle and foot joint   . GERD (gastroesophageal reflux disease)   . History of UTI   . Hypertension   . Observation for suspected cardiovascular disease   . Other abnormal blood chemistry   . Peptic ulcer disease   . Pyloric stenosis   . S/P total abdominal hysterectomy   . Special screening for malignant neoplasm of colon    Past Surgical History:  Procedure Laterality Date  . ABDOMINAL HYSTERECTOMY     including cervix  . CATARACT EXTRACTION  2008   Dr Kathrin Penner  . cyst on right hand removed    . TEE WITHOUT CARDIOVERSION  11/20/2011   TEE with bubble study-PFO   Family History  Problem Relation Age of Onset  . Heart failure Mother   . Hypertension Mother   . Coronary artery disease Mother   . Hypertension Sister   . Stroke Brother   . Hypertension Maternal Grandmother   . Stroke Maternal  Grandmother   . Hypertension Maternal Grandfather    Social History   Socioeconomic History  . Marital status: Widowed    Spouse name: Not on file  . Number of children: 0  . Years of education: College  . Highest education level: Not on file  Occupational History  . Occupation: Retired    Fish farm manager: RETIRED  Social Needs  . Financial resource strain: Not on file  . Food insecurity:    Worry: Not on file    Inability: Not on file  . Transportation needs:    Medical: Not on file    Non-medical: Not on file  Tobacco Use  . Smoking status: Former Smoker    Last attempt to quit: 03/11/1948    Years since quitting: 69.6  . Smokeless tobacco: Never  Used  Substance and Sexual Activity  . Alcohol use: Yes    Alcohol/week: 0.0 standard drinks    Comment: Occasional wine, not very often  . Drug use: No  . Sexual activity: Not Currently  Lifestyle  . Physical activity:    Days per week: Not on file    Minutes per session: Not on file  . Stress: Not on file  Relationships  . Social connections:    Talks on phone: Not on file    Gets together: Not on file    Attends religious service: Not on file    Active member of club or organization: Not on file    Attends meetings of clubs or organizations: Not on file    Relationship status: Not on file  Other Topics Concern  . Not on file  Social History Narrative   HSG, [employer]. Married '50- widowed after 53 years.Lives alone in own home. Does housework but not yardwork      On the list [facility]   ACP/End of life: No CPR, no heroic/futile treatment, i.e. prolonged ICU care. HCPA sister [name] tele [phone].      Patient is retired from [employer] after 35 years   Right handed.   Caffeine two cups of coffee daily. Very Rare coke.      Hobbies: play golf, play bridge    Outpatient Encounter Medications as of 10/22/2017  Medication Sig  . atorvastatin (LIPITOR) 20 MG tablet Take 1 tablet (20 mg total) by mouth once a week.  . Bisacodyl (DULCOLAX PO) Take 1 tablet by mouth as needed.  . clopidogrel (PLAVIX) 75 MG tablet TAKE (1) TABLET DAILY.  Marland Kitchen diltiazem (CARDIZEM CD) 240 MG 24 hr capsule Take 1 capsule (240 mg total) by mouth daily.  . hydrochlorothiazide (HYDRODIURIL) 25 MG tablet Take 1 tablet (25 mg total) by mouth daily.  . Multiple Vitamins-Minerals (CENTRUM SILVER) tablet Take 1 tablet by mouth daily.    . pantoprazole (PROTONIX) 20 MG tablet Take 20 mg by mouth daily.  . [DISCONTINUED] atorvastatin (LIPITOR) 20 MG tablet Take 1 tablet (20 mg total) by mouth once a week.  . [DISCONTINUED] triamcinolone cream (KENALOG) 0.1 % Apply 1 application topically 2 (two) times  daily. (Patient not taking: Reported on 10/22/2017)   No facility-administered encounter medications on file as of 10/22/2017.     Activities of Daily Living In your present state of health, do you have any difficulty performing the following activities: 10/22/2017  Hearing? N  Vision? N  Difficulty concentrating or making decisions? N  Walking or climbing stairs? N  Dressing or bathing? N  Doing errands, shopping? N  Preparing Food and eating ? N  Using the Toilet? N  In the past six months, have you accidently leaked urine? N  Do you have problems with loss of bowel control? N  Managing your Medications? N  Managing your Finances? N  Housekeeping or managing your Housekeeping? N  Some recent data might be hidden    Patient Care Team: Marin Olp, MD as PCP - General (Family Medicine)    Assessment:   This is a routine wellness examination for [employer].  Exercise Activities and Dietary recommendations Current Exercise Habits: The patient does not participate in regular exercise at present, Exercise limited by: None identified  Breakfast: Cereal, peaches, coffee with a little bit of cream, Glass of water  Lunch: Cheese Crackers and a Dr. Malachi Bonds, [employer] sandwich  Dinner: nuts and whatever is around to snack on. Lunch is normally her large meal of the day  Plans on eating full 3 meals a day when she gets to [place]    . Increase physical activity     Patient is moving to [facility] and they have a gym there. She is thinking about starting to work out when she moves there       Ramirez[place]Perez  10/22/2017 04/21/2017 04/03/2017 07/26/2016 05/20/2016  Falls in the past year? No No No No No   Depression Screen PHQ 2/9 Scores 10/22/2017 04/21/2017 04/03/2017 07/26/2016  PHQ - 2 Score 0 0 0 0     Cognitive Function     6CIT Screen 10/22/2017  What Year? 0 points  What month? 0 points  What time? 0 points  Count back from 20 0 points  Months in  reverse 0 points     There is no immunization history on file for this patient.    Screening Tests Health Maintenance  Topic Date Due  . INFLUENZA VACCINE  04/18/2018 (Originally 10/09/2017)  . DEXA SCAN  06/27/2024 (Originally 02/25/1990)  . PNA vac Low Risk Adult (1 of 2 - PCV13) 06/27/2024 (Originally 02/25/1990)  . TETANUS/TDAP  12/17/2018         Plan:    Follow Up with PCP as advised   I have personally reviewed and noted the following in the patient's chart:   . Medical and social history . Use of alcohol, tobacco or illicit drugs  . Current medications and supplements . Functional ability and status . Nutritional status . Physical activity . Advanced directives . List of other physicians . Vitals . Screenings to include cognitive, depression, and falls . Referrals and appointments  In addition, I have reviewed and discussed with patient certain preventive protocols, quality metrics, and best practice recommendations. A written personalized care plan for preventive services as well as general preventive health recommendations were provided to patient.     [place], [place]  2/99/3716

## 2017-10-22 NOTE — Progress Notes (Signed)
PCP notes:Last OV was 08/29/17   Health maintenance: Up to Date   Abnormal screenings: None   Patient concerns: None   Nurse concerns: None   Next PCP appt: 01/28/2018

## 2017-10-22 NOTE — Patient Instructions (Signed)
[name] , Thank you for taking time to come for your Medicare Wellness Visit. I appreciate your ongoing commitment to your health goals. Please review the following plan we discussed and let me know if I can assist you in the future.   These are the goals we discussed: Goals    . Increase physical activity     Patient is moving to [facility] and they have a gym there. She is thinking about starting to work out when she moves there       This is a list of the screening recommended for you and due dates:  Health Maintenance  Topic Date Due  . Flu Shot  04/18/2018*  . DEXA scan (bone density measurement)  06/27/2024*  . Pneumonia vaccines (1 of 2 - PCV13) 06/27/2024*  . Tetanus Vaccine  12/17/2018  *Topic was postponed. The date shown is not the original due date.   Preventive Care for Adults  A healthy lifestyle and preventive care can promote health and wellness. Preventive health guidelines for adults include the following key practices.  . A routine yearly physical is a good way to check with your health care provider about your health and preventive screening. It is a chance to share any concerns and updates on your health and to receive a thorough exam.  . Visit your dentist for a routine exam and preventive care every 6 months. Brush your teeth twice a day and floss once a day. Good oral hygiene prevents tooth decay and gum disease.  . The frequency of eye exams is based on your age, health, family medical history, use  of contact lenses, and other factors. Follow your health care provider's recommendations for frequency of eye exams.  . Eat a healthy diet. Foods like vegetables, fruits, whole grains, low-fat dairy products, and lean protein foods contain the nutrients you need without too many calories. Decrease your intake of foods high in solid fats, added sugars, and salt. Eat the right amount of calories for you. Get information about a proper diet from your health care  provider, if necessary.  . Regular physical exercise is one of the most important things you can do for your health. Most adults should get at least 150 minutes of moderate-intensity exercise (any activity that increases your heart rate and causes you to sweat) each week. In addition, most adults need muscle-strengthening exercises on 2 or more days a week.  Silver Sneakers may be a benefit available to you. To determine eligibility, you may visit the website: www.silversneakers.com or contact program at 819-133-5402 Mon-Fri between 8AM-8PM.   . Maintain a healthy weight. The body mass index (BMI) is a screening tool to identify possible weight problems. It provides an estimate of body fat based on height and weight. Your health care provider can find your BMI and can help you achieve or maintain a healthy weight.   For adults 20 years and older: ? A BMI below 18.5 is considered underweight. ? A BMI of 18.5 to 24.9 is normal. ? A BMI of 25 to 29.9 is considered overweight. ? A BMI of 30 and above is considered obese.   . Maintain normal blood lipids and cholesterol levels by exercising and minimizing your intake of saturated fat. Eat a balanced diet with plenty of fruit and vegetables. Blood tests for lipids and cholesterol should begin at age 52 and be repeated every 5 years. If your lipid or cholesterol levels are high, you are over 50, or you  are at high risk for heart disease, you may need your cholesterol levels checked more frequently. Ongoing high lipid and cholesterol levels should be treated with medicines if diet and exercise are not working.  . If you smoke, find out from your health care provider how to quit. If you do not use tobacco, please do not start.  . If you choose to drink alcohol, please do not consume more than 2 drinks per day. One drink is considered to be 12 ounces (355 mL) of beer, 5 ounces (148 mL) of wine, or 1.5 ounces (44 mL) of liquor.  . If you are 70-79 years  old, ask your health care provider if you should take aspirin to prevent strokes.  . Use sunscreen. Apply sunscreen liberally and repeatedly throughout the day. You should seek shade when your shadow is shorter than you. Protect yourself by wearing long sleeves, pants, a wide-brimmed hat, and sunglasses year round, whenever you are outdoors.  . Once a month, do a whole body skin exam, using a mirror to look at the skin on your back. Tell your health care provider of new moles, moles that have irregular borders, moles that are larger than a pencil eraser, or moles that have changed in shape or color.

## 2017-10-23 NOTE — Progress Notes (Signed)
I have personally reviewed the Medicare Annual Wellness questionnaire and have noted 1. The patient's medical and social history 2. Their use of alcohol, tobacco or illicit drugs 3. Their current medications and supplements 4. The patient's functional ability including ADL's, fall risks, home safety risks and hearing or visual impairment. 5. Diet and physical activities 6. Evidence for depression or mood disorders 7. Reviewed Updated provider list, see scanned forms and [facility] Snapshot.  8. Advanced directives reviewed.  The patients weight, height, BMI and visual acuity have been recorded in the chart I have made referrals, counseling and provided education to the patient based review of the above and I have provided the pt with a written personalized care plan for preventive services.  I have provided the patient with a copy of your personalized plan for preventive services. Instructed to take the time to review along with their updated medication list.  Briscoe Deutscher, DO

## 2018-01-28 ENCOUNTER — Ambulatory Visit: Payer: Medicare Other | Admitting: Family Medicine

## 2018-01-28 ENCOUNTER — Encounter: Payer: Self-pay | Admitting: Family Medicine

## 2018-01-28 VITALS — BP 138/60 | HR 68 | Temp 98.4°F | Ht 63.0 in | Wt 164.6 lb

## 2018-01-28 DIAGNOSIS — R739 Hyperglycemia, unspecified: Secondary | ICD-10-CM

## 2018-01-28 DIAGNOSIS — N183 Chronic kidney disease, stage 3 unspecified: Secondary | ICD-10-CM

## 2018-01-28 DIAGNOSIS — I10 Essential (primary) hypertension: Secondary | ICD-10-CM

## 2018-01-28 DIAGNOSIS — E785 Hyperlipidemia, unspecified: Secondary | ICD-10-CM | POA: Diagnosis not present

## 2018-01-28 DIAGNOSIS — Z79899 Other long term (current) drug therapy: Secondary | ICD-10-CM

## 2018-01-28 LAB — COMPREHENSIVE METABOLIC PANEL
ALT: 15 U/L (ref 0–35)
AST: 15 U/L (ref 0–37)
Albumin: 4.2 g/dL (ref 3.5–5.2)
Alkaline Phosphatase: 111 U/L (ref 39–117)
BUN: 18 mg/dL (ref 6–23)
CALCIUM: 9.6 mg/dL (ref 8.4–10.5)
CHLORIDE: 99 meq/L (ref 96–112)
CO2: 27 meq/L (ref 19–32)
Creatinine, Ser: 1.06 mg/dL (ref 0.40–1.20)
GFR: 51.43 mL/min — AB (ref >60.00)
GLUCOSE: 131 mg/dL — AB (ref 70–99)
Potassium: 3.7 mEq/L (ref 3.5–5.1)
Sodium: 139 mEq/L (ref 135–145)
Total Bilirubin: 0.6 mg/dL (ref 0.2–1.2)
Total Protein: 7.2 g/dL (ref 6.0–8.3)

## 2018-01-28 LAB — HEMOGLOBIN A1C: HEMOGLOBIN A1C: 6.4 % (ref 4.6–6.5)

## 2018-01-28 LAB — LDL CHOLESTEROL, DIRECT: Direct LDL: 69 mg/dL

## 2018-01-28 LAB — VITAMIN B12: VITAMIN B 12: 792 pg/mL (ref 211–911)

## 2018-01-28 MED ORDER — ATORVASTATIN CALCIUM 20 MG PO TABS: 20.0000 mg | ORAL_TABLET | ORAL | 3 refills | Status: DC

## 2018-01-28 NOTE — Assessment & Plan Note (Signed)
S: she loves sugars. Has had increased DM risk- if had another a1c 6.5 or above would diagnose with diabetes Lab Results  Component Value Date   HGBA1C 6.3 07/29/2017  A/P: update a1c- hoping remains under 6.5

## 2018-01-28 NOTE — Assessment & Plan Note (Addendum)
S: Patient with chronic kidney disease stage III-blood pressure control-hopefully blood pressure with improved control A/P: Update bmet today-hopefully stable. If had any worsening of filtration rate- could consider change to amlodipine which would be easier on kidneys - does have some edema though so might not be best idea.

## 2018-01-28 NOTE — Assessment & Plan Note (Signed)
S: controlled on hydrochlorothiazide 25 mg- rarely forgets to take medication BP Readings from Last 3 Encounters:  01/28/18 138/60  10/22/17 138/64  09/16/17 130/74  A/P: We discussed blood pressure goal of <140/90. Continue current meds:

## 2018-01-28 NOTE — Progress Notes (Signed)
Subjective:  [name] is a 84 y.o. year old very pleasant female patient who presents for/with See problem oriented charting ROS- No chest pain or shortness of breath (other than if pushing herself with longer walk- this is stable). No headache or blurry vision.  Some edema  Past Medical History-  Patient Active Problem List   Diagnosis Date Noted  . ACP (advance care planning) 12/17/2012    Priority: High  . History of CVA in adulthood     Priority: High  . CKD (chronic kidney disease), stage III ([place]) 07/29/2017    Priority: Medium  . Hyperlipidemia 06/28/2014    Priority: Medium  . Hyperglycemia 06/28/2014    Priority: Medium  . Essential hypertension 12/02/2006    Priority: Medium  . Routine health maintenance 08/25/2011    Priority: Low  . GERD 12/02/2006    Priority: Low    Medications- reviewed and updated Current Outpatient Medications  Medication Sig Dispense Refill  . atorvastatin (LIPITOR) 20 MG tablet Take 1 tablet (20 mg total) by mouth once a week. 12 tablet 3  . Bisacodyl (DULCOLAX PO) Take 1 tablet by mouth as needed.    . clopidogrel (PLAVIX) 75 MG tablet TAKE (1) TABLET DAILY. 91 tablet 3  . diltiazem (CARDIZEM CD) 240 MG 24 hr capsule Take 1 capsule (240 mg total) by mouth daily. 91 capsule 3  . hydrochlorothiazide (HYDRODIURIL) 25 MG tablet Take 1 tablet (25 mg total) by mouth daily. 91 tablet 3  . Multiple Vitamins-Minerals (CENTRUM SILVER) tablet Take 1 tablet by mouth daily.      . pantoprazole (PROTONIX) 20 MG tablet Take 20 mg by mouth daily.     No current facility-administered medications for this visit.     Objective: BP 138/60 (BP Location: Left Arm, Patient Position: Sitting, Cuff Size: Large)   Pulse 68   Temp 98.4 F (36.9 C) (Oral)   Ht 5\' 3"  (1.6 m)   Wt 164 lb 9.6 oz (74.7 kg)   SpO2 96%   BMI 29.16 kg/m  Gen: NAD, resting comfortably CV: RRR no murmurs rubs or gallops Lungs: CTAB no crackles, wheeze, rhonchi Abdomen:  soft/nontender/nondistended/normal bowel sounds.  Ext: 1+ pitting edema Skin: warm, dry Neuro: speech normal, moves all extremities  Assessment/Plan:  Other notes: 1.With chronic PPI use-we will get B12 level with labs 2. 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 [facility]- enjoying it.   Essential hypertension S: controlled on hydrochlorothiazide 25 mg- rarely forgets to take medication BP Readings from Last 3 Encounters:  01/28/18 138/60  10/22/17 138/64  09/16/17 130/74  A/P: We discussed blood pressure goal of <140/90. Continue current meds:    CKD (chronic kidney disease), stage III ([place]) S: Patient with chronic kidney disease stage III-blood pressure control-hopefully blood pressure with improved control A/P: Update bmet today-hopefully stable. If had any worsening of filtration rate- could consider change to amlodipine which would be easier on kidneys - does have some edema though so might not be best idea.   Hyperlipidemia S: Poorly controlled on last check- we started atorvastatin 20 mg last visit for once a week use.  Ideally would take this more frequently given stroke history but she does not want to be on stronger dose.  She remains on Plavix for stroke prevention Lab Results  Component Value Date   CHOL 209 (H) 07/29/2017   HDL 81.70 07/29/2017   [place]  101 (H) 07/29/2017   LDLDIRECT 113.8 06/05/2007   TRIG 131.0 07/29/2017   CHOLHDL 3 07/29/2017   A/P: Update direct LDL today-hopefully improved  Hyperglycemia S: she loves sugars. Has had increased DM risk- if had another a1c 6.5 or above would diagnose with diabetes Lab Results  Component Value Date   HGBA1C 6.3 07/29/2017  A/P: update a1c- hoping remains under 6.5   Future Appointments  Date Time Provider [place]  11/11/2018  4:00 PM [facility]   Return in about 6 months (around 07/29/2018) for  physical.  Lab/Order associations: FASTING Essential hypertension - Plan: Comprehensive metabolic panel  Hyperlipidemia, unspecified hyperlipidemia type - Plan: LDL cholesterol, direct  Hyperglycemia - Plan: Hemoglobin A1c  High risk medication use - Plan: Vitamin B12  CKD (chronic kidney disease), stage III ([place])  Meds ordered this encounter  Medications  . atorvastatin (LIPITOR) 20 MG tablet    Sig: Take 1 tablet (20 mg total) by mouth once a week.    Dispense:  12 tablet    Refill:  3   Return precautions advised.  Garret Reddish, MD

## 2018-01-28 NOTE — Assessment & Plan Note (Signed)
S: Poorly controlled on last check- we started atorvastatin 20 mg last visit for once a week use.  Ideally would take this more frequently given stroke history but she does not want to be on stronger dose.  She remains on Plavix for stroke prevention Lab Results  Component Value Date   CHOL 209 (H) 07/29/2017   HDL 81.70 07/29/2017   LDLCALC 101 (H) 07/29/2017   LDLDIRECT 113.8 06/05/2007   TRIG 131.0 07/29/2017   CHOLHDL 3 07/29/2017   A/P: Update direct LDL today-hopefully improved

## 2018-01-28 NOTE — Patient Instructions (Signed)
Please stop by lab before you go  I am glad you are doing so well!  Hope you have a fantastic holiday season!

## 2018-08-04 ENCOUNTER — Ambulatory Visit (INDEPENDENT_AMBULATORY_CARE_PROVIDER_SITE_OTHER): Payer: Medicare Other | Admitting: Family Medicine

## 2018-08-04 ENCOUNTER — Encounter: Payer: Self-pay | Admitting: Family Medicine

## 2018-08-04 ENCOUNTER — Other Ambulatory Visit: Payer: Self-pay

## 2018-08-04 VITALS — BP 130/78 | HR 72 | Temp 97.9°F | Ht 62.75 in | Wt 165.8 lb

## 2018-08-04 DIAGNOSIS — N183 Chronic kidney disease, stage 3 unspecified: Secondary | ICD-10-CM

## 2018-08-04 DIAGNOSIS — I10 Essential (primary) hypertension: Secondary | ICD-10-CM

## 2018-08-04 DIAGNOSIS — G8929 Other chronic pain: Secondary | ICD-10-CM

## 2018-08-04 DIAGNOSIS — R739 Hyperglycemia, unspecified: Secondary | ICD-10-CM

## 2018-08-04 DIAGNOSIS — E663 Overweight: Secondary | ICD-10-CM

## 2018-08-04 DIAGNOSIS — Z0000 Encounter for general adult medical examination without abnormal findings: Secondary | ICD-10-CM | POA: Diagnosis not present

## 2018-08-04 DIAGNOSIS — M545 Low back pain, unspecified: Secondary | ICD-10-CM | POA: Insufficient documentation

## 2018-08-04 DIAGNOSIS — E785 Hyperlipidemia, unspecified: Secondary | ICD-10-CM | POA: Diagnosis not present

## 2018-08-04 LAB — CBC
HCT: 43 % (ref 36.0–46.0)
Hemoglobin: 15 g/dL (ref 12.0–15.0)
MCHC: 34.9 g/dL (ref 30.0–36.0)
MCV: 88.6 fl (ref 78.0–100.0)
Platelets: 382 10*3/uL (ref 150.0–400.0)
RBC: 4.85 Mil/uL (ref 3.87–5.11)
RDW: 12.9 % (ref 11.5–15.5)
WBC: 8.1 10*3/uL (ref 4.0–10.5)

## 2018-08-04 LAB — COMPREHENSIVE METABOLIC PANEL
ALT: 15 U/L (ref 0–35)
AST: 16 U/L (ref 0–37)
Albumin: 4.1 g/dL (ref 3.5–5.2)
Alkaline Phosphatase: 103 U/L (ref 39–117)
BUN: 14 mg/dL (ref 6–23)
CO2: 29 mEq/L (ref 19–32)
Calcium: 9.9 mg/dL (ref 8.4–10.5)
Chloride: 98 mEq/L (ref 96–112)
Creatinine, Ser: 1.04 mg/dL (ref 0.40–1.20)
GFR: 49.4 mL/min — ABNORMAL LOW (ref >60.00)
Glucose, Bld: 119 mg/dL — ABNORMAL HIGH (ref 70–99)
Potassium: 3.3 mEq/L — ABNORMAL LOW (ref 3.5–5.1)
Sodium: 138 mEq/L (ref 135–145)
Total Bilirubin: 0.5 mg/dL (ref 0.2–1.2)
Total Protein: 7.4 g/dL (ref 6.0–8.3)

## 2018-08-04 LAB — LIPID PANEL
Cholesterol: 172 mg/dL (ref 0–200)
HDL: 78.1 mg/dL (ref >39.00)
LDL Cholesterol: 67 mg/dL (ref 0–99)
NonHDL: 93.71
Total CHOL/HDL Ratio: 2
Triglycerides: 136 mg/dL (ref 0.0–149.0)
VLDL: 27.2 mg/dL (ref 0.0–40.0)

## 2018-08-04 LAB — HEMOGLOBIN A1C: Hgb A1c MFr Bld: 6.6 % — ABNORMAL HIGH (ref 4.6–6.5)

## 2018-08-04 MED ORDER — PANTOPRAZOLE SODIUM 20 MG PO TBEC: 20.0000 mg | DELAYED_RELEASE_TABLET | Freq: Every day | ORAL | 3 refills | Status: DC

## 2018-08-04 MED ORDER — ATORVASTATIN CALCIUM 20 MG PO TABS: 20.0000 mg | ORAL_TABLET | ORAL | 3 refills | Status: DC

## 2018-08-04 MED ORDER — HYDROCHLOROTHIAZIDE 25 MG PO TABS: 25.0000 mg | ORAL_TABLET | Freq: Every day | ORAL | 3 refills | Status: DC

## 2018-08-04 MED ORDER — CLOPIDOGREL BISULFATE 75 MG PO TABS: ORAL_TABLET | ORAL | 3 refills | Status: DC

## 2018-08-04 MED ORDER — DILTIAZEM HCL ER COATED BEADS 240 MG PO CP24: 240.0000 mg | ORAL_CAPSULE | Freq: Every day | ORAL | 3 refills | Status: DC

## 2018-08-04 NOTE — Progress Notes (Signed)
Phone: [phone]   Subjective:  Patient presents today for their annual physical. Chief complaint-noted.   See problem oriented charting- ROS- full  review of systems was completed and negative except for: some low back pain with walking  The following were reviewed and entered/updated in epic: Past Medical History:  Diagnosis Date  . Adenomatous polyps   . Arthritis   . Cerebrovascular accident ([place])   . Cervical cancer ([place])    Cervical Ca-no radiation or chemo  . Diverticulosis   . Edema   . Effusion of ankle and foot joint   . GERD (gastroesophageal reflux disease)   . History of UTI   . Hypertension   . Observation for suspected cardiovascular disease   . Other abnormal blood chemistry   . Peptic ulcer disease   . Pyloric stenosis   . S/P total abdominal hysterectomy   . Special screening for malignant neoplasm of colon    Patient Active Problem List   Diagnosis Date Noted  . ACP (advance care planning) 12/17/2012    Priority: High  . History of CVA in adulthood     Priority: High  . CKD (chronic kidney disease), stage III ([place]) 07/29/2017    Priority: Medium  . Hyperlipidemia 06/28/2014    Priority: Medium  . Hyperglycemia 06/28/2014    Priority: Medium  . Essential hypertension 12/02/2006    Priority: Medium  . Routine health maintenance 08/25/2011    Priority: Low  . GERD 12/02/2006    Priority: Low  . Low back pain without sciatica 08/04/2018   Past Surgical History:  Procedure Laterality Date  . ABDOMINAL HYSTERECTOMY     including cervix  . CATARACT EXTRACTION  2008   Dr Kathrin Penner  . cyst on right hand removed    . TEE WITHOUT CARDIOVERSION  11/20/2011   TEE with bubble study-PFO    Family History  Problem Relation Age of Onset  . Heart failure Mother   . Hypertension Mother   . Coronary artery disease Mother   . Hypertension Sister   . Stroke Brother   . Hypertension Maternal Grandmother   . Stroke Maternal Grandmother   .  Hypertension Maternal Grandfather     Medications- reviewed and updated Current Outpatient Medications  Medication Sig Dispense Refill  . atorvastatin (LIPITOR) 20 MG tablet Take 1 tablet (20 mg total) by mouth once a week. 13 tablet 3  . Bisacodyl (DULCOLAX PO) Take 1 tablet by mouth as needed.    . clopidogrel (PLAVIX) 75 MG tablet TAKE (1) TABLET DAILY. 91 tablet 3  . diltiazem (CARDIZEM CD) 240 MG 24 hr capsule Take 1 capsule (240 mg total) by mouth daily. 91 capsule 3  . hydrochlorothiazide (HYDRODIURIL) 25 MG tablet Take 1 tablet (25 mg total) by mouth daily. 91 tablet 3  . Multiple Vitamins-Minerals (CENTRUM SILVER) tablet Take 1 tablet by mouth daily.      . pantoprazole (PROTONIX) 20 MG tablet Take 1 tablet (20 mg total) by mouth daily. 90 tablet 3   No current facility-administered medications for this visit.     Allergies-reviewed and updated No Known Allergies  Social History   Social History Narrative   HSG, [employer]. Married '50- widowed after 72 years.   [facility]ives in [facility] [place]- 2 rooms (living room and bedroom and 2 balconies)      On the list [facility]   ACP/End of life: No CPR, no heroic/futile treatment, i.e. prolonged ICU care. HCPA sister [name] tele [phone].  Patient is retired from [employer] after 35 years   Right handed.   Caffeine two cups of coffee daily. Very Rare coke.      Hobbies: play golf, play bridge   Objective  Objective:  BP 130/78   Pulse 72   Temp 97.9 F (36.6 C) (Oral)   Ht 5' 2.75" (1.594 m)   Wt 165 lb 12.8 oz (75.2 kg)   SpO2 95%   BMI 29.60 kg/m  Gen: NAD, resting comfortably HEENT: Mucous membranes are moist. Oropharynx normal Neck: no thyromegaly CV: RRR no murmurs rubs or gallops Lungs: CTAB no crackles, wheeze, rhonchi Abdomen: soft/nontender/nondistended/normal bowel sounds. No rebound or guarding.  Ext: 1+ edema stable Skin: warm, dry Neuro: grossly normal, moves all extremities, PERRLA    Assessment and Plan    85 y.o. female presenting for annual physical.  Health Maintenance counseling: 1. Anticipatory guidance: Patient counseled regarding regular dental exams -q6 months, eye exams - yearly,  avoiding smoking and second hand smoke , limiting alcohol to 1 beverage per day- rare wine .   2. Risk factor reduction:  Advised patient of need for regular exercise and diet rich and fruits and vegetables to reduce risk of heart attack and stroke. Exercise- exercise down with covid 19 and not playing golf now- exercising on hold at [facility]- has on closed circuit tv- encouraged her to do this. Diet-eats [facility] home meals- trying to eat healthy- has cereal for breakfast on her own. .  Wt Readings from Last 3 Encounters:  08/04/18 165 lb 12.8 oz (75.2 kg)  01/28/18 164 lb 9.6 oz (74.7 kg)  10/22/17 161 lb 12.8 oz (73.4 kg)  3. Immunizations/screenings/ancillary studies- continues to decline all immunizations. Would need TD if cut/scrape 4. Cervical cancer screening- passed age based screening recs 5. Breast cancer screening-  Passed age based screening recs 6. Colon cancer screening -  Passed age based screening recs. Anoscopy a few years ago showed hemorrhoids in setting of constipation as source or fectal bleeding. No recent rectal bleeding.  7. Skin cancer screening- declines any interventions/referrals.  8. Osteoporosis screening at 45- declines dexa- as would not take medicine even if has osteoporosis 9. former smoker but quit in 1950 so no further screenigns based on smoking  Status of chronic or acute concerns   M[facility] to [facility] in August 2019- home sold within 10 days- person paid cash without inspection- he bought furniture she didn't take with her- feels very fortunate  #GERD- wants to continue with protonix instead of trying PPI (may get more aggressive if worsening CKD III).  Stable. Continue current medications.  B12 levels November 2019 were normal   #Hyperglycemia- at high risk for diabetes- if has another 6.5 would change diagnosis but unlikely to start treatment as long as below 7.5.  Update A1c today Lab Results  Component Value Date   HGBA1C 6.4 01/28/2018   #CKD III- Gfr typically in 53s or 50s. Will update today.  Suspect stable-continue to avoid NSAIDs, control cholesterol and monitor blood pressure. PPI not ideal but she wants to continue  #Hyperlipidemia- compliant with atorvastatin 20mg  once a week-update lipids today. Ideally LDL would be under 70 but patient states she would be hesitant to increase statin dose  # history of stroke- compliant with plavix-stable-continue  # some low back pain- would feel better with a walker- wrote rx for her. Feels like her balance is good  #covid education- social distancing the best she can- grocery store every  10 days  Declines q6 months- agrees to yearly visit Future Appointments  Date Time Provider [place]  08/10/2019  9:20 AM Marin Olp, MD [facility]   Lab/Order associations: I believe she was fasting  Preventative health care - Plan: CBC, Comprehensive metabolic panel, Lipid panel, Hemoglobin A1c  Hyperlipidemia, unspecified hyperlipidemia type - Plan: CBC, Comprehensive metabolic panel, Lipid panel  Hyperglycemia - Plan: Hemoglobin A1c  CKD (chronic kidney disease), stage III ([place]) - Plan: Comprehensive metabolic panel  Essential hypertension - Plan: CBC, Comprehensive metabolic panel, Lipid panel  Chronic bilateral low back pain without sciatica  Overweight  Meds ordered this encounter  Medications  . atorvastatin (LIPITOR) 20 MG tablet    Sig: Take 1 tablet (20 mg total) by mouth once a week.    Dispense:  13 tablet    Refill:  3  . diltiazem (CARDIZEM CD) 240 MG 24 hr capsule    Sig: Take 1 capsule (240 mg total) by mouth daily.    Dispense:  91 capsule    Refill:  3  . hydrochlorothiazide (HYDRODIURIL) 25 MG tablet    Sig: Take 1 tablet (25  mg total) by mouth daily.    Dispense:  91 tablet    Refill:  3  . pantoprazole (PROTONIX) 20 MG tablet    Sig: Take 1 tablet (20 mg total) by mouth daily.    Dispense:  90 tablet    Refill:  3  . clopidogrel (PLAVIX) 75 MG tablet    Sig: TAKE (1) TABLET DAILY.    Dispense:  91 tablet    Refill:  3    Return precautions advised.  Garret Reddish, MD

## 2018-08-04 NOTE — Patient Instructions (Addendum)
lease stop by lab before you go If you do not have mychart- we will call you about results within 5 business days of [place] receiving them.  If you have mychart- we will send your results within 3 business days of [place] receiving them.  If abnormal or we want to clarify a result, we will call or mychart you to make sure you receive the message.  If you have questions or concerns or don't hear within 5-7 days, please send [place] a message or call us.     Great to see you today!

## 2018-08-05 ENCOUNTER — Other Ambulatory Visit: Payer: Self-pay | Admitting: Physical Therapy

## 2018-08-05 DIAGNOSIS — R739 Hyperglycemia, unspecified: Secondary | ICD-10-CM

## 2018-11-11 ENCOUNTER — Ambulatory Visit: Payer: Medicare Other

## 2018-12-22 NOTE — Progress Notes (Signed)
Phone [phone]   Subjective:  [name] is a 85 y.o. year old very pleasant female patient who presents for/with See problem oriented charting Chief Complaint  Patient presents with   Follow-up   Diabetes   ROS- no chest pain. No blurry vision. No significant edema increase. Does get winded with activity but admits to being more sedentary lately.    Past Medical History-  Patient Active Problem List   Diagnosis Date Noted   Type 2 diabetes mellitus with diabetic chronic kidney disease ([place]) 06/28/2014    Priority: High   ACP (advance care planning) 12/17/2012    Priority: High   History of CVA in adulthood     Priority: High   CKD (chronic kidney disease), stage III 07/29/2017    Priority: Medium   Hyperlipidemia associated with type 2 diabetes mellitus ([place]) 06/28/2014    Priority: Medium   Hypertension associated with diabetes ([place]) 12/02/2006    Priority: Medium   Routine health maintenance 08/25/2011    Priority: Low   GERD 12/02/2006    Priority: Low   Low back pain without sciatica 08/04/2018    Medications- reviewed and updated Current Outpatient Medications  Medication Sig Dispense Refill   atorvastatin (LIPITOR) 20 MG tablet Take 1 tablet (20 mg total) by mouth once a week. 13 tablet 3   Bisacodyl (DULCOLAX PO) Take 1 tablet by mouth as needed.     clopidogrel (PLAVIX) 75 MG tablet TAKE (1) TABLET DAILY. 91 tablet 3   diltiazem (CARDIZEM CD) 240 MG 24 hr capsule Take 1 capsule (240 mg total) by mouth daily. 91 capsule 3   hydrochlorothiazide (HYDRODIURIL) 25 MG tablet Take 1 tablet (25 mg total) by mouth daily. 91 tablet 3   Multiple Vitamins-Minerals (CENTRUM SILVER) tablet Take 1 tablet by mouth daily.       pantoprazole (PROTONIX) 20 MG tablet Take 1 tablet (20 mg total) by mouth daily. 90 tablet 3   No current facility-administered medications for this visit.      Objective:  BP 134/74    Pulse 76    Temp 98 F (36.7 C)    Ht 5'  2.75" (1.594 m)    Wt 169 lb 9.6 oz (76.9 kg)    SpO2 98%    BMI 30.28 kg/m  Gen: NAD, resting comfortably CV: RRR no murmurs rubs or gallops Lungs: CTAB no crackles, wheeze, rhonchi Abdomen: soft/nontender/nondistended/normal bowel sounds Ext: trace edema Skin: warm, dry Neuro: grossly normal, moves all extremities  Diabetic Foot Exam - Simple   Simple Foot Form Diabetic Foot exam was performed with the following findings: Yes 12/29/2018  9:01 AM  Visual Inspection No deformities, no ulcerations, no other skin breakdown bilaterally: Yes Sensation Testing Intact to touch and monofilament testing bilaterally: Yes Pulse Check Posterior Tibialis and Dorsalis pulse intact bilaterally: Yes Comments        Assessment and Plan   #History of stroke-followed with Dr. Leonie Man in the past. Still on plavix- last visit 2018- we are now prescribing  Diabetes S:  controlled on diet and exercise.  Patient has not wanted to pursue Metformin-we have discussed that she has another A1c of 6.5 or above with diagnosis of diabetes. CBGs- does not have meter and declines for now Exercise and diet-  walking has slowed down. Uses walker if walks any distance.  Lab Results  Component Value Date   HGBA1C 6.0 (A) 12/29/2018   HGBA1C 6.6 (H) 08/04/2018   HGBA1C 6.4 01/28/2018  A/P: Diet controlled diabetes with a1c of 6.0 - we will continue without medicine- she is going to try to remain active and try to eat a healthy diet -advised need for diabetic eye exam  #hyperlipidemia S:  controlled on atorvastatin 20 mg once a week Lab Results  Component Value Date   CHOL 172 08/04/2018   HDL 78.10 08/04/2018   LDLCALC 67 08/04/2018   LDLDIRECT 69.0 01/28/2018   TRIG 136.0 08/04/2018   CHOLHDL 2 08/04/2018   A/P: Stable. Continue current medications.    #hypertension/CKD stage III S: controlled on diltiazem 240 mg extended release, hydrochlorothiazide 25 mg  GFR has been largely stable in the high  40s.  Patient avoids NSAIDs for most part- very very rare aleve - once a month  BP Readings from Last 3 Encounters:  12/29/18 134/74  12/29/18 138/74  08/04/18 130/78  A/P: HTN and CKD stable- continue current medicines. Could consider ace I in future.   - offered to update BMP but she declines for now  #Concern for UTI S: pt states she has had a "cystitis flare" up for about 2 weeks, but it is not as bad as it was. Improving with water and cranberry juice  Complains of dysuria: mild sensation now- was worse at first; polyuria: yes; nocturia: increased to 2x a night; urgency: noted.  Symptoms are improving slightly.  ROS- no fever, chills, nausea, vomiting, flank pain. No blood in urine.  A/P: UA will be obtained. Will get culture. Empiric treatment with: opts out- wants to make sure has UTI before starting  Patient to follow up if new or worsening symptoms or failure to improve.   # declines all vaccines and would decline covid 19 vaccine  Recommended follow up: 6 month follow up - already scheduled for CPE Future Appointments  Date Time Provider [place]  08/10/2019  9:20 AM Marin Olp, MD [facility]   Lab/Order associations:   ICD-10-CM   1. Type 2 diabetes mellitus with stage 3a chronic kidney disease, without long-term current use of insulin (HCC)  E11.21 Microalbumin / creatinine urine ratio   N18.31   2. Stage 3a chronic kidney disease  [id number] Basic metabolic panel  3. Hyperlipidemia associated with type 2 diabetes mellitus ([place])  E11.69    E78.5   4. Hypertension associated with diabetes ([place])  E11.59    I10   5. Dysuria  R30.0 POCT Urinalysis Dipstick (Automated)    Urine Culture  6. Hyperglycemia  R73.9 POCT HgB A1C    Meds ordered this encounter  Medications   atorvastatin (LIPITOR) 20 MG tablet    Sig: Take 1 tablet (20 mg total) by mouth once a week.    Dispense:  13 tablet    Refill:  3    Return precautions advised.  Garret Reddish, MD

## 2018-12-22 NOTE — Patient Instructions (Addendum)
Health Maintenance Due  Topic Date Due  . INFLUENZA VACCINE -does not take/declines 10/10/2018  . TETANUS/TDAP -does not take/declines 12/17/2018   For next eye exam make sure they know you have diabetes and have them send me a copy .   Please stop by lab before you go If you do not have mychart- we will call you about results within 5 business days of [place] receiving them.  If you have mychart- we will send your results within 3 business days of [place] receiving them.  If abnormal or we want to clarify a result, we will call or mychart you to make sure you receive the message.  If you have questions or concerns or don't hear within 5-7 days, please send [place] a message or call us.   j

## 2018-12-29 ENCOUNTER — Ambulatory Visit (INDEPENDENT_AMBULATORY_CARE_PROVIDER_SITE_OTHER): Payer: Medicare Other | Admitting: Family Medicine

## 2018-12-29 ENCOUNTER — Other Ambulatory Visit: Payer: Self-pay

## 2018-12-29 ENCOUNTER — Encounter: Payer: Self-pay | Admitting: Family Medicine

## 2018-12-29 ENCOUNTER — Ambulatory Visit (INDEPENDENT_AMBULATORY_CARE_PROVIDER_SITE_OTHER): Payer: Medicare Other

## 2018-12-29 VITALS — BP 138/74 | Temp 98.0°F | Ht 63.0 in | Wt 169.5 lb

## 2018-12-29 VITALS — BP 134/74 | HR 76 | Temp 98.0°F | Ht 62.75 in | Wt 169.6 lb

## 2018-12-29 DIAGNOSIS — E1121 Type 2 diabetes mellitus with diabetic nephropathy: Secondary | ICD-10-CM | POA: Diagnosis not present

## 2018-12-29 DIAGNOSIS — E1159 Type 2 diabetes mellitus with other circulatory complications: Secondary | ICD-10-CM | POA: Diagnosis not present

## 2018-12-29 DIAGNOSIS — N1831 Chronic kidney disease, stage 3a: Secondary | ICD-10-CM

## 2018-12-29 DIAGNOSIS — E785 Hyperlipidemia, unspecified: Secondary | ICD-10-CM

## 2018-12-29 DIAGNOSIS — I152 Hypertension secondary to endocrine disorders: Secondary | ICD-10-CM

## 2018-12-29 DIAGNOSIS — E1169 Type 2 diabetes mellitus with other specified complication: Secondary | ICD-10-CM | POA: Diagnosis not present

## 2018-12-29 DIAGNOSIS — R300 Dysuria: Secondary | ICD-10-CM

## 2018-12-29 DIAGNOSIS — R739 Hyperglycemia, unspecified: Secondary | ICD-10-CM

## 2018-12-29 DIAGNOSIS — I10 Essential (primary) hypertension: Secondary | ICD-10-CM

## 2018-12-29 DIAGNOSIS — Z0000 Encounter for general adult medical examination without abnormal findings: Secondary | ICD-10-CM

## 2018-12-29 LAB — POC URINALSYSI DIPSTICK (AUTOMATED)
Bilirubin, UA: NEGATIVE
Blood, UA: NEGATIVE
Glucose, UA: NEGATIVE
Ketones, UA: NEGATIVE
Nitrite, UA: NEGATIVE
Protein, UA: NEGATIVE
Spec Grav, UA: 1.015 (ref 1.010–1.025)
Urobilinogen, UA: 0.2 E.U./dL
pH, UA: 8 (ref 5.0–8.0)

## 2018-12-29 LAB — BASIC METABOLIC PANEL
BUN: 21 mg/dL (ref 6–23)
CO2: 31 mEq/L (ref 19–32)
Calcium: 9.9 mg/dL (ref 8.4–10.5)
Chloride: 98 mEq/L (ref 96–112)
Creatinine, Ser: 1.15 mg/dL (ref 0.40–1.20)
GFR: 43.95 mL/min — ABNORMAL LOW (ref >60.00)
Glucose, Bld: 140 mg/dL — ABNORMAL HIGH (ref 70–99)
Potassium: 4 mEq/L (ref 3.5–5.1)
Sodium: 138 mEq/L (ref 135–145)

## 2018-12-29 LAB — POCT GLYCOSYLATED HEMOGLOBIN (HGB A1C): Hemoglobin A1C: 6 % — AB (ref 4.0–5.6)

## 2018-12-29 LAB — MICROALBUMIN / CREATININE URINE RATIO
Creatinine,U: 133.9 mg/dL
Microalb Creat Ratio: 2.5 mg/g (ref 0.0–30.0)
Microalb, Ur: 3.3 mg/dL — ABNORMAL HIGH (ref 0.0–1.9)

## 2018-12-29 MED ORDER — ATORVASTATIN CALCIUM 20 MG PO TABS: 20.0000 mg | ORAL_TABLET | ORAL | 3 refills | Status: DC

## 2018-12-29 NOTE — Progress Notes (Signed)
Subjective:   [name] is a 85 y.o. female who presents for Medicare Annual (Subsequent) preventive examination.  Review of Systems:   Cardiac Risk Factors include: advanced age (>75men, >47 women);hypertension;dyslipidemia     Objective:     Vitals: BP 138/74 (BP Location: Right Arm, Patient Position: Sitting, Cuff Size: Normal)   Temp 98 F (36.7 C) (Temporal)   Ht 5\' 3"  (1.6 m)   Wt 169 lb 8.5 oz (76.9 kg)   BMI 30.03 kg/m   Body mass index is 30.03 kg/m.  Advanced Directives 12/29/2018 10/22/2017 05/11/2014 11/18/2011 11/18/2011  Does Patient Have a Medical Advance Directive? Yes Yes - Patient would like information;Patient has advance directive, copy not in chart Patient has advance directive, copy not in chart  Type of Advance Directive Living will Living will [place];Living will Living will Living will  Does patient want to make changes to medical advance directive? No - Patient declined No - Patient declined - - -  Copy of [place] in Chart? - - No - copy requested Copy requested from family Copy requested from family  Would patient like information on creating a medical advance directive? - - - Advance directive packet given -  Pre-existing out of facility DNR order (yellow form or pink MOST form) - - - No No    Tobacco Social History   Tobacco Use  Smoking Status Former Smoker  . Quit date: 03/11/1948  . Years since quitting: 70.8  Smokeless Tobacco Never Used     Counseling given: Not Answered   Clinical Intake:  Pre-visit preparation completed: Yes  Pain : No/denies pain  Diabetes: No  How often do you need to have someone help you when you read instructions, pamphlets, or other written materials from your doctor or pharmacy?: 2 - Rarely  Interpreter Needed?: No  Information entered by :: Denman George LPN  Past Medical History:  Diagnosis Date  . Adenomatous polyps   . Arthritis   . Cerebrovascular  accident ([place])   . Cervical cancer ([place])    Cervical Ca-no radiation or chemo  . Diverticulosis   . Edema   . Effusion of ankle and foot joint   . GERD (gastroesophageal reflux disease)   . History of UTI   . Hypertension   . Observation for suspected cardiovascular disease   . Other abnormal blood chemistry   . Peptic ulcer disease   . Pyloric stenosis   . S/P total abdominal hysterectomy   . Special screening for malignant neoplasm of colon    Past Surgical History:  Procedure Laterality Date  . ABDOMINAL HYSTERECTOMY     including cervix  . CATARACT EXTRACTION  2008   Dr Kathrin Penner  . cyst on right hand removed    . TEE WITHOUT CARDIOVERSION  11/20/2011   TEE with bubble study-PFO   Family History  Problem Relation Age of Onset  . Heart failure Mother   . Hypertension Mother   . Coronary artery disease Mother   . Hypertension Sister   . Stroke Brother   . Hypertension Maternal Grandmother   . Stroke Maternal Grandmother   . Hypertension Maternal Grandfather    Social History   Socioeconomic History  . Marital status: Widowed    Spouse name: Not on file  . Number of children: 0  . Years of education: College  . Highest education level: Not on file  Occupational History  . Occupation: Retired  Employer: RETIRED  Social Needs  . Financial resource strain: Not on file  . Food insecurity    Worry: Not on file    Inability: Not on file  . Transportation needs    Medical: Not on file    Non-medical: Not on file  Tobacco Use  . Smoking status: Former Smoker    Quit date: 03/11/1948    Years since quitting: 70.8  . Smokeless tobacco: Never Used  Substance and Sexual Activity  . Alcohol use: Yes    Alcohol/week: 0.0 standard drinks    Comment: Occasional wine, not very often  . Drug use: No  . Sexual activity: Not Currently  Lifestyle  . Physical activity    Days per week: Not on file    Minutes per session: Not on file  . Stress: Not on file   Relationships  . Social Herbalist on phone: Not on file    Gets together: Not on file    Attends religious service: Not on file    Active member of club or organization: Not on file    Attends meetings of clubs or organizations: Not on file    Relationship status: Not on file  Other Topics Concern  . Not on file  Social History Narrative   HSG, [employer]. Married '50- widowed after 28 years.   Lives in friends home [place]- 2 rooms (living room and bedroom and 2 balconies)      On the list [facility]   ACP/End of life: No CPR, no heroic/futile treatment, i.e. prolonged ICU care. HCPA sister [name] tele [phone].      Patient is retired from [employer] after 35 years   Right handed.   Caffeine two cups of coffee daily. Very Rare coke.      Hobbies: play golf, play bridge    Outpatient Encounter Medications as of 12/29/2018  Medication Sig  . atorvastatin (LIPITOR) 20 MG tablet Take 1 tablet (20 mg total) by mouth once a week.  . Bisacodyl (DULCOLAX PO) Take 1 tablet by mouth as needed.  . clopidogrel (PLAVIX) 75 MG tablet TAKE (1) TABLET DAILY.  Marland Kitchen diltiazem (CARDIZEM CD) 240 MG 24 hr capsule Take 1 capsule (240 mg total) by mouth daily.  . hydrochlorothiazide (HYDRODIURIL) 25 MG tablet Take 1 tablet (25 mg total) by mouth daily.  . Multiple Vitamins-Minerals (CENTRUM SILVER) tablet Take 1 tablet by mouth daily.    . pantoprazole (PROTONIX) 20 MG tablet Take 1 tablet (20 mg total) by mouth daily.   No facility-administered encounter medications on file as of 12/29/2018.     Activities of Daily Living In your present state of health, do you have any difficulty performing the following activities: 12/29/2018  Hearing? N  Vision? N  Difficulty concentrating or making decisions? N  Walking or climbing stairs? Y  Comment at times requires the use of a walker  Dressing or bathing? N  Doing errands, shopping? N  Preparing Food and eating ? N  Using the  Toilet? N  In the past six months, have you accidently leaked urine? N  Comment some complaints of cystitis within the last 2 weeks  Do you have problems with loss of bowel control? N  Managing your Medications? N  Managing your Finances? N  Housekeeping or managing your Housekeeping? N  Some recent data might be hidden    Patient Care Team: Marin Olp, MD as PCP - General (Family Medicine) Shon Hough, MD as  Consulting Physician (Ophthalmology)    Assessment:   This is a routine wellness examination for [employer].  Exercise Activities and Dietary recommendations Current Exercise Habits: The patient does not participate in regular exercise at present  Goals    . Increase physical activity     Patient is moving to [facility] and they have a gym there. She is thinking about starting to work out when she moves there       [place]  12/29/2018 12/29/2018 10/22/2017 04/21/2017 04/03/2017  Falls in the past year? 0 0 No No No  Number falls in past yr: - 0 - - -  Injury with Fall? 0 0 - - -  Risk for fall due to : Impaired mobility - - - -  Follow up Education provided;Falls prevention discussed;Falls evaluation completed - - - -   Is the patient's home free of loose throw rugs in walkways, pet beds, electrical cords, etc?   yes      Grab bars in the bathroom? yes      Handrails on the stairs?   yes      Adequate lighting?   yes  Timed Get Up and Go performed: completed and within normal timeframe; no gait abnormalities noted    Depression Screen PHQ 2/9 Scores 12/29/2018 12/29/2018 08/04/2018 10/22/2017  PHQ - 2 Score 0 0 0 0     Cognitive Function-no cognitive concerns at this time      6CIT Screen 12/29/2018 10/22/2017  What Year? 0 points 0 points  What month? 0 points 0 points  What time? 0 points 0 points  Count back from 20 0 points 0 points  Months in reverse 0 points 0 points  Repeat phrase 0 points -  Total Score 0 -     There is no  immunization history on file for this patient.  Qualifies for Shingles Vaccine?Discussed and patient will check with pharmacy for coverage.  Patient education handout provided    Screening Tests Health Maintenance  Topic Date Due  . DEXA SCAN  06/27/2024 (Originally 02/25/1990)  . PNA vac Low Risk Adult (1 of 2 - PCV13) 06/27/2024 (Originally 02/25/1990)  . INFLUENZA VACCINE  Discontinued  . TETANUS/TDAP  Discontinued    Cancer Screenings: Lung: Low Dose CT Chest recommended if Age 65-80 years, 30 pack-year currently smoking OR have quit w/in 15years. Patient does not qualify. Breast:  Up to date on Mammogram? Yes; no longer indicated    Up to date of Bone Density/Dexa? Yes; no longer indicated  Colorectal: no longer indicated     Plan:  I have personally reviewed and addressed the Medicare Annual Wellness questionnaire and have noted the following in the patient's chart:  A. Medical and social history B. Use of alcohol, tobacco or illicit drugs  C. Current medications and supplements D. Functional ability and status E.  Nutritional status F.  Physical activity G. Advance directives H. List of other physicians I.  Hospitalizations, surgeries, and ER visits in previous 12 months J.  [place] such as hearing and vision if needed, cognitive and depression L. Referrals, records requested, and appointments- none   In addition, I have reviewed and discussed with patient certain preventive protocols, quality metrics, and best practice recommendations. A written personalized care plan for preventive services as well as general preventive health recommendations were provided to patient.   Signed,  Denman George, LPN  Nurse Health Advisor   Nurse Notes: no additional

## 2018-12-29 NOTE — Patient Instructions (Signed)
[name] , Thank you for taking time to come for your Medicare Wellness Visit. I appreciate your ongoing commitment to your health goals. Please review the following plan we discussed and let me know if I can assist you in the future.   Screening recommendations/referrals: Colorectal Screening: no longer indicated  Mammogram: no longer indicated  Bone Density: no longer indicated   Vision and Dental Exams: Recommended annual ophthalmology exams for early detection of glaucoma and other disorders of the eye Recommended annual dental exams for proper oral hygiene  Vaccinations: Influenza vaccine:  recommended this fall either at PCP office or through your local pharmacy  Pneumococcal vaccine: recommended  Tdap vaccine: recommended. Please call your insurance company to determine your out of pocket expense. You may also receive this vaccine at your local pharmacy or Health Dept. Shingles vaccine: Please call your insurance company to determine your out of pocket expense for the Shingrix vaccine. You may receive this vaccine at your local pharmacy.  Advanced directives: Please bring a copy of your POA (Power of Attorney) and/or Living Will to your next appointment.  Goals: Recommend to drink at least 6-8 8oz glasses of water per day.  Next appointment: Please schedule your Annual Wellness Visit with your Nurse Health Advisor in one year.  Preventive Care 83 Years and Older, Female Preventive care refers to lifestyle choices and visits with your health care provider that can promote health and wellness. What does preventive care include?  A yearly physical exam. This is also called an annual well check.  Dental exams once or twice a year.  Routine eye exams. Ask your health care provider how often you should have your eyes checked.  Personal lifestyle choices, including:  Daily care of your teeth and gums.  Regular physical activity.  Eating a healthy diet.  Avoiding tobacco and  drug use.  Limiting alcohol use.  Practicing safe sex.  Taking low-dose aspirin every day if recommended by your health care provider.  Taking vitamin and mineral supplements as recommended by your health care provider. What happens during an annual well check? The services and screenings done by your health care provider during your annual well check will depend on your age, overall health, lifestyle risk factors, and family history of disease. Counseling  Your health care provider may ask you questions about your:  Alcohol use.  Tobacco use.  Drug use.  Emotional well-being.  Home and relationship well-being.  Sexual activity.  Eating habits.  History of falls.  Memory and ability to understand (cognition).  Work and work Statistician.  Reproductive health. Screening  You may have the following tests or measurements:  Height, weight, and BMI.  Blood pressure.  Lipid and cholesterol levels. These may be checked every 5 years, or more frequently if you are over 56 years old.  Skin check.  Lung cancer screening. You may have this screening every year starting at age 61 if you have a 30-pack-year history of smoking and currently smoke or have quit within the past 15 years.  Fecal occult blood test (FOBT) of the stool. You may have this test every year starting at age 15.  Flexible sigmoidoscopy or colonoscopy. You may have a sigmoidoscopy every 5 years or a colonoscopy every 10 years starting at age 55.  Hepatitis C blood test.  Hepatitis B blood test.  Sexually transmitted disease (STD) testing.  Diabetes screening. This is done by checking your blood sugar (glucose) after you have not eaten for a while (  fasting). You may have this done every 1-3 years.  Bone density scan. This is done to screen for osteoporosis. You may have this done starting at age 13.  Mammogram. This may be done every 1-2 years. Talk to your health care provider about how often you  should have regular mammograms. Talk with your health care provider about your test results, treatment options, and if necessary, the need for more tests. Vaccines  Your health care provider may recommend certain vaccines, such as:  Influenza vaccine. This is recommended every year.  Tetanus, diphtheria, and acellular pertussis (Tdap, Td) vaccine. You may need a Td booster every 10 years.  Zoster vaccine. You may need this after age 1.  Pneumococcal 13-valent conjugate (PCV13) vaccine. One dose is recommended after age 68.  Pneumococcal polysaccharide (PPSV23) vaccine. One dose is recommended after age 48. Talk to your health care provider about which screenings and vaccines you need and how often you need them. This information is not intended to replace advice given to you by your health care provider. Make sure you discuss any questions you have with your health care provider. Document Released: 03/24/2015 Document Revised: 11/15/2015 Document Reviewed: 12/27/2014 Elsevier Interactive Patient Education  2017 [employer] Prevention in the Home Falls can cause injuries. They can happen to people of all ages. There are many things you can do to make your home safe and to help prevent falls. What can I do on the outside of my home?  Regularly fix the edges of walkways and driveways and fix any cracks.  Remove anything that might make you trip as you walk through a door, such as a raised step or threshold.  Trim any bushes or trees on the path to your home.  Use bright outdoor lighting.  Clear any walking paths of anything that might make someone trip, such as rocks or tools.  Regularly check to see if handrails are loose or broken. Make sure that both sides of any steps have handrails.  Any raised decks and porches should have guardrails on the edges.  Have any leaves, snow, or ice cleared regularly.  Use sand or salt on walking paths during winter.  Clean up any  spills in your garage right away. This includes oil or grease spills. What can I do in the bathroom?  Use night lights.  Install grab bars by the toilet and in the tub and shower. Do not use towel bars as grab bars.  Use non-skid mats or decals in the tub or shower.  If you need to sit down in the shower, use a plastic, non-slip stool.  Keep the floor dry. Clean up any water that spills on the floor as soon as it happens.  Remove soap buildup in the tub or shower regularly.  Attach bath mats securely with double-sided non-slip rug tape.  Do not have throw rugs and other things on the floor that can make you trip. What can I do in the bedroom?  Use night lights.  Make sure that you have a light by your bed that is easy to reach.  Do not use any sheets or blankets that are too big for your bed. They should not hang down onto the floor.  Have a firm chair that has side arms. You can use this for support while you get dressed.  Do not have throw rugs and other things on the floor that can make you trip. What can I do in the kitchen?  Clean up any spills right away.  Avoid walking on wet floors.  Keep items that you use a lot in easy-to-reach places.  If you need to reach something above you, use a strong step stool that has a grab bar.  Keep electrical cords out of the way.  Do not use floor polish or wax that makes floors slippery. If you must use wax, use non-skid floor wax.  Do not have throw rugs and other things on the floor that can make you trip. What can I do with my stairs?  Do not leave any items on the stairs.  Make sure that there are handrails on both sides of the stairs and use them. Fix handrails that are broken or loose. Make sure that handrails are as long as the stairways.  Check any carpeting to make sure that it is firmly attached to the stairs. Fix any carpet that is loose or worn.  Avoid having throw rugs at the top or bottom of the stairs. If you  do have throw rugs, attach them to the floor with carpet tape.  Make sure that you have a light switch at the top of the stairs and the bottom of the stairs. If you do not have them, ask someone to add them for you. What else can I do to help prevent falls?  Wear shoes that:  Do not have high heels.  Have rubber bottoms.  Are comfortable and fit you well.  Are closed at the toe. Do not wear sandals.  If you use a stepladder:  Make sure that it is fully opened. Do not climb a closed stepladder.  Make sure that both sides of the stepladder are locked into place.  Ask someone to hold it for you, if possible.  Clearly mark and make sure that you can see:  Any grab bars or handrails.  First and last steps.  Where the edge of each step is.  Use tools that help you move around (mobility aids) if they are needed. These include:  Canes.  Walkers.  Scooters.  Crutches.  Turn on the lights when you go into a dark area. Replace any light bulbs as soon as they burn out.  Set up your furniture so you have a clear path. Avoid moving your furniture around.  If any of your floors are uneven, fix them.  If there are any pets around you, be aware of where they are.  Review your medicines with your doctor. Some medicines can make you feel dizzy. This can increase your chance of falling. Ask your doctor what other things that you can do to help prevent falls. This information is not intended to replace advice given to you by your health care provider. Make sure you discuss any questions you have with your health care provider. Document Released: 12/22/2008 Document Revised: 08/03/2015 Document Reviewed: 04/01/2014 Elsevier Interactive Patient Education  2017 [employer].

## 2018-12-29 NOTE — Progress Notes (Signed)
I have reviewed and agree with note, evaluation, plan.  See my separate note from today  Stephen Hunter, MD  

## 2018-12-29 NOTE — Addendum Note (Signed)
Addended by: Francis Dowse T on: 12/29/2018 10:25 AM   Modules accepted: Orders

## 2018-12-29 NOTE — Assessment & Plan Note (Signed)
S:  controlled on diet and exercise.  Patient has not wanted to pursue Metformin-we have discussed that she has another A1c of 6.5 or above with diagnosis of diabetes. CBGs- does not have meter and declines for now Exercise and diet-  walking has slowed down. Uses walker if walks any distance.  Lab Results  Component Value Date   HGBA1C 6.0 (A) 12/29/2018   HGBA1C 6.6 (H) 08/04/2018   HGBA1C 6.4 01/28/2018   A/P: Diet controlled diabetes with a1c of 6.0 - we will continue without medicine- she is going to try to remain active and try to eat a healthy diet

## 2018-12-31 ENCOUNTER — Other Ambulatory Visit: Payer: Self-pay

## 2018-12-31 LAB — URINE CULTURE
MICRO NUMBER:: 1009723
SPECIMEN QUALITY:: ADEQUATE

## 2018-12-31 MED ORDER — CEPHALEXIN 250 MG PO CAPS: 250.0000 mg | ORAL_CAPSULE | Freq: Three times a day (TID) | ORAL | 0 refills | Status: DC

## 2018-12-31 NOTE — Progress Notes (Unsigned)
Rx sent in

## 2019-08-03 NOTE — Patient Instructions (Addendum)
Health Maintenance Due  Topic Date Due  . OPHTHALMOLOGY EXAM will send for notes  Never done  . COVID-19 Vaccine (1) updated  Never done  . HEMOGLOBIN A1C will get with labs.  06/29/2019      Try to do exercise class once a week or going to work out room and ride bike for 15 minutes.   With walker you may want to try to go from setting to standing without using hands. That will help build your strength.   Thanks for giving [place] your covid vaccination information.   If you get any cuts or scrapes you may want to get a TD vaccination.   Diabetes: You have been doing well. Continue your efforts and keeping your blood sugar down.   Cholesterol:  continue your Atorvastatin once a week  Blood pressures: Was just a little elevated in office today. We are not going to make any changes today.   Try to take tylenol instead of Aleve due to kidney function. If you do need to use Aleve try to limit to only 1 time a month   Follow up in 6 months

## 2019-08-03 NOTE — Progress Notes (Signed)
Phone: [phone]   Subjective:  Patient presents today for their annual physical. Chief complaint-noted.   See problem oriented charting- Review of Systems  Constitutional: Negative for chills and fever.  HENT: Positive for hearing loss. Negative for tinnitus.        Has hearing aids   Eyes: Negative for blurred vision and double vision.       New macular degeneration in right eye   Respiratory: Negative for cough, shortness of breath and wheezing.   Cardiovascular: Negative for chest pain, palpitations and leg swelling.  Gastrointestinal: Negative for blood in stool, constipation, diarrhea, heartburn, nausea and vomiting.  Genitourinary: Negative for dysuria, frequency and urgency.  Musculoskeletal: Positive for back pain and joint pain. Negative for neck pain.       On going back and knee pain. Not increased or new. Takes OTC medications for pain.   Skin: Negative for rash.  Neurological: Negative for dizziness, seizures and headaches.  Endo/Heme/Allergies: Does not bruise/bleed easily.  Psychiatric/Behavioral: Negative for depression, memory loss and suicidal ideas. The patient does not have insomnia.    mildest SOB  The following were reviewed and entered/updated in epic: Past Medical History:  Diagnosis Date  . Adenomatous polyps   . Arthritis   . Cerebrovascular accident ([place])   . Cervical cancer ([place])    Cervical Ca-no radiation or chemo  . Diverticulosis   . Edema   . Effusion of ankle and foot joint   . GERD (gastroesophageal reflux disease)   . History of UTI   . Hypertension   . Observation for suspected cardiovascular disease   . Other abnormal blood chemistry   . Peptic ulcer disease   . Pyloric stenosis   . S/P total abdominal hysterectomy   . Special screening for malignant neoplasm of colon    Patient Active Problem List   Diagnosis Date Noted  . Type 2 diabetes mellitus with diabetic chronic kidney disease ([place]) 06/28/2014    Priority: High  .  ACP (advance care planning) 12/17/2012    Priority: High  . History of CVA in adulthood     Priority: High  . CKD (chronic kidney disease), stage III 07/29/2017    Priority: Medium  . Hyperlipidemia associated with type 2 diabetes mellitus ([place]) 06/28/2014    Priority: Medium  . Hypertension associated with diabetes ([place]) 12/02/2006    Priority: Medium  . Low back pain without sciatica 08/04/2018    Priority: Low  . Routine health maintenance 08/25/2011    Priority: Low  . GERD 12/02/2006    Priority: Low  . Macular degeneration of right eye 08/10/2019   Past Surgical History:  Procedure Laterality Date  . ABDOMINAL HYSTERECTOMY     including cervix  . CATARACT EXTRACTION  2008   Dr Kathrin Penner  . cyst on right hand removed    . TEE WITHOUT CARDIOVERSION  11/20/2011   TEE with bubble study-PFO    Family History  Problem Relation Age of Onset  . Heart failure Mother   . Hypertension Mother   . Coronary artery disease Mother   . Hypertension Sister   . Stroke Brother   . Hypertension Maternal Grandmother   . Stroke Maternal Grandmother   . Hypertension Maternal Grandfather     Medications- reviewed and updated Current Outpatient Medications  Medication Sig Dispense Refill  . atorvastatin (LIPITOR) 20 MG tablet Take 1 tablet (20 mg total) by mouth once a week. 13 tablet 3  . Bisacodyl (DULCOLAX PO) Take 1  tablet by mouth as needed.    . clopidogrel (PLAVIX) 75 MG tablet TAKE (1) TABLET DAILY. 91 tablet 3  . diltiazem (CARDIZEM CD) 240 MG 24 hr capsule Take 1 capsule (240 mg total) by mouth daily. 91 capsule 3  . hydrochlorothiazide (HYDRODIURIL) 25 MG tablet Take 1 tablet (25 mg total) by mouth daily. 91 tablet 3  . Multiple Vitamins-Minerals (CENTRUM SILVER) tablet Take 1 tablet by mouth daily.      . pantoprazole (PROTONIX) 20 MG tablet Take 1 tablet (20 mg total) by mouth daily. 90 tablet 3   No current facility-administered medications for this visit.     Allergies-reviewed and updated No Known Allergies  Social History   Social History Narrative   HSG, [employer]. Married '50- widowed after 22 years.   Lives in friends home [place]- 2 rooms (living room and bedroom and 2 balconies)      On the list [facility]   ACP/End of life: No CPR, no heroic/futile treatment, i.e. prolonged ICU care. HCPA sister [name] tele [phone].      Patient is retired from [employer] after 35 years   Right handed.   Caffeine two cups of coffee daily. Very Rare coke.      Hobbies: play golf, play bridge   Objective  Objective:  BP 140/68   Pulse 95   Temp 98.6 F (37 C) (Temporal)   Ht 5\' 3"  (1.6 m)   Wt 169 lb (76.7 kg)   SpO2 95%   BMI 29.94 kg/m  Gen: NAD, resting comfortably HEENT: Mucous membranes are moist. Oropharynx normal Neck: no thyromegaly CV: RRR no murmurs rubs or gallops Lungs: CTAB no crackles, wheeze, rhonchi Abdomen: soft/nontender/nondistended/normal bowel sounds. No rebound or guarding.  Ext: 1+ stable edema Skin: warm, dry Neuro: grossly normal, moves all extremities, PERRLA   Assessment and Plan   86 y.o. female presenting for annual physical.  Health Maintenance counseling: 1. Anticipatory guidance: Patient counseled regarding regular dental exams q6 months, eye exams last month will send for notes-macular degeneration,  avoiding smoking and second hand smoke , limiting alcohol to 1 beverage per day-rare wine. 2. Risk factor reduction:  Advised patient of need for regular exercise and diet rich and fruits and vegetables to reduce risk of heart attack and stroke. Exercise- not very motivated- we focused on trying to get to once a week. Diet-she eats the meals provided by friends home other than having cereal on her own in the mornings.  Wt Readings from Last 3 Encounters:  08/10/19 169 lb (76.7 kg)  12/29/18 169 lb 9.6 oz (76.9 kg)  12/29/18 169 lb 8.5 oz (76.9 kg)  3. Immunizations/screenings/ancillary  studies-discussed COVID-19 vaccination.  Discussed Shingrix vaccination.  She continues to decline most immunizations other than covid 19-due for Td gets cut/scraped Immunization History  Administered Date(s) Administered  . Influenza-Unspecified 01/09/2018  . Moderna SARS-COVID-2 Vaccination 05/02/2019, 05/10/2019  4. Cervical cancer screening- past age based screening recommendations 5. Breast cancer screening- past age to based screening recommendations 6. Colon cancer screening - past age based screening recommendations.  Denies blood in stool or melena.  She did have anoscopy several years ago which showed hemorrhoids in setting of constipation as source of rectal bleeding but no recent issues 7. Skin cancer screening-no dermatologist-she has declined referral in the past.  9. Osteoporosis screening at 64- declines DEXA-states she would not take medicine even if had osteoporosis. Wants to minimize medicines  -Former smoker-quit in 1950 no further screenings based  on smoking required  Status of chronic or acute concerns   #History of CVA/stroke-patient followed by Dr. Leonie Man in the past with last visit in 2018.  Patient remains on Plavix which we are prescribing  # Diabetes-typically well controlled with A1c under 7 S: Medication:None-diet controlled CBGs- does not have a meter Exercise and diet- good diet- needs to work on exercise.  A/P: Hopefully diabetes remains controlled-update A1c with labs today  -Encouraged diabetic eye exam- she has had this and we are trying to get a copy   #hyperlipidemia-LDL goal under 70 with history of stroke  S: Medication: Atorvastatin 20mg  once a week Lab Results  Component Value Date   CHOL 172 08/04/2018   HDL 78.10 08/04/2018   LDLCALC 67 08/04/2018   LDLDIRECT 69.0 01/28/2018   TRIG 136.0 08/04/2018   CHOLHDL 2 08/04/2018  A/P: hopefully controlled- update lipid panel  #hypertension S: medication: Cardizem 240mg  extended release, HCTZ  25Mg  BP Readings from Last 3 Encounters:  08/10/19 140/68  12/29/18 134/74  12/29/18 138/74  A/P: Very slightly high today-in general is well controlled so we opted not to make any changes in her medications-I also am hesitant to increase medicine as I do not want to increase her fall risk  #Chronic kidney disease stage III S: GFR is typically in the 40s range  -Patient knows to avoid NSAIDs- sparing aleve once a month advised tylenol instead if she knows she is going to be out shopping  A/P: hopefully stable-update kidney function with labs today.  We discussed trying to avoid NSAIDs if possible and recommended Tylenol instead   # GERD-history of peptic ulcer.  Failed H2 blocker trial S: Medication: Pantoprazole 20 mg  B12 levels related to PPI use: Last check August 10, 2019 A/P: Well-controlled-continue current medication -update b12 -ideally would be on h2 blocker but hasnt tolerated  Recommended follow up: 71-month follow-up or sooner if needed.   Lab/Order associations:   ICD-10-CM   1. Preventative health care  Z00.00 CBC with Differential/Platelet    Comprehensive metabolic panel    Lipid panel    Hemoglobin A1c    Vitamin B12  2. Hypertension associated with diabetes ([place])  E11.59    I10   3. Gastroesophageal reflux disease without esophagitis  K21.9   4. Hyperlipidemia associated with type 2 diabetes mellitus ([facility])  E11.69 CBC with Differential/Platelet   E78.5 Comprehensive metabolic panel    Lipid panel  5. Type 2 diabetes mellitus with stage 3a chronic kidney disease, without long-term current use of insulin ([facility])  E11.21 CBC with Differential/Platelet   N18.31 Comprehensive metabolic panel    Lipid panel    Hemoglobin A1c  6. History of CVA in adulthood  Z13.73   7. Stage 3a chronic kidney disease  N18.31   8. High risk medication use  Z79.899 Vitamin B12  9. Exudative age-related macular degeneration of right eye, unspecified stage ([place])  H35.3210     No orders  of the defined types were placed in this encounter.   Return precautions advised.  Garret Reddish, MD

## 2019-08-10 ENCOUNTER — Other Ambulatory Visit: Payer: Self-pay

## 2019-08-10 ENCOUNTER — Ambulatory Visit (INDEPENDENT_AMBULATORY_CARE_PROVIDER_SITE_OTHER): Payer: Medicare Other | Admitting: Family Medicine

## 2019-08-10 ENCOUNTER — Encounter: Payer: Self-pay | Admitting: Family Medicine

## 2019-08-10 VITALS — BP 140/68 | HR 95 | Temp 98.6°F | Ht 63.0 in | Wt 169.0 lb

## 2019-08-10 DIAGNOSIS — E1159 Type 2 diabetes mellitus with other circulatory complications: Secondary | ICD-10-CM

## 2019-08-10 DIAGNOSIS — Z79899 Other long term (current) drug therapy: Secondary | ICD-10-CM | POA: Diagnosis not present

## 2019-08-10 DIAGNOSIS — K219 Gastro-esophageal reflux disease without esophagitis: Secondary | ICD-10-CM | POA: Diagnosis not present

## 2019-08-10 DIAGNOSIS — E1121 Type 2 diabetes mellitus with diabetic nephropathy: Secondary | ICD-10-CM | POA: Diagnosis not present

## 2019-08-10 DIAGNOSIS — E785 Hyperlipidemia, unspecified: Secondary | ICD-10-CM | POA: Diagnosis not present

## 2019-08-10 DIAGNOSIS — I152 Hypertension secondary to endocrine disorders: Secondary | ICD-10-CM

## 2019-08-10 DIAGNOSIS — H353210 Exudative age-related macular degeneration, right eye, stage unspecified: Secondary | ICD-10-CM | POA: Insufficient documentation

## 2019-08-10 DIAGNOSIS — E1169 Type 2 diabetes mellitus with other specified complication: Secondary | ICD-10-CM | POA: Diagnosis not present

## 2019-08-10 DIAGNOSIS — Z0000 Encounter for general adult medical examination without abnormal findings: Secondary | ICD-10-CM | POA: Diagnosis not present

## 2019-08-10 DIAGNOSIS — I10 Essential (primary) hypertension: Secondary | ICD-10-CM

## 2019-08-10 DIAGNOSIS — Z8673 Personal history of transient ischemic attack (TIA), and cerebral infarction without residual deficits: Secondary | ICD-10-CM

## 2019-08-10 DIAGNOSIS — N1831 Chronic kidney disease, stage 3a: Secondary | ICD-10-CM | POA: Diagnosis not present

## 2019-08-10 LAB — LIPID PANEL
Cholesterol: 168 mg/dL (ref 0–200)
HDL: 73.6 mg/dL (ref >39.00)
LDL Cholesterol: 70 mg/dL (ref 0–99)
NonHDL: 93.98
Total CHOL/HDL Ratio: 2
Triglycerides: 119 mg/dL (ref 0.0–149.0)
VLDL: 23.8 mg/dL (ref 0.0–40.0)

## 2019-08-10 LAB — CBC WITH DIFFERENTIAL/PLATELET
Basophils Absolute: 0.1 10*3/uL (ref 0.0–0.1)
Basophils Relative: 0.7 % (ref 0.0–3.0)
Eosinophils Absolute: 0.2 10*3/uL (ref 0.0–0.7)
Eosinophils Relative: 2.3 % (ref 0.0–5.0)
HCT: 41.6 % (ref 36.0–46.0)
Hemoglobin: 14.2 g/dL (ref 12.0–15.0)
Lymphocytes Relative: 28.5 % (ref 12.0–46.0)
Lymphs Abs: 2.3 10*3/uL (ref 0.7–4.0)
MCHC: 34.1 g/dL (ref 30.0–36.0)
MCV: 89.8 fl (ref 78.0–100.0)
Monocytes Absolute: 0.7 10*3/uL (ref 0.1–1.0)
Monocytes Relative: 8.9 % (ref 3.0–12.0)
Neutro Abs: 4.9 10*3/uL (ref 1.4–7.7)
Neutrophils Relative %: 59.6 % (ref 43.0–77.0)
Platelets: 337 10*3/uL (ref 150.0–400.0)
RBC: 4.63 Mil/uL (ref 3.87–5.11)
RDW: 13.2 % (ref 11.5–15.5)
WBC: 8.2 10*3/uL (ref 4.0–10.5)

## 2019-08-10 LAB — COMPREHENSIVE METABOLIC PANEL
ALT: 18 U/L (ref 0–35)
AST: 17 U/L (ref 0–37)
Albumin: 4.1 g/dL (ref 3.5–5.2)
Alkaline Phosphatase: 123 U/L — ABNORMAL HIGH (ref 39–117)
BUN: 21 mg/dL (ref 6–23)
CO2: 30 mEq/L (ref 19–32)
Calcium: 9.5 mg/dL (ref 8.4–10.5)
Chloride: 101 mEq/L (ref 96–112)
Creatinine, Ser: 0.97 mg/dL (ref 0.40–1.20)
GFR: 53.42 mL/min — ABNORMAL LOW (ref >60.00)
Glucose, Bld: 131 mg/dL — ABNORMAL HIGH (ref 70–99)
Potassium: 4.1 mEq/L (ref 3.5–5.1)
Sodium: 139 mEq/L (ref 135–145)
Total Bilirubin: 0.5 mg/dL (ref 0.2–1.2)
Total Protein: 7.1 g/dL (ref 6.0–8.3)

## 2019-08-10 LAB — VITAMIN B12: Vitamin B-12: 680 pg/mL (ref 211–911)

## 2019-08-10 LAB — HEMOGLOBIN A1C: Hgb A1c MFr Bld: 6.8 % — ABNORMAL HIGH (ref 4.6–6.5)

## 2019-08-10 MED ORDER — HYDROCHLOROTHIAZIDE 25 MG PO TABS: 25.0000 mg | ORAL_TABLET | Freq: Every day | ORAL | 3 refills | Status: DC

## 2019-08-10 MED ORDER — DILTIAZEM HCL ER COATED BEADS 240 MG PO CP24: 240.0000 mg | ORAL_CAPSULE | Freq: Every day | ORAL | 3 refills | Status: DC

## 2019-08-10 MED ORDER — PANTOPRAZOLE SODIUM 20 MG PO TBEC: 20.0000 mg | DELAYED_RELEASE_TABLET | Freq: Every day | ORAL | 3 refills | Status: DC

## 2019-08-10 MED ORDER — CLOPIDOGREL BISULFATE 75 MG PO TABS: ORAL_TABLET | ORAL | 3 refills | Status: DC

## 2019-08-10 MED ORDER — ATORVASTATIN CALCIUM 20 MG PO TABS: 20.0000 mg | ORAL_TABLET | ORAL | 3 refills | Status: DC

## 2020-02-10 ENCOUNTER — Ambulatory Visit: Payer: Medicare Other | Admitting: Family Medicine

## 2020-02-11 ENCOUNTER — Other Ambulatory Visit: Payer: Self-pay

## 2020-02-11 ENCOUNTER — Ambulatory Visit (INDEPENDENT_AMBULATORY_CARE_PROVIDER_SITE_OTHER): Payer: Medicare Other

## 2020-02-11 VITALS — BP 130/64 | HR 85 | Temp 97.5°F | Resp 20 | Wt 169.2 lb

## 2020-02-11 DIAGNOSIS — Z0000 Encounter for general adult medical examination without abnormal findings: Secondary | ICD-10-CM

## 2020-02-11 NOTE — Patient Instructions (Addendum)
[name] , Thank you for taking time to come for your Medicare Wellness Visit. I appreciate your ongoing commitment to your health goals. Please review the following plan we discussed and let me know if I can assist you in the future.   Screening recommendations/referrals: Colonoscopy: No longer required Mammogram: No longer required Bone Density: no longer required Recommended yearly ophthalmology/optometry visit for glaucoma screening and checkup Recommended yearly dental visit for hygiene and checkup  Vaccinations: Influenza vaccine: Discontinued Pneumococcal vaccine: discontinued Tdap vaccine: discontinued   Covid-19:Completed 2/21 & 05/10/19  Advanced directives: Please bring a copy of your health care power of attorney and living will to the office at your convenience.  Conditions/risks identified: Increase water and maybe lessen sweets  Next appointment: Follow up in one year for your annual wellness visit    Preventive Care 65 Years and Older, Female Preventive care refers to lifestyle choices and visits with your health care provider that can promote health and wellness. What does preventive care include?  A yearly physical exam. This is also called an annual well check.  Dental exams once or twice a year.  Routine eye exams. Ask your health care provider how often you should have your eyes checked.  Personal lifestyle choices, including:  Daily care of your teeth and gums.  Regular physical activity.  Eating a healthy diet.  Avoiding tobacco and drug use.  Limiting alcohol use.  Practicing safe sex.  Taking low-dose aspirin every day.  Taking vitamin and mineral supplements as recommended by your health care provider. What happens during an annual well check? The services and screenings done by your health care provider during your annual well check will depend on your age, overall health, lifestyle risk factors, and family history of disease. Counseling    Your health care provider may ask you questions about your:  Alcohol use.  Tobacco use.  Drug use.  Emotional well-being.  Home and relationship well-being.  Sexual activity.  Eating habits.  History of falls.  Memory and ability to understand (cognition).  Work and work Statistician.  Reproductive health. Screening  You may have the following tests or measurements:  Height, weight, and BMI.  Blood pressure.  Lipid and cholesterol levels. These may be checked every 5 years, or more frequently if you are over 29 years old.  Skin check.  Lung cancer screening. You may have this screening every year starting at age 20 if you have a 30-pack-year history of smoking and currently smoke or have quit within the past 15 years.  Fecal occult blood test (FOBT) of the stool. You may have this test every year starting at age 43.  Flexible sigmoidoscopy or colonoscopy. You may have a sigmoidoscopy every 5 years or a colonoscopy every 10 years starting at age 70.  Hepatitis C blood test.  Hepatitis B blood test.  Sexually transmitted disease (STD) testing.  Diabetes screening. This is done by checking your blood sugar (glucose) after you have not eaten for a while (fasting). You may have this done every 1-3 years.  Bone density scan. This is done to screen for osteoporosis. You may have this done starting at age 38.  Mammogram. This may be done every 1-2 years. Talk to your health care provider about how often you should have regular mammograms. Talk with your health care provider about your test results, treatment options, and if necessary, the need for more tests. Vaccines  Your health care provider may recommend certain vaccines, such as:  Influenza vaccine. This is recommended every year.  Tetanus, diphtheria, and acellular pertussis (Tdap, Td) vaccine. You may need a Td booster every 10 years.  Zoster vaccine. You may need this after age 71.  Pneumococcal 13-valent  conjugate (PCV13) vaccine. One dose is recommended after age 79.  Pneumococcal polysaccharide (PPSV23) vaccine. One dose is recommended after age 74. Talk to your health care provider about which screenings and vaccines you need and how often you need them. This information is not intended to replace advice given to you by your health care provider. Make sure you discuss any questions you have with your health care provider. Document Released: 03/24/2015 Document Revised: 11/15/2015 Document Reviewed: 12/27/2014 Elsevier Interactive Patient Education  2017 [employer] Prevention in the Home Falls can cause injuries. They can happen to people of all ages. There are many things you can do to make your home safe and to help prevent falls. What can I do on the outside of my home?  Regularly fix the edges of walkways and driveways and fix any cracks.  Remove anything that might make you trip as you walk through a door, such as a raised step or threshold.  Trim any bushes or trees on the path to your home.  Use bright outdoor lighting.  Clear any walking paths of anything that might make someone trip, such as rocks or tools.  Regularly check to see if handrails are loose or broken. Make sure that both sides of any steps have handrails.  Any raised decks and porches should have guardrails on the edges.  Have any leaves, snow, or ice cleared regularly.  Use sand or salt on walking paths during winter.  Clean up any spills in your garage right away. This includes oil or grease spills. What can I do in the bathroom?  Use night lights.  Install grab bars by the toilet and in the tub and shower. Do not use towel bars as grab bars.  Use non-skid mats or decals in the tub or shower.  If you need to sit down in the shower, use a plastic, non-slip stool.  Keep the floor dry. Clean up any water that spills on the floor as soon as it happens.  Remove soap buildup in the tub or  shower regularly.  Attach bath mats securely with double-sided non-slip rug tape.  Do not have throw rugs and other things on the floor that can make you trip. What can I do in the bedroom?  Use night lights.  Make sure that you have a light by your bed that is easy to reach.  Do not use any sheets or blankets that are too big for your bed. They should not hang down onto the floor.  Have a firm chair that has side arms. You can use this for support while you get dressed.  Do not have throw rugs and other things on the floor that can make you trip. What can I do in the kitchen?  Clean up any spills right away.  Avoid walking on wet floors.  Keep items that you use a lot in easy-to-reach places.  If you need to reach something above you, use a strong step stool that has a grab bar.  Keep electrical cords out of the way.  Do not use floor polish or wax that makes floors slippery. If you must use wax, use non-skid floor wax.  Do not have throw rugs and other things on the floor that  can make you trip. What can I do with my stairs?  Do not leave any items on the stairs.  Make sure that there are handrails on both sides of the stairs and use them. Fix handrails that are broken or loose. Make sure that handrails are as long as the stairways.  Check any carpeting to make sure that it is firmly attached to the stairs. Fix any carpet that is loose or worn.  Avoid having throw rugs at the top or bottom of the stairs. If you do have throw rugs, attach them to the floor with carpet tape.  Make sure that you have a light switch at the top of the stairs and the bottom of the stairs. If you do not have them, ask someone to add them for you. What else can I do to help prevent falls?  Wear shoes that:  Do not have high heels.  Have rubber bottoms.  Are comfortable and fit you well.  Are closed at the toe. Do not wear sandals.  If you use a stepladder:  Make sure that it is fully  opened. Do not climb a closed stepladder.  Make sure that both sides of the stepladder are locked into place.  Ask someone to hold it for you, if possible.  Clearly mark and make sure that you can see:  Any grab bars or handrails.  First and last steps.  Where the edge of each step is.  Use tools that help you move around (mobility aids) if they are needed. These include:  Canes.  Walkers.  Scooters.  Crutches.  Turn on the lights when you go into a dark area. Replace any light bulbs as soon as they burn out.  Set up your furniture so you have a clear path. Avoid moving your furniture around.  If any of your floors are uneven, fix them.  If there are any pets around you, be aware of where they are.  Review your medicines with your doctor. Some medicines can make you feel dizzy. This can increase your chance of falling. Ask your doctor what other things that you can do to help prevent falls. This information is not intended to replace advice given to you by your health care provider. Make sure you discuss any questions you have with your health care provider. Document Released: 12/22/2008 Document Revised: 08/03/2015 Document Reviewed: 04/01/2014 Elsevier Interactive Patient Education  2017 [employer].

## 2020-02-11 NOTE — Progress Notes (Signed)
Subjective:   [name] is a 86 y.o. female who presents for Medicare Annual (Subsequent) preventive examination.  Review of Systems     Cardiac Risk Factors include: advanced age (>30men, >65 women);diabetes mellitus;dyslipidemia;hypertension     Objective:    Today's Vitals   02/11/20 1004  BP: 130/64  Pulse: 85  Resp: 20  Temp: (!) 97.5 F (36.4 C)  SpO2: 92%  Weight: 169 lb 3.2 oz (76.7 kg)   Body mass index is 29.97 kg/m.  Advanced Directives 02/11/2020 12/29/2018 10/22/2017 05/11/2014 11/18/2011 11/18/2011  Does Patient Have a Medical Advance Directive? Yes Yes Yes - Patient would like information;Patient has advance directive, copy not in chart Patient has advance directive, copy not in chart  Type of Advance Directive [place];Living will Living will Living will [place];Living will Living will Living will  Does patient want to make changes to medical advance directive? - No - Patient declined No - Patient declined - - -  Copy of [place] in Chart? No - copy requested - - No - copy requested Copy requested from family Copy requested from family  Would patient like information on creating a medical advance directive? - - - - Advance directive packet given -  Pre-existing out of facility DNR order (yellow form or pink MOST form) - - - - No No    Current Medications (verified) Outpatient Encounter Medications as of 02/11/2020  Medication Sig  . atorvastatin (LIPITOR) 20 MG tablet Take 1 tablet (20 mg total) by mouth once a week.  . Bisacodyl (DULCOLAX PO) Take 1 tablet by mouth as needed.  . clopidogrel (PLAVIX) 75 MG tablet TAKE (1) TABLET DAILY.  Marland Kitchen diltiazem (CARDIZEM CD) 240 MG 24 hr capsule Take 1 capsule (240 mg total) by mouth daily.  . hydrochlorothiazide (HYDRODIURIL) 25 MG tablet Take 1 tablet (25 mg total) by mouth daily.  . Multiple Vitamins-Minerals (CENTRUM SILVER) tablet Take 1 tablet by mouth daily.     . pantoprazole (PROTONIX) 20 MG tablet Take 1 tablet (20 mg total) by mouth daily.   No facility-administered encounter medications on file as of 02/11/2020.    Allergies (verified) Patient has no known allergies.   History: Past Medical History:  Diagnosis Date  . Adenomatous polyps   . Arthritis   . Cerebrovascular accident ([place])   . Cervical cancer ([place])    Cervical Ca-no radiation or chemo  . Diverticulosis   . Edema   . Effusion of ankle and foot joint   . GERD (gastroesophageal reflux disease)   . History of UTI   . Hypertension   . Observation for suspected cardiovascular disease   . Other abnormal blood chemistry   . Peptic ulcer disease   . Pyloric stenosis   . S/P total abdominal hysterectomy   . Special screening for malignant neoplasm of colon    Past Surgical History:  Procedure Laterality Date  . ABDOMINAL HYSTERECTOMY     including cervix  . CATARACT EXTRACTION  2008   Dr Kathrin Penner  . cyst on right hand removed    . TEE WITHOUT CARDIOVERSION  11/20/2011   TEE with bubble study-PFO   Family History  Problem Relation Age of Onset  . Heart failure Mother   . Hypertension Mother   . Coronary artery disease Mother   . Hypertension Sister   . Stroke Brother   . Hypertension Maternal Grandmother   . Stroke Maternal Grandmother   .  Hypertension Maternal Grandfather    Social History   Socioeconomic History  . Marital status: Widowed    Spouse name: Not on file  . Number of children: 0  . Years of education: College  . Highest education level: Not on file  Occupational History  . Occupation: Retired    Fish farm manager: RETIRED  Tobacco Use  . Smoking status: Former Smoker    Quit date: 03/11/1948    Years since quitting: 71.9  . Smokeless tobacco: Never Used  Vaping Use  . Vaping Use: Never used  Substance and Sexual Activity  . Alcohol use: Yes    Alcohol/week: 0.0 standard drinks    Comment: Occasional wine, not very often  . Drug use: No  .  Sexual activity: Not Currently  Other Topics Concern  . Not on file  Social History Narrative   HSG, [employer]. Married '50- widowed after 3 years.   L[facility] [place]- 2 rooms (living room and bedroom and 2 balconies)      On the list [facility]   ACP/End of life: No CPR, no heroic/futile treatment, i.e. prolonged ICU care. HCPA sister [name] tele [phone].      Patient is retired from [employer] after 35 years   Right handed.   Caffeine two cups of coffee daily. Very Rare coke.      Hobbies: play golf, play bridge   Social Determinants of Health   Financial Resource Strain: Low Risk   . Difficulty of Paying Living Expenses: Not hard at all  Food Insecurity: No Food Insecurity  . Worried About Charity fundraiser in the Last Year: Never true  . Ran Out of Food in the Last Year: Never true  Transportation Needs: No Transportation Needs  . Lack of Transportation (Medical): No  . Lack of Transportation (Non-Medical): No  Physical Activity: Inactive  . Days of Exercise per Week: 0 days  . Minutes of Exercise per Session: 0 min  Stress: No Stress Concern Present  . Feeling of Stress : Not at all  Social Connections: Moderately Isolated  . Frequency of Communication with Friends and Family: Twice a week  . Frequency of Social Gatherings with Friends and Family: Three times a week  . Attends Religious Services: More than 4 times per year  . Active Member of Clubs or Organizations: No  . Attends Archivist Meetings: Never  . Marital Status: Widowed    Tobacco Counseling Counseling given: Not Answered   Clinical Intake:  Pre-visit preparation completed: Yes  Pain : No/denies pain     BMI - recorded: 29.97 Nutritional Status: BMI 25 -29 Overweight Nutritional Risks: None Diabetes: Yes CBG done?: No Did pt. bring in CBG monitor from home?: No  How often do you need to have someone help you when you read instructions, pamphlets, or other  written materials from your doctor or pharmacy?: 1 - Never  Diabetic?Nutrition Risk Assessment:  Has the patient had any N/V/D within the last 2 months?  No  Does the patient have any non-healing wounds?  No  Has the patient had any unintentional weight loss or weight gain?  No   Diabetes:  Is the patient diabetic?  Yes  If diabetic, was a CBG obtained today?  No  Did the patient bring in their glucometer from home?  No  How often do you monitor your CBG's? Not monitored.   Financial Strains and Diabetes Management:  Are you having any financial strains with the  device, your supplies or your medication? No .  Does the patient want to be seen by Chronic Care Management for management of their diabetes?  No  Would the patient like to be referred to a Nutritionist or for Diabetic Management?  No   Diabetic Exams:   Diabetic Eye Exam: Overdue for diabetic eye exam. Pt has been advised about the importance in completing this exam. Patient advised to call and schedule an eye exam. Diabetic Foot Exam: Overdue, Pt has been advised about the importance in completing this exam. Pt is scheduled for diabetic foot exam on 02/14/20.   Interpreter Needed?: No  Information entered by :: Charlott Rakes, LPN   Activities of Daily Living In your present state of health, do you have any difficulty performing the following activities: 02/11/2020  Hearing? Y  Comment weras hearing aids  Vision? N  Difficulty concentrating or making decisions? Y  Comment mild memory loss at times  Walking or climbing stairs? Y  Comment don't try  Dressing or bathing? N  Doing errands, shopping? N  Preparing Food and eating ? N  Using the Toilet? N  In the past six months, have you accidently leaked urine? N  Do you have problems with loss of bowel control? N  Managing your Medications? N  Managing your Finances? N  Housekeeping or managing your Housekeeping? N  Some recent data might be hidden    Patient  Care Team: Marin Olp, MD as PCP - General (Family Medicine) Shon Hough, MD as Consulting Physician (Ophthalmology)  Indicate any recent Medical Services you may have received from other than [facility] providers in the past year (date may be approximate).     Assessment:   This is a routine wellness examination for [employer].  Hearing/Vision screen  Hearing Screening   125Hz  250Hz  500Hz  1000Hz  2000Hz  3000Hz  4000Hz  6000Hz  8000Hz   Right ear:           Left ear:           Comments: Pt wears hearing aids  Vision Screening Comments: Pt follows up with Dr Juanito Doom for eye exams  Dietary issues and exercise activities discussed: Current Exercise Habits: The patient does not participate in regular exercise at present  Goals    . Increase physical activity     Patient is moving to [facility] and they have a gym there. She is thinking about starting to work out when she moves there    . Patient Stated     Drink more water and maybe lessen sweets      Depression Screen PHQ 2/9 Scores 02/11/2020 08/10/2019 12/29/2018 12/29/2018 08/04/2018 10/22/2017 04/21/2017  PHQ - 2 Score 0 0 0 0 0 0 0  PHQ- 9 Score - 0 - - - - -    Fall Risk Fall Risk  02/11/2020 08/10/2019 12/29/2018 12/29/2018 10/22/2017  Falls in the past year? 0 0 0 0 No  Number falls in past yr: 0 0 - 0 -  Injury with Fall? 0 0 0 0 -  Risk for fall due to : Impaired balance/gait;Impaired vision - Impaired mobility - -  Follow up Falls prevention discussed - Education provided;Falls prevention discussed;Falls evaluation completed - -    Any stairs in or around the home? No  If so, are there any without handrails? No  Home free of loose throw rugs in walkways, pet beds, electrical cords, etc? Yes  Adequate lighting in your home to reduce risk of falls? Yes  ASSISTIVE DEVICES UTILIZED TO PREVENT FALLS:  Life alert? Yes   Monitor button in independent living Use of a cane, walker or w/c? Yes  Grab bars in the  bathroom? Yes  Shower chair or bench in shower? Yes  Elevated toilet seat or a handicapped toilet? Yes   TIMED UP AND GO:  Was the test performed? Yes .  Length of time to ambulate 10 feet: 15 sec.   Gait slow and steady with assistive device  Cognitive Function:     6CIT Screen 02/11/2020 12/29/2018 10/22/2017  What Year? 0 points 0 points 0 points  What month? 0 points 0 points 0 points  What time? - 0 points 0 points  Count back from 20 0 points 0 points 0 points  Months in reverse 0 points 0 points 0 points  Repeat phrase 0 points 0 points -  Total Score - 0 -    Immunizations Immunization History  Administered Date(s) Administered  . Influenza-Unspecified 01/09/2018  . Moderna SARS-COVID-2 Vaccination 05/02/2019, 05/10/2019, 01/18/2020    TDAP status: Due, Education has been provided regarding the importance of this vaccine. Advised may receive this vaccine at local pharmacy or Health Dept. Aware to provide a copy of the vaccination record if obtained from local pharmacy or Health Dept. Verbalized acceptance and understanding. Flu Vaccine status: Declined, Education has been provided regarding the importance of this vaccine but patient still declined. Advised may receive this vaccine at local pharmacy or Health Dept. Aware to provide a copy of the vaccination record if obtained from local pharmacy or Health Dept. Verbalized acceptance and understanding. Pneumococcal vaccine status: Declined,  Education has been provided regarding the importance of this vaccine but patient still declined. Advised may receive this vaccine at local pharmacy or Health Dept. Aware to provide a copy of the vaccination record if obtained from local pharmacy or Health Dept. Verbalized acceptance and understanding.  Covid-19 vaccine status: Completed vaccines  Qualifies for Shingles Vaccine? Yes   Zostavax completed No   Shingrix Completed?: No.    Education has been provided regarding the importance  of this vaccine. Patient has been advised to call insurance company to determine out of pocket expense if they have not yet received this vaccine. Advised may also receive vaccine at local pharmacy or Health Dept. Verbalized acceptance and understanding.  Screening Tests Health Maintenance  Topic Date Due  . HEMOGLOBIN A1C  02/09/2020  . FOOT EXAM  02/13/2020 (Originally 12/29/2019)  . OPHTHALMOLOGY EXAM  02/13/2020 (Originally 02/26/1933)  . URINE MICROALBUMIN  02/13/2020 (Originally 12/29/2019)  . DEXA SCAN  06/27/2024 (Originally 02/25/1990)  . PNA vac Low Risk Adult (1 of 2 - PCV13) 06/27/2024 (Originally 02/25/1990)  . COVID-19 Vaccine  Completed  . INFLUENZA VACCINE  Discontinued  . TETANUS/TDAP  Discontinued    Health Maintenance  Health Maintenance Due  Topic Date Due  . HEMOGLOBIN A1C  02/09/2020    Colorectal cancer screening: No longer required.  Mammogram status: No longer required.     Additional Screening:    Vision Screening: Recommended annual ophthalmology exams for early detection of glaucoma and other disorders of the eye. Is the patient up to date with their annual eye exam?  Yes  Who is the provider or what is the name of the office in which the patient attends annual eye exams? Dr Juanito Doom   Dental Screening: Recommended annual dental exams for proper oral hygiene  Community Resource Referral / Chronic Care Management: CRR required this visit?  No   CCM required this visit?  No      Plan:     I have personally reviewed and noted the following in the patient's chart:   . Medical and social history . Use of alcohol, tobacco or illicit drugs  . Current medications and supplements . Functional ability and status . Nutritional status . Physical activity . Advanced directives . List of other physicians . Hospitalizations, surgeries, and ER visits in previous 12 months . Vitals . Screenings to include cognitive, depression, and  falls . Referrals and appointments  In addition, I have reviewed and discussed with patient certain preventive protocols, quality metrics, and best practice recommendations. A written personalized care plan for preventive services as well as general preventive health recommendations were provided to patient.     Willette Brace, LPN   90/05/147   Nurse Notes: None

## 2020-02-13 NOTE — Progress Notes (Signed)
Phone [phone] In person visit   Subjective:   [name] is a 86 y.o. year old very pleasant female patient who presents for/with See problem oriented charting Chief Complaint  Patient presents with  . Hypertension  . Hyperlipidemia  . Gastroesophageal Reflux   This visit occurred during the SARS-CoV-2 public health emergency.  Safety protocols were in place, including screening questions prior to the visit, additional usage of staff PPE, and extensive cleaning of exam room while observing appropriate contact time as indicated for disinfecting solutions.   Past Medical History-  Patient Active Problem List   Diagnosis Date Noted  . Type 2 diabetes mellitus with diabetic chronic kidney disease ([place]) 06/28/2014    Priority: High  . ACP (advance care planning) 12/17/2012    Priority: High  . History of CVA in adulthood     Priority: High  . Macular degeneration of right eye 08/10/2019    Priority: Medium  . CKD (chronic kidney disease), stage III ([place]) 07/29/2017    Priority: Medium  . Hyperlipidemia associated with type 2 diabetes mellitus ([place]) 06/28/2014    Priority: Medium  . Hypertension associated with diabetes ([place]) 12/02/2006    Priority: Medium  . Low back pain without sciatica 08/04/2018    Priority: Low  . Routine health maintenance 08/25/2011    Priority: Low  . GERD 12/02/2006    Priority: Low    Medications- reviewed and updated Current Outpatient Medications  Medication Sig Dispense Refill  . atorvastatin (LIPITOR) 20 MG tablet Take 1 tablet (20 mg total) by mouth once a week. 13 tablet 3  . Bisacodyl (DULCOLAX PO) Take 1 tablet by mouth as needed.    . clopidogrel (PLAVIX) 75 MG tablet TAKE (1) TABLET DAILY. 91 tablet 3  . diltiazem (CARDIZEM CD) 240 MG 24 hr capsule Take 1 capsule (240 mg total) by mouth daily. 91 capsule 3  . hydrochlorothiazide (HYDRODIURIL) 25 MG tablet Take 1 tablet (25 mg total) by mouth daily. 91 tablet 3  . Multiple  Vitamins-Minerals (CENTRUM SILVER) tablet Take 1 tablet by mouth daily.      . pantoprazole (PROTONIX) 20 MG tablet Take 1 tablet (20 mg total) by mouth daily. 90 tablet 3   No current facility-administered medications for this visit.     Objective:  BP 122/66   Pulse 74   Temp 98.2 F (36.8 C) (Temporal)   Resp 18   Ht 5\' 3"  (1.6 m)   Wt 168 lb (76.2 kg)   SpO2 94%   BMI 29.76 kg/m  Gen: NAD, resting comfortably CV: RRR no murmurs rubs or gallops Lungs: CTAB no crackles, wheeze, rhonchi Ext: 1+ edema Skin: warm, dry Neuro: walks with cane today Diabetic Foot Exam - Simple   Simple Foot Form Diabetic Foot exam was performed with the following findings: Yes 02/14/2020 11:01 AM  Visual Inspection No deformities, no ulcerations, no other skin breakdown bilaterally: Yes Sensation Testing See comments: Yes Pulse Check Posterior Tibialis and Dorsalis pulse intact bilaterally: Yes Comments Normal monofilament exam except mild loss of sensation left toes 1-4.         Assessment and Plan    #History of CVA/stroke-patient followed by Dr. Leonie Man in the past with last visit in 2018.  Patient remains on Plavix which we are prescribing   # Diabetes-typically well controlled with A1c under 7 S: Medication:None-diet controlled in past.  CBGs- does not have a meter Exercise and diet-  Has gained some weight and feels like  it makes her more tired. Using cane or walker . Overdoes the sugar Lab Results  Component Value Date   HGBA1C 6.8 (H) 08/10/2019   HGBA1C 6.0 (A) 12/29/2018   HGBA1C 6.6 (H) 08/04/2018  A/P: hopefully controlled- updatea a1c today. At her age unlikely to start meds unless a1c above 7.5 perhaps.  -has some exercise classes at friends home encouraged her to try  #hyperlipidemia-LDL goal under 31 with history of stroke  S: Medication: Atorvastatin 20mg  once a week Lab Results  Component Value Date   CHOL 168 08/10/2019   HDL 73.60 08/10/2019   LDLCALC 70  08/10/2019   LDLDIRECT 69.0 01/28/2018   TRIG 119.0 08/10/2019   CHOLHDL 2 08/10/2019   A/P: LDL has been right at goal 70 or less-continue current medication and recheck lipids next visit  #hypertension S: medication: Cardizem 240mg  extended release, HCTZ 25Mg  A/P:  Stable. Continue current medications.    #Chronic kidney disease stage III S: GFR is typically in the 40s range- last visit in 50s  -Patient knows to avoid NSAIDs . Does extra strength tylenol as needed A/P: Hopefully controlled-update GFR today   # GERD-history of peptic ulcer.  Failed H2 blocker trial S: Medication: Pantoprazole 20 mg  A/P: Reasonable control-continue current medication  #declines flu shot. Did get covid 19 booster. Has declined pneumonia shots.   Recommended follow up: Return in about 6 months (around 08/14/2020) for physical or sooner if needed. Future Appointments  Date Time Provider [place]  02/19/2021 10:15 AM [facility] HE[facility] CO[facility]   Lab/Order associations:   ICD-10-CM   1. Type 2 diabetes mellitus with stage 3a chronic kidney disease, without long-term current use of insulin (HCC)  E11.22 Hemoglobin A1c   V40.08 COMPLETE METABOLIC PANEL WITH GFR    Microalbumin / creatinine urine ratio  2. Hypertension associated with diabetes ([place])  E11.59    I15.2   3. Gastroesophageal reflux disease without esophagitis  K21.9   4. Hyperlipidemia associated with type 2 diabetes mellitus ([place])  E11.69    E78.5   5. Stage 3a chronic kidney disease (HCC)  N18.31    Return precautions advised.  Garret Reddish, MD

## 2020-02-13 NOTE — Patient Instructions (Addendum)
Health Maintenance Due  Topic Date Due  . OPHTHALMOLOGY EXAM Was done early spring of 2021. Sign release of information at the check out desk for Last diabetic eye exam Never done   Recommended follow up: Return in about 6 months (around 08/14/2020) for physical or sooner if needed.   Please stop by lab before you go If you have mychart- we will send your results within 3 business days of [place] receiving them.  If you do not have mychart- we will call you about results within 5 business days of [place] receiving them.  *please note we are currently using [facility] which has a longer processing time than [place] typically so labs may not come back as quickly as in the past *please also note that you will see labs on mychart as soon as they post. I will later go in and write notes on them- will say "notes from Dr. Yong Channel"

## 2020-02-14 ENCOUNTER — Encounter: Payer: Self-pay | Admitting: Family Medicine

## 2020-02-14 ENCOUNTER — Other Ambulatory Visit: Payer: Self-pay

## 2020-02-14 ENCOUNTER — Ambulatory Visit: Payer: Medicare Other | Admitting: Family Medicine

## 2020-02-14 VITALS — BP 122/66 | HR 74 | Temp 98.2°F | Resp 18 | Ht 63.0 in | Wt 168.0 lb

## 2020-02-14 DIAGNOSIS — N1831 Chronic kidney disease, stage 3a: Secondary | ICD-10-CM

## 2020-02-14 DIAGNOSIS — K219 Gastro-esophageal reflux disease without esophagitis: Secondary | ICD-10-CM | POA: Diagnosis not present

## 2020-02-14 DIAGNOSIS — E1159 Type 2 diabetes mellitus with other circulatory complications: Secondary | ICD-10-CM | POA: Diagnosis not present

## 2020-02-14 DIAGNOSIS — E785 Hyperlipidemia, unspecified: Secondary | ICD-10-CM

## 2020-02-14 DIAGNOSIS — E1169 Type 2 diabetes mellitus with other specified complication: Secondary | ICD-10-CM | POA: Diagnosis not present

## 2020-02-14 DIAGNOSIS — E1122 Type 2 diabetes mellitus with diabetic chronic kidney disease: Secondary | ICD-10-CM | POA: Diagnosis not present

## 2020-02-14 DIAGNOSIS — I152 Hypertension secondary to endocrine disorders: Secondary | ICD-10-CM

## 2020-02-15 LAB — COMPLETE METABOLIC PANEL WITH GFR
AG Ratio: 1.2 (calc) (ref 1.0–2.5)
ALT: 16 U/L (ref 6–29)
AST: 17 U/L (ref 10–35)
Albumin: 3.9 g/dL (ref 3.6–5.1)
Alkaline phosphatase (APISO): 114 U/L (ref 37–153)
BUN/Creatinine Ratio: 16 (calc) (ref 6–22)
BUN: 15 mg/dL (ref 7–25)
CO2: 29 mmol/L (ref 20–32)
Calcium: 9.6 mg/dL (ref 8.6–10.4)
Chloride: 103 mmol/L (ref 98–110)
Creat: 0.95 mg/dL — ABNORMAL HIGH (ref 0.60–0.88)
GFR, Est African American: 59 mL/min/{1.73_m2} — ABNORMAL LOW (ref >=60)
GFR, Est Non African American: 51 mL/min/{1.73_m2} — ABNORMAL LOW (ref >=60)
Globulin: 3.3 g/dL (calc) (ref 1.9–3.7)
Glucose, Bld: 112 mg/dL — ABNORMAL HIGH (ref 65–99)
Potassium: 4 mmol/L (ref 3.5–5.3)
Sodium: 140 mmol/L (ref 135–146)
Total Bilirubin: 0.5 mg/dL (ref 0.2–1.2)
Total Protein: 7.2 g/dL (ref 6.1–8.1)

## 2020-02-15 LAB — MICROALBUMIN / CREATININE URINE RATIO
Creatinine, Urine: 170 mg/dL (ref 20–275)
Microalb Creat Ratio: 8 mcg/mg creat (ref <30)
Microalb, Ur: 1.4 mg/dL

## 2020-02-15 LAB — HEMOGLOBIN A1C
Hgb A1c MFr Bld: 6.6 % of total Hgb — ABNORMAL HIGH (ref <5.7)
Mean Plasma Glucose: 143 mg/dL
eAG (mmol/L): 7.9 mmol/L

## 2020-08-02 LAB — HM DIABETES EYE EXAM

## 2020-08-03 ENCOUNTER — Encounter: Payer: Self-pay | Admitting: Family Medicine

## 2020-08-14 ENCOUNTER — Other Ambulatory Visit: Payer: Self-pay

## 2020-08-14 ENCOUNTER — Encounter: Payer: Self-pay | Admitting: Family Medicine

## 2020-08-14 ENCOUNTER — Ambulatory Visit (INDEPENDENT_AMBULATORY_CARE_PROVIDER_SITE_OTHER): Payer: Medicare Other | Admitting: Family Medicine

## 2020-08-14 VITALS — BP 136/70 | HR 74 | Temp 98.4°F | Ht 63.0 in | Wt 159.0 lb

## 2020-08-14 DIAGNOSIS — I152 Hypertension secondary to endocrine disorders: Secondary | ICD-10-CM | POA: Diagnosis not present

## 2020-08-14 DIAGNOSIS — Z0000 Encounter for general adult medical examination without abnormal findings: Secondary | ICD-10-CM

## 2020-08-14 DIAGNOSIS — E1122 Type 2 diabetes mellitus with diabetic chronic kidney disease: Secondary | ICD-10-CM

## 2020-08-14 DIAGNOSIS — E1159 Type 2 diabetes mellitus with other circulatory complications: Secondary | ICD-10-CM

## 2020-08-14 DIAGNOSIS — H353210 Exudative age-related macular degeneration, right eye, stage unspecified: Secondary | ICD-10-CM

## 2020-08-14 DIAGNOSIS — N1831 Chronic kidney disease, stage 3a: Secondary | ICD-10-CM

## 2020-08-14 LAB — CBC WITH DIFFERENTIAL/PLATELET
Basophils Absolute: 0.1 10*3/uL (ref 0.0–0.1)
Basophils Relative: 0.7 % (ref 0.0–3.0)
Eosinophils Absolute: 0.3 10*3/uL (ref 0.0–0.7)
Eosinophils Relative: 4.1 % (ref 0.0–5.0)
HCT: 43.7 % (ref 36.0–46.0)
Hemoglobin: 14.9 g/dL (ref 12.0–15.0)
Lymphocytes Relative: 27.7 % (ref 12.0–46.0)
Lymphs Abs: 2.1 10*3/uL (ref 0.7–4.0)
MCHC: 34 g/dL (ref 30.0–36.0)
MCV: 88.3 fl (ref 78.0–100.0)
Monocytes Absolute: 0.6 10*3/uL (ref 0.1–1.0)
Monocytes Relative: 8 % (ref 3.0–12.0)
Neutro Abs: 4.5 10*3/uL (ref 1.4–7.7)
Neutrophils Relative %: 59.5 % (ref 43.0–77.0)
Platelets: 364 10*3/uL (ref 150.0–400.0)
RBC: 4.95 Mil/uL (ref 3.87–5.11)
RDW: 13.1 % (ref 11.5–15.5)
WBC: 7.6 10*3/uL (ref 4.0–10.5)

## 2020-08-14 LAB — HEMOGLOBIN A1C: Hgb A1c MFr Bld: 6.4 % (ref 4.6–6.5)

## 2020-08-14 NOTE — Progress Notes (Addendum)
Phone [phone]   Subjective:  Patient presents today for their annual physical. Chief complaint-noted.   See problem oriented charting- ROS- full  review of systems was completed and negative except for some joint pain  The following were reviewed and entered/updated in epic: Past Medical History:  Diagnosis Date  . Adenomatous polyps   . Arthritis   . Cerebrovascular accident ([place])   . Cervical cancer ([place])    Cervical Ca-no radiation or chemo  . Diverticulosis   . Edema   . Effusion of ankle and foot joint   . GERD (gastroesophageal reflux disease)   . History of UTI   . Hypertension   . Macular degeneration   . Observation for suspected cardiovascular disease   . Other abnormal blood chemistry   . Peptic ulcer disease   . Pyloric stenosis   . S/P total abdominal hysterectomy   . Special screening for malignant neoplasm of colon    Patient Active Problem List   Diagnosis Date Noted  . Type 2 diabetes mellitus with diabetic chronic kidney disease ([place]) 06/28/2014    Priority: High  . ACP (advance care planning) 12/17/2012    Priority: High  . History of CVA in adulthood     Priority: High  . Exudative age-related macular degeneration of right eye, unspecified stage ([place]) 08/10/2019    Priority: Medium  . CKD (chronic kidney disease), stage III ([place]) 07/29/2017    Priority: Medium  . Hyperlipidemia associated with type 2 diabetes mellitus ([place]) 06/28/2014    Priority: Medium  . Hypertension associated with diabetes ([place]) 12/02/2006    Priority: Medium  . Low back pain without sciatica 08/04/2018    Priority: Low  . Routine health maintenance 08/25/2011    Priority: Low  . GERD 12/02/2006    Priority: Low   Past Surgical History:  Procedure Laterality Date  . ABDOMINAL HYSTERECTOMY     including cervix  . CATARACT EXTRACTION  2008   Dr Kathrin Penner  . cyst on right hand removed    . TEE WITHOUT CARDIOVERSION  11/20/2011   TEE with bubble study-PFO     Family History  Problem Relation Age of Onset  . Heart failure Mother   . Hypertension Mother   . Coronary artery disease Mother   . Hypertension Sister   . Stroke Brother   . Hypertension Maternal Grandmother   . Stroke Maternal Grandmother   . Hypertension Maternal Grandfather     Medications- reviewed and updated Current Outpatient Medications  Medication Sig Dispense Refill  . atorvastatin (LIPITOR) 20 MG tablet Take 1 tablet (20 mg total) by mouth once a week. 13 tablet 3  . Bisacodyl (DULCOLAX PO) Take 1 tablet by mouth as needed.    . clopidogrel (PLAVIX) 75 MG tablet TAKE (1) TABLET DAILY. 91 tablet 3  . diltiazem (CARDIZEM CD) 240 MG 24 hr capsule Take 1 capsule (240 mg total) by mouth daily. 91 capsule 3  . hydrochlorothiazide (HYDRODIURIL) 25 MG tablet Take 1 tablet (25 mg total) by mouth daily. 91 tablet 3  . Multiple Vitamins-Minerals (CENTRUM SILVER) tablet Take 1 tablet by mouth daily.    . pantoprazole (PROTONIX) 20 MG tablet Take 1 tablet (20 mg total) by mouth daily. 90 tablet 3   No current facility-administered medications for this visit.    Allergies-reviewed and updated No Known Allergies  Social History   Social History Narrative   HSG, [employer]. Married '50- widowed after 63 years.   Lives in friends  home [place]- 2 rooms (living room and bedroom and 2 balconies)      On the list [facility]   ACP/End of life: No CPR, no heroic/futile treatment, i.e. prolonged ICU care. HCPA sister [name] tele [phone].      Patient is retired from [employer] after 35 years   Right handed.   Caffeine two cups of coffee daily. Very Rare coke.      Hobbies: play golf, play bridge   Objective  Objective:  BP 136/70   Pulse 74   Temp 98.4 F (36.9 C) (Temporal)   Ht 5\' 3"  (1.6 m)   Wt 159 lb (72.1 kg)   SpO2 95%   BMI 28.17 kg/m  Gen: NAD, resting comfortably HEENT: Mucous membranes are moist. Oropharynx normal, TMs and canals are normal after  removal of hearing aids. No lymphadenopathy   Neck: no thyromegaly CV: RRR no murmurs rubs or gallops, no carotid bruit Lungs: CTAB no crackles, wheeze, rhonchi Abdomen: soft/nontender/nondistended/normal bowel sounds. No rebound or guarding.  Ext: trace edema, intact distal pulses  Skin: warm, dry Neuro: grossly normal, moves all extremities, PERRLA   Assessment and Plan   87 y.o. female presenting for annual physical.  Health Maintenance counseling: 1. Anticipatory guidance: Patient counseled regarding regular dental exams -q6 months, eye exams-history of macular degeneration-following up closely with ophthalmology- seeing Dr. Gillian Scarce now and also sees Dr. Jule Ser for injections but thankfully with new injections has been able to space these out as her next injection will be August, avoiding smoking and second hand smoke , limiting alcohol to 1 beverage per day-drinks wine rarely .   2. Risk factor reduction:  Advised patient of need for regular exercise and diet rich and fruits and vegetables to reduce risk of heart attack and stroke. Exercise- not participating in exercise but plans to complete more of this soon. Currently [facility] a walker at the [facility]. Diet- Trying to lose weight by cutting out unhealthy diet- down several lbs but still in reasonable range at her age (BMI 28) Wt Readings from Last 3 Encounters:  08/14/20 159 lb (72.1 kg)  02/14/20 168 lb (76.2 kg)  02/11/20 169 lb 3.2 oz (76.7 kg)  3. Immunizations/screenings/ancillary studies-declines Prevnar 20 vaccination. Patient encouraged to have first dose of Shingrix vaccine today-Declines. Up to date on all  COVID vaccinations . Typically declines flu shot Immunization History  Administered Date(s) Administered  . Influenza-Unspecified 01/09/2018  . Moderna Sars-Covid-2 Vaccination 05/02/2019, 05/10/2019, 01/18/2020   4. Cervical cancer screening-past age based screening recommendations 5. Breast cancer screening- past age  based screening recommendations  6. Colon cancer screening - past age based screening recommendations.  No blood in the stool or melena 7. Skin cancer screening- no dermatologist-she had declined referral in the past.  8. Osteoporosis screening at 58- declines DEXA- states she would not take medicine even if had osteoporosis. Wants to minimize medicines -Former smoker-quit in 1950 no further screenings based on smoking required  Status of chronic or acute concerns   #SocialUpdates: Reading books and completing crossword puzzles.  Enjoying friends home still  #History of CVA/stroke-patient followed by Dr. Leonie Man in the past with last visit in 2018. Patient remains on Plavix which we are prescribing #  Confusion S:Patient had one incident of confusion and disorientation while driving her car to an event. Lasted for one minute and then resolved. Taking her Plavix 75 mg consistently and has not missed a dose. She confirms that this scared her some due  to her stroke history but she has not had any other episodes like this since that time. A/P: We discussed possibility that this could have been a TIA.  With how short-lived it was I am hopeful it was not.  We are unknown make sure lipids are well controlled and continue Plavix for now-if she has recurrent episodes she will let us know  # Diabetes-typically well controlled with A1c under 7 S: Medication:None-diet controlled CBGs-does not have a meter Lab Results  Component Value Date   HGBA1C 6.6 (H) 02/14/2020   HGBA1C 6.8 (H) 08/10/2019   HGBA1C 6.0 (A) 12/29/2018   A/P: Hopefully stable-update A1c with labs today.  For now continue current medications  #hyperlipidemia-LDL goal under 70 with history of stroke  S: Medication: Atorvastatin 20mg  once a week Lab Results  Component Value Date   CHOL 168 08/10/2019   HDL 73.60 08/10/2019   LDLCALC 70 08/10/2019   LDLDIRECT 69.0 01/28/2018   TRIG 119.0 08/10/2019   CHOLHDL 2 08/10/2019   A/P:Patient was right at goal last year-update full lipid panel today- LDL goal 70 or less with stroke history  #hypertension S: medication: Cardizem 240mg  extended release, HCTZ 25Mg  Home readings #s: not checking at home BP Readings from Last 3 Encounters:  08/14/20 136/70  02/14/20 122/66  02/11/20 130/64  A/P: Stable. Continue current medications.   #Chronic kidney disease stage III S: GFR is typically in the 40s or 50s range -Patient knows to avoid NSAIDs but has not been able to tolerate being off of these-previously using Aleve once a month and had recommended Tylenol instead-She has tried Tylenol but it has not help.  A/P: Hopefully stable even with ongoing Aleve use although only once or twice a month-monitor CMP with labs today  # GERD-history of peptic ulcer. Failed H2 blocker trial S: Medication: Pantoprazole 20 mg-not ideal for CKD stage III B12 levels related to PPI use: Last check August 10, 2019 A/P: Thankfully well controlled-since she has failed H2 blockers we will continue current medication    Recommended follow [id number] in about 6 months (around 02/13/2021) for follow up- or sooner if needed. Future Appointments  Date Time Provider [place]  02/19/2021 10:15 AM [facility] HE[facility] COACH [facility]    Chief Complaint  Patient presents with  . Annual Exam    Fasting    Lab/Order associations:fasting   ICD-10-CM   1. Preventative health care  Z00.00   2. Hypertension associated with diabetes ([place])  E11.59 Comprehensive metabolic panel   G38.7 Lipid panel    CBC with Differential/Platelet  3. Type 2 diabetes mellitus with stage 3a chronic kidney disease, without long-term current use of insulin (HCC)  E11.22 Hemoglobin A1c   N18.31 Comprehensive metabolic panel    Lipid panel    CBC with Differential/Platelet  4. Exudative age-related macular degeneration of right eye, unspecified stage ([place]) Chronic H35.3210     I,Alexis Bryant,acting as a scribe  for Garret Reddish, MD.,have documented all relevant documentation on the behalf of Garret Reddish, MD,as directed by  Garret Reddish, MD while in the presence of Garret Reddish, MD.   I, Garret Reddish, MD, have reviewed all documentation for this visit. The documentation on 08/14/20 for the exam, diagnosis, procedures, and orders are all accurate and complete.    Return precautions advised.  Garret Reddish, MD

## 2020-08-14 NOTE — Patient Instructions (Addendum)
Please stop by lab before you go If you have mychart- we will send your results within 3 business days of [place] receiving them.  If you do not have mychart- we will call you about results within 5 business days of [place] receiving them.  *please also note that you will see labs on mychart as soon as they post. I will later go in and write notes on them- will say "notes from Dr. Yong Channel"  I would love to see you get to the exercise classes at least once a week or more if possible!    Recommended follow up: Return in about 6 months (around 02/13/2021) for follow up- or sooner if needed.

## 2020-08-15 LAB — COMPREHENSIVE METABOLIC PANEL
ALT: 14 U/L (ref 0–35)
AST: 16 U/L (ref 0–37)
Albumin: 4.2 g/dL (ref 3.5–5.2)
Alkaline Phosphatase: 114 U/L (ref 39–117)
BUN: 21 mg/dL (ref 6–23)
CO2: 30 mEq/L (ref 19–32)
Calcium: 9.7 mg/dL (ref 8.4–10.5)
Chloride: 100 mEq/L (ref 96–112)
Creatinine, Ser: 1.03 mg/dL (ref 0.40–1.20)
GFR: 46.23 mL/min — ABNORMAL LOW (ref >60.00)
Glucose, Bld: 115 mg/dL — ABNORMAL HIGH (ref 70–99)
Potassium: 3.7 mEq/L (ref 3.5–5.1)
Sodium: 138 mEq/L (ref 135–145)
Total Bilirubin: 0.4 mg/dL (ref 0.2–1.2)
Total Protein: 7.6 g/dL (ref 6.0–8.3)

## 2020-08-15 LAB — LIPID PANEL
Cholesterol: 173 mg/dL (ref 0–200)
HDL: 80 mg/dL (ref >39.00)
LDL Cholesterol: 70 mg/dL (ref 0–99)
NonHDL: 92.96
Total CHOL/HDL Ratio: 2
Triglycerides: 117 mg/dL (ref 0.0–149.0)
VLDL: 23.4 mg/dL (ref 0.0–40.0)

## 2020-09-15 ENCOUNTER — Other Ambulatory Visit: Payer: Self-pay | Admitting: Family Medicine

## 2020-10-16 ENCOUNTER — Other Ambulatory Visit: Payer: Self-pay | Admitting: Family Medicine

## 2021-02-05 NOTE — Progress Notes (Signed)
Phone [phone] In person visit   Subjective:   [name] is a 87 y.o. year old very pleasant female patient who presents for/with See problem oriented charting Chief Complaint  Patient presents with   Follow-up    6 month follow-up [place]     This visit occurred during the SARS-CoV-2 public health emergency.  Safety protocols were in place, including screening questions prior to the visit, additional usage of staff PPE, and extensive cleaning of exam room while observing appropriate contact time as indicated for disinfecting solutions.   Past Medical History-  Patient Active Problem List   Diagnosis Date Noted   Type 2 diabetes mellitus with diabetic chronic kidney disease ([place]) 06/28/2014    Priority: High   ACP (advance care planning) 12/17/2012    Priority: High   History of CVA in adulthood     Priority: High   Exudative age-related macular degeneration of right eye, unspecified stage ([place]) 08/10/2019    Priority: Medium    CKD (chronic kidney disease), stage III ([place]) 07/29/2017    Priority: Medium    Hyperlipidemia associated with type 2 diabetes mellitus ([place]) 06/28/2014    Priority: Medium    Hypertension associated with diabetes ([place]) 12/02/2006    Priority: Medium    Low back pain without sciatica 08/04/2018    Priority: Low   Routine health maintenance 08/25/2011    Priority: Low   GERD 12/02/2006    Priority: Low    Medications- reviewed and updated Current Outpatient Medications  Medication Sig Dispense Refill   atorvastatin (LIPITOR) 20 MG tablet TAKE 1 TABLET ONCE A WEEK. 13 tablet 3   Bisacodyl (DULCOLAX PO) Take 1 tablet by mouth as needed.     clopidogrel (PLAVIX) 75 MG tablet TAKE (1) TABLET DAILY. 90 tablet 3   diltiazem (CARDIZEM CD) 240 MG 24 hr capsule TAKE 1 CAPSULE DAILY. 90 capsule 3   hydrochlorothiazide (HYDRODIURIL) 25 MG tablet TAKE 1 TABLET ONCE DAILY. 90 tablet 3   Multiple Vitamins-Minerals (CENTRUM SILVER) tablet Take 1  tablet by mouth daily.     pantoprazole (PROTONIX) 20 MG tablet TAKE 1 TABLET EACH DAY. 90 tablet 3   No current facility-administered medications for this visit.     Objective:  BP 130/60   Pulse 76   Temp 98 F (36.7 C) (Temporal)   Ht 5\' 2"  (1.575 m)   Wt 163 lb 8 oz (74.2 kg)   SpO2 95%   BMI 29.90 kg/m  Gen: NAD, resting comfortably CV: RRR no murmurs rubs or gallops Lungs: CTAB no crackles, wheeze, rhonchi Abdomen: soft/nontender/nondistended/normal bowel sounds. No rebound or guarding.  Ext: trace edema Skin: warm, dry Neuro:  using rollator walker now     Assessment and Plan   #Social today-continues to enjoy friends home   #History of CVA/stroke-patient followed by Dr. Leonie Man in the past with last visit in 2018. Patient remained on Plavix which we were prescribing  # Diabetes-typically well controlled with A1c under 7 S: Medication:None-diet controlled Lab Results  Component Value Date   HGBA1C 6.4 08/14/2020   HGBA1C 6.6 (H) 02/14/2020   HGBA1C 6.8 (H) 08/10/2019   A/P: Doing well without medication typically-update A1c with labs today  #hyperlipidemia-LDL goal under 70 with history of stroke  S: Medication: Atorvastatin 20mg  once a week Lab Results  Component Value Date   CHOL 173 08/14/2020   HDL 80.00 08/14/2020   LDLCALC 70 08/14/2020   LDLDIRECT 69.0 01/28/2018   TRIG 117.0  08/14/2020   CHOLHDL 2 08/14/2020   A/P: Lipids right at goal last visit-update full lipid panel next year  #hypertension S: medication: Cardizem 240mg  daily, HCTZ 25Mg  daily BP Readings from Last 3 Encounters:  02/12/21 130/60  08/14/20 136/70  02/14/20 122/66  A/P:  Controlled. Continue current medications.    #Chronic kidney disease stage III S: GFR is typically in the 40s range  -Patient knows to avoid NSAIDs - but does report sparing aleve- prefer tylenol arthritis- doesn't help- also warned of plavix and renal risk A/P:  hopefully stable- update CMP today.  Continue current meds for now   # GERD-history of peptic ulcer. Failed H2 blocker trial S: Medication: Pantoprazole 20 mg daily A/P: Well-controlled and has failed H2 blockers in the past-continue current medications and update B12 level  # patient dealing with right hip pain- shot not helpful in past a few years ago. Hip has bothered her for 15 years and was told to consider surgery even 15 years ago- has really become life limiting- even ard getting into the shower. She is considering seeing a Psychologist, sport and exercise and if firms up her mind will call us first of the year and we will refer her.  - certainly would have some risk to surgery and would likely need cardiac clearance from cardiology- she is in agreement to this - she struggles to even make up her bed due to pain- this has significantly worsened and is life limiting for her -she was still golfing 4 years ago. Shower is extremely painful  #declines pneumonia and shingles shot. Declines bone density  Recommended follow up: Return for follow-up or sooner if needed.. Future Appointments  Date Time Provider [place]  02/19/2021 10:15 AM [facility] HEALTH COACH [facility]   Lab/Order associations: Fasting   ICD-10-CM   1. Hypertension associated with diabetes ([place])  E11.59 CBC with Differential/Platelet   I15.2 Comprehensive metabolic panel    2. Type 2 diabetes mellitus with stage 3a chronic kidney disease, without long-term current use of insulin (HCC)  E11.22 CBC with Differential/Platelet   N18.31 Comprehensive metabolic panel    Hemoglobin A1c    3. Hyperlipidemia associated with type 2 diabetes mellitus (HCC)  E11.69 CBC with Differential/Platelet   E78.5 Comprehensive metabolic panel    4. History of CVA in adulthood  Z39.73     5. Stage 3a chronic kidney disease (HCC)  N18.31     6. Gastroesophageal reflux disease without esophagitis  K21.9     7. High risk medication use  Z79.899 B12    8. Arthritis of right hip  M16.11  Ambulatory referral to Orthopedic Surgery     I,Jada Bradford,acting as a scribe for Garret Reddish, MD.,have documented all relevant documentation on the behalf of Garret Reddish, MD,as directed by  Garret Reddish, MD while in the presence of Garret Reddish, MD.   I, Garret Reddish, MD, have reviewed all documentation for this visit. The documentation on 02/12/21 for the exam, diagnosis, procedures, and orders are all accurate and complete.   Return precautions advised.  Garret Reddish, MD

## 2021-02-12 ENCOUNTER — Ambulatory Visit: Payer: Medicare Other | Admitting: Family Medicine

## 2021-02-12 ENCOUNTER — Other Ambulatory Visit: Payer: Self-pay

## 2021-02-12 ENCOUNTER — Encounter: Payer: Self-pay | Admitting: Family Medicine

## 2021-02-12 VITALS — BP 130/60 | HR 76 | Temp 98.0°F | Ht 62.0 in | Wt 163.5 lb

## 2021-02-12 DIAGNOSIS — E1169 Type 2 diabetes mellitus with other specified complication: Secondary | ICD-10-CM

## 2021-02-12 DIAGNOSIS — E1159 Type 2 diabetes mellitus with other circulatory complications: Secondary | ICD-10-CM

## 2021-02-12 DIAGNOSIS — I10 Essential (primary) hypertension: Secondary | ICD-10-CM

## 2021-02-12 DIAGNOSIS — N1831 Chronic kidney disease, stage 3a: Secondary | ICD-10-CM

## 2021-02-12 DIAGNOSIS — Z79899 Other long term (current) drug therapy: Secondary | ICD-10-CM

## 2021-02-12 DIAGNOSIS — I152 Hypertension secondary to endocrine disorders: Secondary | ICD-10-CM | POA: Diagnosis not present

## 2021-02-12 DIAGNOSIS — M1611 Unilateral primary osteoarthritis, right hip: Secondary | ICD-10-CM

## 2021-02-12 DIAGNOSIS — E1122 Type 2 diabetes mellitus with diabetic chronic kidney disease: Secondary | ICD-10-CM | POA: Diagnosis not present

## 2021-02-12 DIAGNOSIS — Z8673 Personal history of transient ischemic attack (TIA), and cerebral infarction without residual deficits: Secondary | ICD-10-CM

## 2021-02-12 DIAGNOSIS — E785 Hyperlipidemia, unspecified: Secondary | ICD-10-CM

## 2021-02-12 DIAGNOSIS — K219 Gastro-esophageal reflux disease without esophagitis: Secondary | ICD-10-CM

## 2021-02-12 LAB — CBC WITH DIFFERENTIAL/PLATELET
Basophils Absolute: 0.1 10*3/uL (ref 0.0–0.1)
Basophils Relative: 0.9 % (ref 0.0–3.0)
Eosinophils Absolute: 0.2 10*3/uL (ref 0.0–0.7)
Eosinophils Relative: 3.6 % (ref 0.0–5.0)
HCT: 40.5 % (ref 36.0–46.0)
Hemoglobin: 13.8 g/dL (ref 12.0–15.0)
Lymphocytes Relative: 21.3 % (ref 12.0–46.0)
Lymphs Abs: 1.4 10*3/uL (ref 0.7–4.0)
MCHC: 34.1 g/dL (ref 30.0–36.0)
MCV: 88.3 fl (ref 78.0–100.0)
Monocytes Absolute: 0.7 10*3/uL (ref 0.1–1.0)
Monocytes Relative: 10.9 % (ref 3.0–12.0)
Neutro Abs: 4 10*3/uL (ref 1.4–7.7)
Neutrophils Relative %: 63.3 % (ref 43.0–77.0)
Platelets: 360 10*3/uL (ref 150.0–400.0)
RBC: 4.59 Mil/uL (ref 3.87–5.11)
RDW: 12.7 % (ref 11.5–15.5)
WBC: 6.4 10*3/uL (ref 4.0–10.5)

## 2021-02-12 LAB — COMPREHENSIVE METABOLIC PANEL
ALT: 16 U/L (ref 0–35)
AST: 17 U/L (ref 0–37)
Albumin: 4.2 g/dL (ref 3.5–5.2)
Alkaline Phosphatase: 118 U/L — ABNORMAL HIGH (ref 39–117)
BUN: 22 mg/dL (ref 6–23)
CO2: 28 mEq/L (ref 19–32)
Calcium: 9.9 mg/dL (ref 8.4–10.5)
Chloride: 100 mEq/L (ref 96–112)
Creatinine, Ser: 1.14 mg/dL (ref 0.40–1.20)
GFR: 40.79 mL/min — ABNORMAL LOW (ref >60.00)
Glucose, Bld: 118 mg/dL — ABNORMAL HIGH (ref 70–99)
Potassium: 3.8 mEq/L (ref 3.5–5.1)
Sodium: 138 mEq/L (ref 135–145)
Total Bilirubin: 0.5 mg/dL (ref 0.2–1.2)
Total Protein: 7.6 g/dL (ref 6.0–8.3)

## 2021-02-12 LAB — VITAMIN B12: Vitamin B-12: 622 pg/mL (ref 211–911)

## 2021-02-12 LAB — HEMOGLOBIN A1C: Hgb A1c MFr Bld: 6.5 % (ref 4.6–6.5)

## 2021-02-12 NOTE — Patient Instructions (Addendum)
We will call you within two weeks about your referral to Dr.Allusio. If you do not hear within 2 weeks, give [place] a call.   Please stop by lab before you go If you have mychart- we will send your results within 3 business days of [place] receiving them.  If you do not have mychart- we will call you about results within 5 business days of [place] receiving them.  *please also note that you will see labs on mychart as soon as they post. I will later go in and write notes on them- will say "notes from Dr. Yong Channel"  Recommended follow up: Return in about 6 months (around 08/13/2021) for physical or sooner if needed..  Happy Holidays and hope you have a wonderful Birthday!

## 2021-02-19 ENCOUNTER — Ambulatory Visit: Payer: Medicare Other

## 2021-07-30 ENCOUNTER — Telehealth: Payer: Self-pay | Admitting: Family Medicine

## 2021-07-30 NOTE — Telephone Encounter (Signed)
Copied from [place] [id number]. Topic: Medicare AWV >> Jul 30, 2021 11:05 AM Harris-Coley, Hannah Beat wrote: Reason for CRM: Left message for patient to schedule Annual Wellness Visit.  Please schedule with Nurse Health Advisor Charlott Rakes, RN at [facility].  Please call [phone] ask for [facility]

## 2021-08-14 ENCOUNTER — Emergency Department (HOSPITAL_BASED_OUTPATIENT_CLINIC_OR_DEPARTMENT_OTHER): Payer: Medicare Other | Admitting: Radiology

## 2021-08-14 ENCOUNTER — Emergency Department (HOSPITAL_BASED_OUTPATIENT_CLINIC_OR_DEPARTMENT_OTHER): Payer: Medicare Other

## 2021-08-14 ENCOUNTER — Emergency Department (HOSPITAL_BASED_OUTPATIENT_CLINIC_OR_DEPARTMENT_OTHER)
Admission: EM | Admit: 2021-08-14 | Discharge: 2021-08-14 | Disposition: A | Discharge status: Home / Self Care | Payer: Medicare Other | Attending: Emergency Medicine | Admitting: Emergency Medicine

## 2021-08-14 ENCOUNTER — Encounter (HOSPITAL_BASED_OUTPATIENT_CLINIC_OR_DEPARTMENT_OTHER): Payer: Self-pay

## 2021-08-14 ENCOUNTER — Other Ambulatory Visit: Payer: Self-pay

## 2021-08-14 DIAGNOSIS — W1809XA Striking against other object with subsequent fall, initial encounter: Secondary | ICD-10-CM | POA: Diagnosis not present

## 2021-08-14 DIAGNOSIS — Z7902 Long term (current) use of antithrombotics/antiplatelets: Secondary | ICD-10-CM | POA: Diagnosis not present

## 2021-08-14 DIAGNOSIS — S52181A Other fracture of upper end of right radius, initial encounter for closed fracture: Secondary | ICD-10-CM | POA: Insufficient documentation

## 2021-08-14 DIAGNOSIS — S43401A Unspecified sprain of right shoulder joint, initial encounter: Secondary | ICD-10-CM | POA: Diagnosis not present

## 2021-08-14 DIAGNOSIS — Z79899 Other long term (current) drug therapy: Secondary | ICD-10-CM | POA: Diagnosis not present

## 2021-08-14 DIAGNOSIS — S0990XA Unspecified injury of head, initial encounter: Secondary | ICD-10-CM | POA: Insufficient documentation

## 2021-08-14 DIAGNOSIS — S60051A Contusion of right little finger without damage to nail, initial encounter: Secondary | ICD-10-CM | POA: Diagnosis not present

## 2021-08-14 DIAGNOSIS — S52101A Unspecified fracture of upper end of right radius, initial encounter for closed fracture: Secondary | ICD-10-CM

## 2021-08-14 DIAGNOSIS — Y9301 Activity, walking, marching and hiking: Secondary | ICD-10-CM | POA: Insufficient documentation

## 2021-08-14 DIAGNOSIS — S51011A Laceration without foreign body of right elbow, initial encounter: Secondary | ICD-10-CM

## 2021-08-14 DIAGNOSIS — W19XXXA Unspecified fall, initial encounter: Secondary | ICD-10-CM

## 2021-08-14 DIAGNOSIS — S59901A Unspecified injury of right elbow, initial encounter: Secondary | ICD-10-CM | POA: Diagnosis present

## 2021-08-14 MED ORDER — ACETAMINOPHEN 500 MG PO TABS
1000.0000 mg | ORAL_TABLET | Freq: Once | ORAL | Status: AC
  Administered 2021-08-14: 1000 mg via ORAL
  Filled 2021-08-14: qty 2

## 2021-08-14 NOTE — Discharge Instructions (Addendum)
1.  Call [facility] tomorrow to schedule follow-up soon as possible for your elbow fracture.  Elevate your arm is much as possible.  Apply a well wrapped ice pack to the outside of the splint although do not get the splint wet.  You do have a minor skin tear under the splint.  This has a dressing and a padding on it.  Return to emergency department immediately if you are having significantly increasing pain under the splint, cool or blue fingers or any other concerning changes. 2.  Your CT scan of the head does not show any injury.  Return if you get a bad headache, feel confused have problems with your vision have vomiting or other concerning changes. 3.  Schedule a recheck with your family doctor to make sure all of your symptoms are improving or resolving. 4.  Take extra strength Tylenol every 6 hours for pain as needed.

## 2021-08-14 NOTE — ED Provider Notes (Signed)
[place] EMERGENCY DEPT Provider Note   CSN: [id number] Arrival date & time: 08/14/21  1708     History  Chief Complaint  Patient presents with   [name] is a 88 y.o. female.  HPI Patient takes daily Plavix.  She reports that she was out today and walking underneath a garage door.  She reports a garage door was slightly down and she knew she needed to duck but got distracted and lightly hit her head which caused her to lose her balance and fall backwards on her buttocks and right elbow.  Patient denies any loss of consciousness.  She denies any ongoing headache.  She reports she was able to pull herself back up with a chair she has been ambulatory.  She reports she went home to friend's home and an assistant cleaned and dressed her right elbow.  She reports however since she had some pain in her shoulder and elbow they insisted she come to the emergency department for x-rays.  Patient reports that she also probably dislocated her fifth digit on the right.  She reports it was out of place, so she pulled on it now is straight and it does not hurt anymore.    Home Medications Prior to Admission medications   Medication Sig Start Date End Date Taking? Authorizing Provider  atorvastatin (LIPITOR) 20 MG tablet TAKE 1 TABLET ONCE A WEEK. 10/17/20   Marin Olp, MD  Bisacodyl (DULCOLAX PO) Take 1 tablet by mouth as needed.    [provider]  clopidogrel (PLAVIX) 75 MG tablet TAKE (1) TABLET DAILY. 09/15/20   Marin Olp, MD  diltiazem (CARDIZEM CD) 240 MG 24 hr capsule TAKE 1 CAPSULE DAILY. 09/15/20   Marin Olp, MD  hydrochlorothiazide (HYDRODIURIL) 25 MG tablet TAKE 1 TABLET ONCE DAILY. 09/15/20   Marin Olp, MD  Multiple Vitamins-Minerals (CENTRUM SILVER) tablet Take 1 tablet by mouth daily.    [provider]  pantoprazole (PROTONIX) 20 MG tablet TAKE 1 TABLET EACH DAY. 09/15/20   Marin Olp, MD      Allergies     Patient has no known allergies.    Review of Systems   Review of Systems 10 Systems reviewed and negative except as per HPI Physical Exam Updated Vital Signs BP (!) 153/61   Pulse 69   Temp 98.2 F (36.8 C)   Resp 18   Ht '5\' 2"'$  (1.575 m)   Wt 73.9 kg   SpO2 97%   BMI 29.81 kg/m  Physical Exam Constitutional:      Comments: Patient is alert.  Mental status clear.  Excellent physical condition for age.  HENT:     Head: Normocephalic and atraumatic.     Mouth/Throat:     Mouth: Mucous membranes are moist.     Pharynx: Oropharynx is clear.  Eyes:     Extraocular Movements: Extraocular movements intact.     Conjunctiva/sclera: Conjunctivae normal.     Pupils: Pupils are equal, round, and reactive to light.  Neck:     Comments: No C-spine tenderness to palpation Cardiovascular:     Rate and Rhythm: Normal rate and regular rhythm.  Pulmonary:     Effort: Pulmonary effort is normal.     Breath sounds: Normal breath sounds.  Abdominal:     General: There is no distension.     Palpations: Abdomen is soft.     Tenderness: There is no abdominal tenderness. There is  no guarding.  Musculoskeletal:     Cervical back: Neck supple.     Comments: No midline C-spine tenderness to palpation.  No visible contusions abrasions to the back.  Nontender at the coccyx and buttocks.  Right elbow has a 2 cm skin tear.  No active bleeding.  No appearance of deformity of the elbow or shoulder.  Patient does have intact range of motion.  The left right digit is ecchymotic over the whole digit.  However it is straight and no significant swelling at this time.  Both lower extremities symmetric without any pain with range of motion.  Skin:    General: Skin is warm and dry.  Neurological:     General: No focal deficit present.     Mental Status: She is oriented to person, place, and time.     Motor: No weakness.     Coordination: Coordination normal.  Psychiatric:        Mood and Affect: Mood  normal.    ED Results / Procedures / Treatments   Labs (all labs ordered are listed, but only abnormal results are displayed) Labs Reviewed - No data to display  EKG None  Radiology DG Shoulder Right  Result Date: 08/14/2021 CLINICAL DATA:  Post fall with right shoulder pain. EXAM: RIGHT SHOULDER - 2+ VIEW COMPARISON:  None Available. FINDINGS: There is no evidence of fracture or dislocation. Mild acromioclavicular degenerative change with inferior spurring. Limited glenohumeral joint assessment on provided views. Soft tissues are unremarkable. IMPRESSION: No fracture or dislocation of the right shoulder. Mild acromioclavicular degenerative change. Electronically Signed   By: Keith Rake M.D.   On: 08/14/2021 19:15   DG Elbow Complete Right  Result Date: 08/14/2021 CLINICAL DATA:  Post fall with right elbow pain. EXAM: RIGHT ELBOW - COMPLETE 3+ VIEW COMPARISON:  None Available. FINDINGS: Suspected fracture of the radial head and neck, with associated elbow joint effusion. There is mild underlying osteoarthritis with spurring. IMPRESSION: Suspected fracture of the radial head and neck with associated elbow joint effusion. Electronically Signed   By: Keith Rake M.D.   On: 08/14/2021 19:14   CT Head Wo Contrast  Result Date: 08/14/2021 CLINICAL DATA:  Head trauma, moderate-severe 88 year old right into a garage door leading to fall.  Struck head. EXAM: CT HEAD WITHOUT CONTRAST TECHNIQUE: Contiguous axial images were obtained from the base of the skull through the vertex without intravenous contrast. RADIATION DOSE REDUCTION: This exam was performed according to the departmental dose-optimization program which includes automated exposure control, adjustment of the mA and/or kV according to patient size and/or use of iterative reconstruction technique. COMPARISON:  Head CT 11/18/2011 FINDINGS: Brain: Age related atrophy. No intracranial hemorrhage, mass effect, or midline shift. No  hydrocephalus. The basilar cisterns are patent. Remote lacunar infarct in the left basal ganglia. Mild to moderate periventricular chronic small vessel ischemia. No evidence of territorial infarct or acute ischemia. No extra-axial or intracranial fluid collection. Vascular: Atherosclerosis of skullbase vasculature without hyperdense vessel or abnormal calcification. Skull: No fracture or focal lesion. Sinuses/Orbits: Paranasal sinuses and mastoid air cells are clear. The visualized orbits are unremarkable. Bilateral cataract resection Other: None. IMPRESSION: 1. No acute intracranial abnormality. No skull fracture. 2. Age related atrophy and chronic small vessel ischemia. Remote lacunar infarct in the left basal ganglia. Electronically Signed   By: Keith Rake M.D.   On: 08/14/2021 18:55   DG Hand Complete Right  Result Date: 08/14/2021 CLINICAL DATA:  Post fall with right fifth digit pain.  EXAM: RIGHT HAND - COMPLETE 3+ VIEW COMPARISON:  None Available. FINDINGS: There is no evidence of fracture or dislocation. Particularly, no fracture of the fifth digit. Multifocal osteoarthritis most prominently involving the thumb carpal metacarpal metacarpal interphalangeal joints. Scattered degenerative carpal bone cysts. Degenerative change of the distal radioulnar joint. Soft tissues are unremarkable. IMPRESSION: 1. No acute fracture or subluxation of the right hand. 2. Multifocal osteoarthritis. Electronically Signed   By: Keith Rake M.D.   On: 08/14/2021 19:16    Procedures Procedures   Splint check: Splint positioning is good.  Fingers are warm and dry.  Patient has good movement. Medications Ordered in ED Medications  acetaminophen (TYLENOL) tablet 1,000 mg (1,000 mg Oral Given 08/14/21 1928)    ED Course/ Medical Decision Making/ A&P                           Medical Decision Making Amount and/or Complexity of Data Reviewed Radiology: ordered.  Risk OTC drugs.   Patient presents with a  mechanical fall.  Predominant area of discomfort is in the right shoulder and elbow.  Patient does however have intact range of motion does not appear dislocated.  We will proceed with x-rays to rule out any occult fracture.  Also it sounds as if she dislocated her fifth right digit although reduced it herself.  We will get x-rays.  Will splint the digit.  Patient does take Plavix.  She describes lightly hitting the back of her head.  There is no hematoma and no headache however with the patient's age and mild injury, will proceed with CT head.  Will give acetaminophen for help with shoulder and elbow pain.  Plan will be to redressed the skin tear with Xeroform and gauze.  Elbow x-ray interpreted by radiology and also visually reviewed by myself shows small radial head fracture without displacement.  CT head reviewed by radiology no acute findings.  Shoulder interpreted by radiology no acute findings  I have reassessed the patient.  She does have clear mental status.  No signs of confusion or distress.  At 89 patient is in excellent condition.  Sugar-tong splint applied by ED tech for nondisplaced radial head fracture.  Placement looks good.  Patient is in a sling.  We reviewed return precautions and home care.  We have also reviewed follow-up with orthopedics as soon as possible for recheck.  Recommendations have been made for extra strength Tylenol for pain control.        Final Clinical Impression(s) / ED Diagnoses Final diagnoses:  Closed fracture of proximal end of right radius, unspecified fracture morphology, initial encounter  Sprain of right shoulder, unspecified shoulder sprain type, initial encounter  Minor head injury, initial encounter  Fall, initial encounter  Skin tear of right elbow without complication, initial encounter    Rx / DC Orders ED Discharge Orders     None         Charlesetta Shanks, MD 08/14/21 2043

## 2021-08-14 NOTE — ED Notes (Signed)
Pt agreeable with plan for d/c home -- This nurse has verbally reinforced d/c instructions and provided pt with written copy- pt acknowledges verbal understanding and denies any additional questions, concerns, needs -- escorted to vehicle via w/c by this nurse- friend is transporting patient home

## 2021-08-14 NOTE — ED Triage Notes (Signed)
Pt presents from [facility] via P[facility] after running into a garage door and falling. Pt states that she fell on her bottom and caught herself with her R elbow. Pt hit head on garage door but not the ground. Pt complains of R shoulder and elbow pain.

## 2021-08-14 NOTE — ED Notes (Signed)
Patient transported to X-ray 

## 2021-08-14 NOTE — ED Notes (Signed)
ED tech now at bedside to place short arm splint RUE  - PMS intact at this time -- Xeroform placed to R elbow skin tear with stretch gauze bandage now showing fresh blood through bandage-- tech to change out bandage and reinforce with gauze prior to splint placement

## 2021-08-22 ENCOUNTER — Encounter: Payer: Self-pay | Admitting: Family Medicine

## 2021-08-22 ENCOUNTER — Ambulatory Visit (INDEPENDENT_AMBULATORY_CARE_PROVIDER_SITE_OTHER): Payer: Medicare Other | Admitting: Family Medicine

## 2021-08-22 ENCOUNTER — Telehealth: Payer: Self-pay | Admitting: Family Medicine

## 2021-08-22 VITALS — BP 138/70 | HR 82 | Temp 97.9°F | Ht 62.0 in | Wt 167.4 lb

## 2021-08-22 DIAGNOSIS — Z0000 Encounter for general adult medical examination without abnormal findings: Secondary | ICD-10-CM

## 2021-08-22 DIAGNOSIS — E1122 Type 2 diabetes mellitus with diabetic chronic kidney disease: Secondary | ICD-10-CM

## 2021-08-22 DIAGNOSIS — E1169 Type 2 diabetes mellitus with other specified complication: Secondary | ICD-10-CM

## 2021-08-22 DIAGNOSIS — H353210 Exudative age-related macular degeneration, right eye, stage unspecified: Secondary | ICD-10-CM

## 2021-08-22 DIAGNOSIS — E785 Hyperlipidemia, unspecified: Secondary | ICD-10-CM

## 2021-08-22 DIAGNOSIS — N1831 Chronic kidney disease, stage 3a: Secondary | ICD-10-CM

## 2021-08-22 LAB — LIPID PANEL
Cholesterol: 175 mg/dL (ref 0–200)
HDL: 86.2 mg/dL (ref >39.00)
LDL Cholesterol: 65 mg/dL (ref 0–99)
NonHDL: 88.31
Total CHOL/HDL Ratio: 2
Triglycerides: 118 mg/dL (ref 0.0–149.0)
VLDL: 23.6 mg/dL (ref 0.0–40.0)

## 2021-08-22 LAB — COMPREHENSIVE METABOLIC PANEL
ALT: 16 U/L (ref 0–35)
AST: 17 U/L (ref 0–37)
Albumin: 4.1 g/dL (ref 3.5–5.2)
Alkaline Phosphatase: 122 U/L — ABNORMAL HIGH (ref 39–117)
BUN: 18 mg/dL (ref 6–23)
CO2: 29 mEq/L (ref 19–32)
Calcium: 9.7 mg/dL (ref 8.4–10.5)
Chloride: 97 mEq/L (ref 96–112)
Creatinine, Ser: 0.92 mg/dL (ref 0.40–1.20)
GFR: 52.56 mL/min — ABNORMAL LOW (ref >60.00)
Glucose, Bld: 125 mg/dL — ABNORMAL HIGH (ref 70–99)
Potassium: 3.8 mEq/L (ref 3.5–5.1)
Sodium: 135 mEq/L (ref 135–145)
Total Bilirubin: 1 mg/dL (ref 0.2–1.2)
Total Protein: 7.5 g/dL (ref 6.0–8.3)

## 2021-08-22 LAB — HEMOGLOBIN A1C: Hgb A1c MFr Bld: 6.3 % (ref 4.6–6.5)

## 2021-08-22 LAB — CBC WITH DIFFERENTIAL/PLATELET
Basophils Absolute: 0 10*3/uL (ref 0.0–0.1)
Basophils Relative: 0.5 % (ref 0.0–3.0)
Eosinophils Absolute: 0.2 10*3/uL (ref 0.0–0.7)
Eosinophils Relative: 1.9 % (ref 0.0–5.0)
HCT: 35.4 % — ABNORMAL LOW (ref 36.0–46.0)
Hemoglobin: 12.2 g/dL (ref 12.0–15.0)
Lymphocytes Relative: 19.8 % (ref 12.0–46.0)
Lymphs Abs: 1.7 10*3/uL (ref 0.7–4.0)
MCHC: 34.5 g/dL (ref 30.0–36.0)
MCV: 89.6 fl (ref 78.0–100.0)
Monocytes Absolute: 0.8 10*3/uL (ref 0.1–1.0)
Monocytes Relative: 8.6 % (ref 3.0–12.0)
Neutro Abs: 6 10*3/uL (ref 1.4–7.7)
Neutrophils Relative %: 69.2 % (ref 43.0–77.0)
Platelets: 408 10*3/uL — ABNORMAL HIGH (ref 150.0–400.0)
RBC: 3.95 Mil/uL (ref 3.87–5.11)
RDW: 13.4 % (ref 11.5–15.5)
WBC: 8.7 10*3/uL (ref 4.0–10.5)

## 2021-08-22 NOTE — Addendum Note (Signed)
Addended by: Doran Clay A on: 08/22/2021 11:48 AM   Modules accepted: Orders

## 2021-08-22 NOTE — Telephone Encounter (Signed)
Called and spoke with pt, handicap placard filled out and mailed to pt per her request.

## 2021-08-22 NOTE — Progress Notes (Signed)
Phone [phone]   Subjective:  Patient presents today for their annual physical. Chief complaint-noted.   See problem oriented charting- ROS- full  review of systems was completed and negative except for: shortness of breath baseline, wheezing with walking, leg swelling, ongoing hip pain, shoulder pain after recent fall, trouble walking- using walker  The following were reviewed and entered/updated in epic: Past Medical History:  Diagnosis Date   Adenomatous polyps    Arthritis    Cerebrovascular accident ([place])    Cervical cancer ([place])    Cervical Ca-no radiation or chemo   Diverticulosis    Edema    Effusion of ankle and foot joint    GERD (gastroesophageal reflux disease)    History of UTI    Hypertension    Macular degeneration    Observation for suspected cardiovascular disease    Other abnormal blood chemistry    Peptic ulcer disease    Pyloric stenosis    S/P total abdominal hysterectomy    Special screening for malignant neoplasm of colon    Patient Active Problem List   Diagnosis Date Noted   Type 2 diabetes mellitus with diabetic chronic kidney disease ([place]) 06/28/2014    Priority: High   ACP (advance care planning) 12/17/2012    Priority: High   History of CVA in adulthood     Priority: High   Exudative age-related macular degeneration of right eye, unspecified stage ([place]) 08/10/2019    Priority: Medium    CKD (chronic kidney disease), stage III ([place]) 07/29/2017    Priority: Medium    Hyperlipidemia associated with type 2 diabetes mellitus ([place]) 06/28/2014    Priority: Medium    Essential hypertension 12/02/2006    Priority: Medium    Low back pain without sciatica 08/04/2018    Priority: Low   Routine health maintenance 08/25/2011    Priority: Low   GERD 12/02/2006    Priority: Low   Past Surgical History:  Procedure Laterality Date   ABDOMINAL HYSTERECTOMY     including cervix   CATARACT EXTRACTION  2008   Dr Kathrin Penner   cyst on right  hand removed     TEE WITHOUT CARDIOVERSION  11/20/2011   TEE with bubble study-PFO    Family History  Problem Relation Age of Onset   Heart failure Mother    Hypertension Mother    Coronary artery disease Mother    Hypertension Sister    Stroke Brother    Hypertension Maternal Grandmother    Stroke Maternal Grandmother    Hypertension Maternal Grandfather     Medications- reviewed and updated Current Outpatient Medications  Medication Sig Dispense Refill   atorvastatin (LIPITOR) 20 MG tablet TAKE 1 TABLET ONCE A WEEK. 13 tablet 3   Bisacodyl (DULCOLAX PO) Take 1 tablet by mouth as needed.     clopidogrel (PLAVIX) 75 MG tablet TAKE (1) TABLET DAILY. 90 tablet 3   diltiazem (CARDIZEM CD) 240 MG 24 hr capsule TAKE 1 CAPSULE DAILY. 90 capsule 3   hydrochlorothiazide (HYDRODIURIL) 25 MG tablet TAKE 1 TABLET ONCE DAILY. 90 tablet 3   Multiple Vitamins-Minerals (CENTRUM SILVER) tablet Take 1 tablet by mouth daily.     pantoprazole (PROTONIX) 20 MG tablet TAKE 1 TABLET EACH DAY. 90 tablet 3   No current facility-administered medications for this visit.    Allergies-reviewed and updated No Known Allergies  Social History   Social History Narrative   HSG, [employer]. Married '50- widowed after 70 years.   Lives in friends  home [place]- 2 rooms (living room and bedroom and 2 balconies)      On the list [facility]   ACP/End of life: No CPR, no heroic/futile treatment, i.e. prolonged ICU care. HCPA sister [name] tele [phone].      Patient is retired from [employer] after 35 years   Right handed.   Caffeine two cups of coffee daily. Very Rare coke.      Hobbies: play golf, play bridge   Objective  Objective:  BP 138/70   Pulse 82   Temp 97.9 F (36.6 C)   Ht '5\' 2"'$  (1.575 m)   Wt 167 lb 6.4 oz (75.9 kg)   SpO2 99%   BMI 30.62 kg/m  Gen: NAD, resting comfortably, appears younger than stated age HEENT: Mucous membranes are moist. Oropharynx normal Neck: no  thyromegaly CV: RRR no murmurs rubs or gallops Lungs: CTAB no crackles, wheeze, rhonchi Abdomen: soft/nontender/nondistended/normal bowel sounds. No rebound or guarding.  Ext: 1+ edema Skin: warm, dry Neuro: grossly normal, moves all extremities, PERRLA, uses walker Msk: some pain with lifting right shoulder- is going to return to orthopedics    Diabetic Foot Exam - Simple   Simple Foot Form Diabetic Foot exam was performed with the following findings: Yes 08/22/2021 11:33 AM  Visual Inspection See comments: Yes Sensation Testing See comments: Yes Pulse Check Posterior Tibialis and Dorsalis pulse intact bilaterally: Yes Comments Slight callous top of foot where rubs shoe. Does not feel monofilament on toes or ends of foot- good sensation on top of foot       Assessment and Plan   88 y.o. female presenting for annual physical.  Health Maintenance counseling: 1. Anticipatory guidance: Patient counseled regarding regular dental exams -q6 months, eye exams - Dr. Prudencio Burly as well as dr. Jule Ser (injections every 3 months in R eye) - Follows closely due to history of macular degeneration,  avoiding smoking and second hand smoke , limiting alcohol to 1 beverage per day- rare wine maybe once a week or month , no illicit drugs .   2. Risk factor reduction:  Advised patient of need for regular exercise and diet rich and fruits and vegetables to reduce risk of heart attack and stroke.  Exercise- was trying ot get restarted prior to fall.  Diet/weight management-has gained 8 pounds in the last year-discussed weight stability/moderation- plans to cut back on dessert.  Wt Readings from Last 3 Encounters:  08/22/21 167 lb 6.4 oz (75.9 kg)  08/14/21 163 lb (73.9 kg)  02/12/21 163 lb 8 oz (74.2 kg)  3. Immunizations/screenings/ancillary studies-declines Prevnar 20, declines Shingrix, wants to wait on COVID vaccination until the fall  Immunization History  Administered Date(s) Administered    Influenza-Unspecified 01/09/2018   Moderna Sars-Covid-2 Vaccination 04/12/2019, 05/10/2019, 01/18/2020, 08/08/2020   Pfizer Covid-19 Vaccine Bivalent Booster 47yr & up 11/28/2020  4. Cervical cancer screening- past age based screening recommendations 5. Breast cancer screening-  past age based screening recommendations 6. Colon cancer screening - past age based screening recommendations 7. Skin cancer screening- not seeing dermatology. advised regular sunscreen use. Denies worrisome, changing, or new skin lesions.  8. Birth control/STD check- not sexually active 9. Osteoporosis screening at 89 declines DEXA-states would not take medicine even if had osteoporosis  10. Smoking associated screening - former smoker-quit in 1950 and no further screenings  Status of chronic or acute concerns   #Recent wrist fracture-08/14/2021 seen in emergency department after mechanical fall-sounds like she relocated a dislocated fifth right digit.  She was seen in follow-up by Dr. Gladstone Lighter for nondisplaced radial head fracture - she reports Dr. Gladstone Lighter took her out of the splint with no brace needed- will follow up again in about a month. She states the cast/sling bothered her and she is glad she is out of them- still has some shoulder pain (she will mention at ortho follow up) -she states there was something hanging down that slightly hit head as she was walking into play bridge and it just set her off balance- -usually does well as long as using walker  #R Hip pain- saw Dr. Roda Shutters in january- had been given celebrex - rarely uses. She is tolerating better. Was told not surgical candidate it sounds like  #History of CVA/stroke-patient followed by Dr. Leonie Man in the past with last visit in 2018.  Patient remains on Plavix which we are prescribing  # Diabetes-typically well controlled with A1c under 7 S: Medication:None-diet controlled CBGs- does not have a meter Lab Results  Component Value Date   HGBA1C 6.5  02/12/2021  A/P: hopefully stable- update a1c today. Continue current meds for now  #hyperlipidemia-LDL goal under 70 with history of stroke  S: Medication: Atorvastatin '20mg'$  once a week Lab Results  Component Value Date   CHOL 173 08/14/2020   HDL 80.00 08/14/2020   LDLCALC 70 08/14/2020   LDLDIRECT 69.0 01/28/2018   TRIG 117.0 08/14/2020   CHOLHDL 2 08/14/2020   A/P: has been at goal- update lipids with labs today  #hypertension S: medication: Cardizem '240mg'$  extended release, HCTZ '25Mg'$  A/P: reasonable control especially since no meds yet  #Chronic kidney disease stage III S: GFR is typically in the 40s range  -Patient knows to avoid NSAIDs- does have sparing celebrex to take and discussed some risks but using sparingly  A/P: hopefully stable- update cmp today. Continue current meds for now   # GERD-history of peptic ulcer.  Failed H2 blocker trial S: Medication: Pantoprazole 20 mg  Lab Results  Component Value Date   VITAMINB12 622 02/12/2021  A/P: has done well- continue current meds   Recommended follow up: Return in about 6 months (around 02/21/2022) for followup or sooner if needed.Schedule b4 you leave.  Lab/Order associations: fasting   ICD-10-CM   1. Preventative health care  Z00.00     2. Type 2 diabetes mellitus with stage 3a chronic kidney disease, without long-term current use of insulin (HCC)  E11.22 Microalbumin / creatinine urine ratio   N18.31 CBC with Differential/Platelet    Comprehensive metabolic panel    Lipid panel    Hemoglobin A1c    3. Stage 3a chronic kidney disease (HCC)  N18.31     4. Hyperlipidemia associated with type 2 diabetes mellitus ([place])  E11.69    E78.5     5. Exudative age-related macular degeneration of right eye, unspecified stage ([place]) Chronic H35.3210       No orders of the defined types were placed in this encounter.   Return precautions advised.  Garret Reddish, MD

## 2021-08-22 NOTE — Patient Instructions (Addendum)
Have your eye doctor send [place] a copy of next diabetic eye exam  Please stop by lab before you go If you have mychart- we will send your results within 3 business days of [place] receiving them.  If you do not have mychart- we will call you about results within 5 business days of [place] receiving them.  *please also note that you will see labs on mychart as soon as they post. I will later go in and write notes on them- will say "notes from Dr. Yong Channel"   Recommended follow up: Return in about 6 months (around 02/21/2022) for followup or sooner if needed.Schedule b4 you leave.

## 2021-08-22 NOTE — Telephone Encounter (Signed)
Patient called stating she forgot to mention during her visit with Dr. Yong Channel on 08/22/21, she needs a re-verification of her handicap placard. Please Advise.

## 2021-11-04 ENCOUNTER — Other Ambulatory Visit: Payer: Self-pay | Admitting: Family Medicine

## 2022-02-20 ENCOUNTER — Encounter: Payer: Self-pay | Admitting: Family Medicine

## 2022-02-20 ENCOUNTER — Ambulatory Visit (INDEPENDENT_AMBULATORY_CARE_PROVIDER_SITE_OTHER): Payer: Medicare Other | Admitting: Family Medicine

## 2022-02-20 VITALS — BP 130/60 | HR 75 | Temp 97.3°F | Ht 62.0 in | Wt 166.8 lb

## 2022-02-20 DIAGNOSIS — E1122 Type 2 diabetes mellitus with diabetic chronic kidney disease: Secondary | ICD-10-CM

## 2022-02-20 DIAGNOSIS — E1169 Type 2 diabetes mellitus with other specified complication: Secondary | ICD-10-CM | POA: Diagnosis not present

## 2022-02-20 DIAGNOSIS — R748 Abnormal levels of other serum enzymes: Secondary | ICD-10-CM

## 2022-02-20 DIAGNOSIS — I10 Essential (primary) hypertension: Secondary | ICD-10-CM

## 2022-02-20 DIAGNOSIS — N1831 Chronic kidney disease, stage 3a: Secondary | ICD-10-CM

## 2022-02-20 DIAGNOSIS — E785 Hyperlipidemia, unspecified: Secondary | ICD-10-CM

## 2022-02-20 LAB — COMPREHENSIVE METABOLIC PANEL
ALT: 16 U/L (ref 0–35)
AST: 19 U/L (ref 0–37)
Albumin: 4.3 g/dL (ref 3.5–5.2)
Alkaline Phosphatase: 121 U/L — ABNORMAL HIGH (ref 39–117)
BUN: 28 mg/dL — ABNORMAL HIGH (ref 6–23)
CO2: 27 mEq/L (ref 19–32)
Calcium: 9.6 mg/dL (ref 8.4–10.5)
Chloride: 100 mEq/L (ref 96–112)
Creatinine, Ser: 1.13 mg/dL (ref 0.40–1.20)
GFR: 40.93 mL/min — ABNORMAL LOW (ref >60.00)
Glucose, Bld: 133 mg/dL — ABNORMAL HIGH (ref 70–99)
Potassium: 3.6 mEq/L (ref 3.5–5.1)
Sodium: 139 mEq/L (ref 135–145)
Total Bilirubin: 0.5 mg/dL (ref 0.2–1.2)
Total Protein: 7.3 g/dL (ref 6.0–8.3)

## 2022-02-20 LAB — CBC WITH DIFFERENTIAL/PLATELET
Basophils Absolute: 0 10*3/uL (ref 0.0–0.1)
Basophils Relative: 0.6 % (ref 0.0–3.0)
Eosinophils Absolute: 0.2 10*3/uL (ref 0.0–0.7)
Eosinophils Relative: 2.7 % (ref 0.0–5.0)
HCT: 41.1 % (ref 36.0–46.0)
Hemoglobin: 14.2 g/dL (ref 12.0–15.0)
Lymphocytes Relative: 28.3 % (ref 12.0–46.0)
Lymphs Abs: 2.1 10*3/uL (ref 0.7–4.0)
MCHC: 34.6 g/dL (ref 30.0–36.0)
MCV: 88.6 fl (ref 78.0–100.0)
Monocytes Absolute: 0.6 10*3/uL (ref 0.1–1.0)
Monocytes Relative: 8.4 % (ref 3.0–12.0)
Neutro Abs: 4.5 10*3/uL (ref 1.4–7.7)
Neutrophils Relative %: 60 % (ref 43.0–77.0)
Platelets: 337 10*3/uL (ref 150.0–400.0)
RBC: 4.63 Mil/uL (ref 3.87–5.11)
RDW: 13.6 % (ref 11.5–15.5)
WBC: 7.5 10*3/uL (ref 4.0–10.5)

## 2022-02-20 LAB — MICROALBUMIN / CREATININE URINE RATIO
Creatinine,U: 210.2 mg/dL
Microalb Creat Ratio: 2.1 mg/g (ref 0.0–30.0)
Microalb, Ur: 4.5 mg/dL — ABNORMAL HIGH (ref 0.0–1.9)

## 2022-02-20 LAB — HEMOGLOBIN A1C: Hgb A1c MFr Bld: 6.8 % — ABNORMAL HIGH (ref 4.6–6.5)

## 2022-02-20 LAB — VITAMIN D 25 HYDROXY (VIT D DEFICIENCY, FRACTURES): VITD: 35.38 ng/mL (ref 30.00–100.00)

## 2022-02-20 LAB — GAMMA GT: GGT: 24 U/L (ref 7–51)

## 2022-02-20 NOTE — Patient Instructions (Addendum)
Sign release of information at the check out desk for diabetic eye exam.  Consider shingrix at pharmacy  You are eligible to schedule your annual wellness visit with our nurse specialist Otila Kluver.  Please consider scheduling this before you leave today  Please stop by lab before you go If you have mychart- we will send your results within 3 business days of [place] receiving them.  If you do not have mychart- we will call you about results within 5 business days of [place] receiving them.  *please also note that you will see labs on mychart as soon as they post. I will later go in and write notes on them- will say "notes from Dr. Yong Channel"   Recommended follow up: Return in about 6 months (around 08/22/2022) for physical or sooner if needed.Schedule b4 you leave.

## 2022-02-20 NOTE — Progress Notes (Signed)
Phone [phone] In person visit   Subjective:   [name] is a 88 y.o. year old very pleasant female patient who presents for/with See problem oriented charting Chief Complaint  Patient presents with   Follow-up   Hypertension   Diabetes   Gastroesophageal Reflux   Back Pain    Pt c/o muscle spasm on the right side of her back that has been around for a few days    Past Medical History-  Patient Active Problem List   Diagnosis Date Noted   Type 2 diabetes mellitus with diabetic chronic kidney disease ([place]) 06/28/2014    Priority: High   ACP (advance care planning) 12/17/2012    Priority: High   History of CVA in adulthood     Priority: High   Exudative age-related macular degeneration of right eye, unspecified stage ([place]) 08/10/2019    Priority: Medium    CKD (chronic kidney disease), stage III ([place]) 07/29/2017    Priority: Medium    Hyperlipidemia associated with type 2 diabetes mellitus ([place]) 06/28/2014    Priority: Medium    Essential hypertension 12/02/2006    Priority: Medium    Low back pain without sciatica 08/04/2018    Priority: Low   Routine health maintenance 08/25/2011    Priority: Low   GERD 12/02/2006    Priority: Low    Medications- reviewed and updated Current Outpatient Medications  Medication Sig Dispense Refill   atorvastatin (LIPITOR) 20 MG tablet TAKE 1 TABLET ONCE A WEEK. 13 tablet 3   Bisacodyl (DULCOLAX PO) Take 1 tablet by mouth as needed.     clopidogrel (PLAVIX) 75 MG tablet TAKE ONE TABLET BY MOUTH DAILY 90 tablet 3   diltiazem (CARDIZEM CD) 240 MG 24 hr capsule TAKE ONE CAPSULE BY MOUTH DAILY 90 capsule 3   hydrochlorothiazide (HYDRODIURIL) 25 MG tablet TAKE ONE TABLET BY MOUTH ONCE DAILY 90 tablet 3   Multiple Vitamins-Minerals (CENTRUM SILVER) tablet Take 1 tablet by mouth daily.     pantoprazole (PROTONIX) 20 MG tablet TAKE ONE TABLET BY MOUTH EVERY DAY 90 tablet 3   No current facility-administered medications for this visit.      Objective:  BP 130/60   Pulse 75   Temp (!) 97.3 F (36.3 C)   Ht '5\' 2"'$  (1.575 m)   Wt 166 lb 12.8 oz (75.7 kg)   SpO2 98%   BMI 30.51 kg/m  Gen: NAD, resting comfortably CV: RRR no murmurs rubs or gallops Lungs: CTAB no crackles, wheeze, rhonchi Ext: 1+ pitting edema with venous stasis skin changes (not interested in compression stockings) Skin: warm, dry Neuro: walks with walker    Assessment and Plan   #social update- drivers license expired and plans to sell her car  # Right back pain S: Patient has noted some right thoracic back pain with associated muscle spasm for several days- comes on for a few seconds or minute then goes away. Comes on randomly  but often with motion - Denies fall or injury- using hre walker  -had been prescribed some celebrex from orthopedics for her hip in January- they will not do surgery with her age- also takes aleve- warned about renal risks and CVA risks  ROS-No saddle anesthesia, bladder incontinence, fecal incontinence, weakness in extremity, numbness or tingling in extremity.  A/P: back pain is intermittent at this point- tried PT before and not helpful in past- does not want further intervention at this time- plans on sparing celebrex even with renal risks.    #  History of CVA/stroke-patient followed by Dr. Leonie Man in the past with last visit in 2018.  Patient remains on Plavix which we are prescribing at 75 mg  # Diabetes-typically well controlled with A1c under 7 S: Medication:None-diet controlled CBGs- does not have a meter Exercise and diet- limited exercise and loves sugar but thankfully #s have bene ok on a1c Lab Results  Component Value Date   HGBA1C 6.3 08/22/2021   HGBA1C 6.5 02/12/2021   HGBA1C 6.4 08/14/2020  A/P: hopefully stable- update a1c today. Continue without meds for now   #hyperlipidemia-LDL goal under 70 with history of stroke  S: Medication: Atorvastatin '20mg'$  once a week Lab Results  Component Value Date    CHOL 175 08/22/2021   HDL 86.20 08/22/2021   LDLCALC 65 08/22/2021   LDLDIRECT 69.0 01/28/2018   TRIG 118.0 08/22/2021   CHOLHDL 2 08/22/2021   A/P: lipids looked great in June- continue current medications   #hypertension S: medication: Cardizem '240mg'$  extended release, HCTZ '25Mg'$  BP Readings from Last 3 Encounters:  02/20/22 130/60  08/22/21 138/70  08/14/21 128/67  A/P: reasonable control for age - continue current medications  #Chronic kidney disease stage III S: GFR is typically in the 40s range  -Patient knows to avoid NSAIDs  A/P: update with labs today   #macular degeneration- receives injections every 3 months    Recommended follow up: Return in about 6 months (around 08/22/2022) for physical or sooner if needed.Schedule b4 you leave.  Lab/Order associations:   ICD-10-CM   1. Essential hypertension  I10     2. Type 2 diabetes mellitus with stage 3a chronic kidney disease, without long-term current use of insulin (HCC)  E11.22 Hemoglobin A1c   N18.31 CBC with Differential/Platelet    Comprehensive metabolic panel    3. Hyperlipidemia associated with type 2 diabetes mellitus ([place])  E11.69    E78.5     4. Stage 3a chronic kidney disease (HCC)  N18.31     5. Elevated alkaline phosphatase level  R74.8 VITAMIN D 25 Hydroxy (Vit-D Deficiency, Fractures)    Gamma GT     No orders of the defined types were placed in this encounter.  Return precautions advised.  Garret Reddish, MD

## 2022-05-29 ENCOUNTER — Telehealth: Payer: Self-pay | Admitting: Family Medicine

## 2022-05-29 NOTE — Telephone Encounter (Signed)
Contacted [name] to schedule their annual wellness visit. Appointment made for 06/04/2022.  [place] Direct Dial [phone]

## 2022-06-04 ENCOUNTER — Ambulatory Visit (INDEPENDENT_AMBULATORY_CARE_PROVIDER_SITE_OTHER): Payer: Medicare Other

## 2022-06-04 VITALS — Wt 166.0 lb

## 2022-06-04 DIAGNOSIS — Z0000 Encounter for general adult medical examination without abnormal findings: Secondary | ICD-10-CM

## 2022-06-04 NOTE — Patient Instructions (Signed)
[name] , Thank you for taking time to come for your Medicare Wellness Visit. I appreciate your ongoing commitment to your health goals. Please review the following plan we discussed and let me know if I can assist you in the future.   These are the goals we discussed:  Goals      Increase physical activity     Patient is moving to [facility] and they have a gym there. She is thinking about starting to work out when she moves there     Patient Stated     Drink more water and maybe lessen sweets     Patient Stated        This is a list of the screening recommended for you and due dates:  Health Maintenance  Topic Date Due   Zoster (Shingles) Vaccine (1 of 2) Never done   Eye exam for diabetics  08/02/2021   COVID-19 Vaccine (6 - 2023-24 season) 11/09/2021   Flu Shot  06/09/2022*   Pneumonia Vaccine (1 of 1 - PCV) 08/23/2022*   DEXA scan (bone density measurement)  06/27/2024*   Hemoglobin A1C  08/22/2022   Complete foot exam   08/23/2022   Medicare Annual Wellness Visit  06/04/2023   HPV Vaccine  Aged Out   DTaP/Tdap/Td vaccine  Discontinued  *Topic was postponed. The date shown is not the original due date.    Advanced directives: Please bring a copy of your health care power of attorney and living will to the office at your convenience.  Conditions/risks identified: none at this time   Next appointment: Follow up in one year for your annual wellness visit    Preventive Care 65 Years and Older, Female Preventive care refers to lifestyle choices and visits with your health care provider that can promote health and wellness. What does preventive care include? A yearly physical exam. This is also called an annual well check. Dental exams once or twice a year. Routine eye exams. Ask your health care provider how often you should have your eyes checked. Personal lifestyle choices, including: Daily care of your teeth and gums. Regular physical activity. Eating a healthy  diet. Avoiding tobacco and drug use. Limiting alcohol use. Practicing safe sex. Taking low-dose aspirin every day. Taking vitamin and mineral supplements as recommended by your health care provider. What happens during an annual well check? The services and screenings done by your health care provider during your annual well check will depend on your age, overall health, lifestyle risk factors, and family history of disease. Counseling  Your health care provider may ask you questions about your: Alcohol use. Tobacco use. Drug use. Emotional well-being. Home and relationship well-being. Sexual activity. Eating habits. History of falls. Memory and ability to understand (cognition). Work and work Statistician. Reproductive health. Screening  You may have the following tests or measurements: Height, weight, and BMI. Blood pressure. Lipid and cholesterol levels. These may be checked every 5 years, or more frequently if you are over 12 years old. Skin check. Lung cancer screening. You may have this screening every year starting at age 97 if you have a 30-pack-year history of smoking and currently smoke or have quit within the past 15 years. Fecal occult blood test (FOBT) of the stool. You may have this test every year starting at age 61. Flexible sigmoidoscopy or colonoscopy. You may have a sigmoidoscopy every 5 years or a colonoscopy every 10 years starting at age 65. Hepatitis C blood test. Hepatitis B  blood test. Sexually transmitted disease (STD) testing. Diabetes screening. This is done by checking your blood sugar (glucose) after you have not eaten for a while (fasting). You may have this done every 1-3 years. Bone density scan. This is done to screen for osteoporosis. You may have this done starting at age 23. Mammogram. This may be done every 1-2 years. Talk to your health care provider about how often you should have regular mammograms. Talk with your health care provider about  your test results, treatment options, and if necessary, the need for more tests. Vaccines  Your health care provider may recommend certain vaccines, such as: Influenza vaccine. This is recommended every year. Tetanus, diphtheria, and acellular pertussis (Tdap, Td) vaccine. You may need a Td booster every 10 years. Zoster vaccine. You may need this after age 48. Pneumococcal 13-valent conjugate (PCV13) vaccine. One dose is recommended after age 66. Pneumococcal polysaccharide (PPSV23) vaccine. One dose is recommended after age 15. Talk to your health care provider about which screenings and vaccines you need and how often you need them. This information is not intended to replace advice given to you by your health care provider. Make sure you discuss any questions you have with your health care provider. Document Released: 03/24/2015 Document Revised: 11/15/2015 Document Reviewed: 12/27/2014 Elsevier Interactive Patient Education  2017 [place] Prevention in the Home Falls can cause injuries. They can happen to people of all ages. There are many things you can do to make your home safe and to help prevent falls. What can I do on the outside of my home? Regularly fix the edges of walkways and driveways and fix any cracks. Remove anything that might make you trip as you walk through a door, such as a raised step or threshold. Trim any bushes or trees on the path to your home. Use bright outdoor lighting. Clear any walking paths of anything that might make someone trip, such as rocks or tools. Regularly check to see if handrails are loose or broken. Make sure that both sides of any steps have handrails. Any raised decks and porches should have guardrails on the edges. Have any leaves, snow, or ice cleared regularly. Use sand or salt on walking paths during winter. Clean up any spills in your garage right away. This includes oil or grease spills. What can I do in the bathroom? Use  night lights. Install grab bars by the toilet and in the tub and shower. Do not use towel bars as grab bars. Use non-skid mats or decals in the tub or shower. If you need to sit down in the shower, use a plastic, non-slip stool. Keep the floor dry. Clean up any water that spills on the floor as soon as it happens. Remove soap buildup in the tub or shower regularly. Attach bath mats securely with double-sided non-slip rug tape. Do not have throw rugs and other things on the floor that can make you trip. What can I do in the bedroom? Use night lights. Make sure that you have a light by your bed that is easy to reach. Do not use any sheets or blankets that are too big for your bed. They should not hang down onto the floor. Have a firm chair that has side arms. You can use this for support while you get dressed. Do not have throw rugs and other things on the floor that can make you trip. What can I do in the kitchen? Clean up any spills  right away. Avoid walking on wet floors. Keep items that you use a lot in easy-to-reach places. If you need to reach something above you, use a strong step stool that has a grab bar. Keep electrical cords out of the way. Do not use floor polish or wax that makes floors slippery. If you must use wax, use non-skid floor wax. Do not have throw rugs and other things on the floor that can make you trip. What can I do with my stairs? Do not leave any items on the stairs. Make sure that there are handrails on both sides of the stairs and use them. Fix handrails that are broken or loose. Make sure that handrails are as long as the stairways. Check any carpeting to make sure that it is firmly attached to the stairs. Fix any carpet that is loose or worn. Avoid having throw rugs at the top or bottom of the stairs. If you do have throw rugs, attach them to the floor with carpet tape. Make sure that you have a light switch at the top of the stairs and the bottom of the  stairs. If you do not have them, ask someone to add them for you. What else can I do to help prevent falls? Wear shoes that: Do not have high heels. Have rubber bottoms. Are comfortable and fit you well. Are closed at the toe. Do not wear sandals. If you use a stepladder: Make sure that it is fully opened. Do not climb a closed stepladder. Make sure that both sides of the stepladder are locked into place. Ask someone to hold it for you, if possible. Clearly mark and make sure that you can see: Any grab bars or handrails. First and last steps. Where the edge of each step is. Use tools that help you move around (mobility aids) if they are needed. These include: Canes. Walkers. Scooters. Crutches. Turn on the lights when you go into a dark area. Replace any light bulbs as soon as they burn out. Set up your furniture so you have a clear path. Avoid moving your furniture around. If any of your floors are uneven, fix them. If there are any pets around you, be aware of where they are. Review your medicines with your doctor. Some medicines can make you feel dizzy. This can increase your chance of falling. Ask your doctor what other things that you can do to help prevent falls. This information is not intended to replace advice given to you by your health care provider. Make sure you discuss any questions you have with your health care provider. Document Released: 12/22/2008 Document Revised: 08/03/2015 Document Reviewed: 04/01/2014 Elsevier Interactive Patient Education  2017 [employer].

## 2022-06-04 NOTE — Progress Notes (Signed)
I connected with  [name] on 06/04/22 by a audio enabled telemedicine application and verified that I am speaking with the correct person using two identifiers.  Patient Location: Home  Provider Location: Office/Clinic  I discussed the limitations of evaluation and management by telemedicine. The patient expressed understanding and agreed to proceed.   Subjective:   [name] is a 89 y.o. female who presents for Medicare Annual (Subsequent) preventive examination.  Review of Systems     Cardiac Risk Factors include: advanced age (>81men, >30 women);diabetes mellitus;dyslipidemia;hypertension     Objective:    Today's Vitals   06/04/22 1157  Weight: 166 lb (75.3 kg)   Body mass index is 30.36 kg/m.     06/04/2022   12:04 PM 08/14/2021    5:16 PM 02/11/2020   10:16 AM 12/29/2018    8:38 AM 10/22/2017    4:16 PM 05/11/2014    9:20 AM 11/18/2011    8:37 PM  Advanced Directives  Does Patient Have a Medical Advance Directive? Yes No Yes Yes Yes  Patient would like information;Patient has advance directive, copy not in chart  Type of Advance Directive [place];Living will  [place];Living will Living will Living will [place];Living will Living will  Does patient want to make changes to medical advance directive?    No - Patient declined No - Patient declined    Copy of [place] in Chart? No - copy requested  No - copy requested   No - copy requested Copy requested from family  Would patient like information on creating a medical advance directive?  No - Patient declined     Advance directive packet given  Pre-existing out of facility DNR order (yellow form or pink MOST form)       No    Current Medications (verified) Outpatient Encounter Medications as of 06/04/2022  Medication Sig   atorvastatin (LIPITOR) 20 MG tablet TAKE 1 TABLET ONCE A WEEK.   Bisacodyl (DULCOLAX PO) Take 1 tablet by mouth as  needed.   clopidogrel (PLAVIX) 75 MG tablet TAKE ONE TABLET BY MOUTH DAILY   diltiazem (CARDIZEM CD) 240 MG 24 hr capsule TAKE ONE CAPSULE BY MOUTH DAILY   hydrochlorothiazide (HYDRODIURIL) 25 MG tablet TAKE ONE TABLET BY MOUTH ONCE DAILY   Multiple Vitamins-Minerals (CENTRUM SILVER) tablet Take 1 tablet by mouth daily.   pantoprazole (PROTONIX) 20 MG tablet TAKE ONE TABLET BY MOUTH EVERY DAY   No facility-administered encounter medications on file as of 06/04/2022.    Allergies (verified) Patient has no known allergies.   History: Past Medical History:  Diagnosis Date   Adenomatous polyps    Arthritis    Cerebrovascular accident ([place])    Cervical cancer ([place])    Cervical Ca-no radiation or chemo   Diverticulosis    Edema    Effusion of ankle and foot joint    GERD (gastroesophageal reflux disease)    History of UTI    Hypertension    Macular degeneration    Observation for suspected cardiovascular disease    Other abnormal blood chemistry    Peptic ulcer disease    Pyloric stenosis    S/P total abdominal hysterectomy    Special screening for malignant neoplasm of colon    Past Surgical History:  Procedure Laterality Date   ABDOMINAL HYSTERECTOMY     including cervix   CATARACT EXTRACTION  2008   Dr Kathrin Penner   cyst  on right hand removed     TEE WITHOUT CARDIOVERSION  11/20/2011   TEE with bubble study-PFO   Family History  Problem Relation Age of Onset   Heart failure Mother    Hypertension Mother    Coronary artery disease Mother    Hypertension Sister    Stroke Brother    Hypertension Maternal Grandmother    Stroke Maternal Grandmother    Hypertension Maternal Grandfather    Social History   Socioeconomic History   Marital status: Widowed    Spouse name: Not on file   Number of children: 0   Years of education: College   Highest education level: Not on file  Occupational History   Occupation: Retired    Fish farm manager: RETIRED  Tobacco Use   Smoking  status: Former    Types: Cigarettes    Quit date: 03/11/1948    Years since quitting: 74.2   Smokeless tobacco: Never  Vaping Use   Vaping Use: Never used  Substance and Sexual Activity   Alcohol use: Yes    Alcohol/week: 0.0 standard drinks of alcohol    Comment: Occasional wine, not very often   Drug use: No   Sexual activity: Not Currently  Other Topics Concern   Not on file  Social History Narrative   HSG, [employer]. Married '50- widowed after 71 years.   Lives in [facility] [place]- 2 rooms (living room and bedroom and 2 balconies)      On the list [facility]   ACP/End of life: No CPR, no heroic/futile treatment, i.e. prolonged ICU care. HCPA sister [name] tele [phone].      Patient is retired from [employer] after 35 years   Right handed.   Caffeine two cups of coffee daily. Very Rare coke.      Hobbies: play golf, play bridge   Social Determinants of Health   Financial Resource Strain: Low Risk  (06/04/2022)   Overall Financial Resource Strain (CARDIA)    Difficulty of Paying Living Expenses: Not hard at all  Food Insecurity: No Food Insecurity (06/04/2022)   Hunger Vital Sign    Worried About Running Out of Food in the Last Year: Never true    Ran Out of Food in the Last Year: Never true  Transportation Needs: No Transportation Needs (06/04/2022)   PRAPARE - Hydrologist (Medical): No    Lack of Transportation (Non-Medical): No  Physical Activity: Inactive (06/04/2022)   Exercise Vital Sign    Days of Exercise per Week: 0 days    Minutes of Exercise per Session: 0 min  Stress: No Stress Concern Present (06/04/2022)   [place]    Feeling of Stress : Not at all  Social Connections: Socially Isolated (06/04/2022)   Social Connection and Isolation Panel [NHANES]    Frequency of Communication with Friends and Family: Three times a week    Frequency of Social  Gatherings with Friends and Family: More than three times a week    Attends Religious Services: Never    Marine scientist or Organizations: No    Attends Archivist Meetings: Never    Marital Status: Widowed    Tobacco Counseling Counseling given: Not Answered   Clinical Intake:  Pre-visit preparation completed: Yes  Pain : No/denies pain     BMI - recorded: 30.36 Nutritional Status: BMI > 30  Obese Nutritional Risks: None Diabetes: Yes CBG done?: No Did  pt. bring in CBG monitor from home?: No  How often do you need to have someone help you when you read instructions, pamphlets, or other written materials from your doctor or pharmacy?: 1 - Never  Diabetic?Nutrition Risk Assessment:  Has the patient had any N/V/D within the last 2 months?  No  Does the patient have any non-healing wounds?  No  Has the patient had any unintentional weight loss or weight gain?  No   Diabetes:  Is the patient diabetic?  Yes  If diabetic, was a CBG obtained today?  No  Did the patient bring in their glucometer from home?  No  How often do you monitor your CBG's? N/a.   Financial Strains and Diabetes Management:  Are you having any financial strains with the device, your supplies or your medication? No .  Does the patient want to be seen by Chronic Care Management for management of their diabetes?  No  Would the patient like to be referred to a Nutritionist or for Diabetic Management?  No   Diabetic Exams:  Diabetic Eye Exam: Overdue for diabetic eye exam. Pt has been advised about the importance in completing this exam. Patient advised to call and schedule an eye exam. Diabetic Foot Exam: Completed 08/22/21   Interpreter Needed?: No  Information entered by :: Charlott Rakes, LPN   Activities of Daily Living    06/04/2022   12:05 PM  In your present state of health, do you have any difficulty performing the following activities:  Hearing? 1  Comment HOH   Vision? 0  Difficulty concentrating or making decisions? 0  Walking or climbing stairs? 0  Dressing or bathing? 0  Doing errands, shopping? 0  Preparing Food and eating ? N  Using the Toilet? N  In the past six months, have you accidently leaked urine? N  Do you have problems with loss of bowel control? N  Managing your Medications? N  Managing your Finances? N  Housekeeping or managing your Housekeeping? N    Patient Care Team: Marin Olp, MD as PCP - General (Family Medicine) Shon Hough, MD as Consulting Physician (Ophthalmology)  Indicate any recent Medical Services you may have received from other than [facility] providers in the past year (date may be approximate).     Assessment:   This is a routine wellness examination for [employer].  Hearing/Vision screen Hearing Screening - Comments:: Pt is HOH  Vision Screening - Comments:: Pt follows up with Dr Katy Apo  for annual eye exams   Dietary issues and exercise activities discussed: Current Exercise Habits: The patient does not participate in regular exercise at present   Goals Addressed             This Visit's Progress    Patient Stated       Patient Stated       None at this time        Depression Screen    06/04/2022   12:02 PM 08/22/2021   10:59 AM 08/14/2020   10:34 AM 02/11/2020   10:13 AM 08/10/2019    9:26 AM 12/29/2018    8:38 AM 12/29/2018    8:07 AM  PHQ 2/9 Scores  PHQ - 2 Score 0 0 0 0 0 0 0  PHQ- 9 Score  0 0  0      Fall Risk    06/04/2022   12:04 PM 08/22/2021   10:58 AM 08/14/2020   10:34 AM 02/11/2020   10:18  AM 08/10/2019    9:27 AM  Fall Risk   Falls in the past year? 0 1 0 0 0  Number falls in past yr: 0 0 0 0 0  Injury with Fall? 0 1 0 0 0  Risk for fall due to : Impaired vision;Impaired balance/gait History of fall(s)  Impaired balance/gait;Impaired vision   Follow up Falls prevention discussed Falls evaluation completed;Falls prevention discussed  Falls prevention  discussed     FALL RISK PREVENTION PERTAINING TO THE HOME:  Any stairs in or around the home? No  If so, are there any without handrails? No  Home free of loose throw rugs in walkways, pet beds, electrical cords, etc? Yes  Adequate lighting in your home to reduce risk of falls? Yes   ASSISTIVE DEVICES UTILIZED TO PREVENT FALLS:  Life alert? Yes  Use of a cane, walker or w/c? Yes  Grab bars in the bathroom? Yes  Shower chair or bench in shower? Yes  Elevated toilet seat or a handicapped toilet? Yes   TIMED UP AND GO:  Was the test performed? No .  Cognitive Function:        06/04/2022   12:05 PM 02/11/2020   10:21 AM 12/29/2018    8:39 AM 10/22/2017    4:22 PM  6CIT Screen  What Year? 0 points 0 points 0 points 0 points  What month? 0 points 0 points 0 points 0 points  What time? 0 points  0 points 0 points  Count back from 20 0 points 0 points 0 points 0 points  Months in reverse 0 points 0 points 0 points 0 points  Repeat phrase 0 points 0 points 0 points   Total Score 0 points  0 points     Immunizations Immunization History  Administered Date(s) Administered   Influenza-Unspecified 01/09/2018   Moderna Sars-Covid-2 Vaccination 04/12/2019, 05/10/2019, 01/18/2020, 08/08/2020   Pfizer Covid-19 Vaccine Bivalent Booster 37yrs & up 11/28/2020      Flu Vaccine status: Declined, Education has been provided regarding the importance of this vaccine but patient still declined. Advised may receive this vaccine at local pharmacy or Health Dept. Aware to provide a copy of the vaccination record if obtained from local pharmacy or Health Dept. Verbalized acceptance and understanding.  Pneumococcal vaccine status: Due, Education has been provided regarding the importance of this vaccine. Advised may receive this vaccine at local pharmacy or Health Dept. Aware to provide a copy of the vaccination record if obtained from local pharmacy or Health Dept. Verbalized acceptance and  understanding.  Covid-19 vaccine status: Completed vaccines  Qualifies for Shingles Vaccine? Yes   Zostavax completed No   Shingrix Completed?: No.    Education has been provided regarding the importance of this vaccine. Patient has been advised to call insurance company to determine out of pocket expense if they have not yet received this vaccine. Advised may also receive vaccine at local pharmacy or Health Dept. Verbalized acceptance and understanding.  Screening Tests Health Maintenance  Topic Date Due   Zoster Vaccines- Shingrix (1 of 2) Never done   OPHTHALMOLOGY EXAM  08/02/2021   COVID-19 Vaccine (6 - 2023-24 season) 11/09/2021   INFLUENZA VACCINE  06/09/2022 (Originally 10/09/2021)   Pneumonia Vaccine 44+ Years old (1 of 1 - PCV) 08/23/2022 (Originally 02/25/1990)   DEXA SCAN  06/27/2024 (Originally 02/25/1990)   HEMOGLOBIN A1C  08/22/2022   FOOT EXAM  08/23/2022   Medicare Annual Wellness (AWV)  06/04/2023   HPV VACCINES  Aged Out   DTaP/Tdap/Td  Discontinued    Health Maintenance  Health Maintenance Due  Topic Date Due   Zoster Vaccines- Shingrix (1 of 2) Never done   OPHTHALMOLOGY EXAM  08/02/2021   COVID-19 Vaccine (6 - 2023-24 season) 11/09/2021    Colorectal cancer screening: No longer required.   Mammogram status: No longer required due to age.    Additional Screening:   Vision Screening: Recommended annual ophthalmology exams for early detection of glaucoma and other disorders of the eye. Is the patient up to date with their annual eye exam?  Yes appt in June  Who is the provider or what is the name of the office in which the patient attends annual eye exams? Dr Katy Apo  If pt is not established with a provider, would they like to be referred to a provider to establish care? No .   Dental Screening: Recommended annual dental exams for proper oral hygiene  Community Resource Referral / Chronic Care Management: CRR required this visit?  No   CCM  required this visit?  No      Plan:     I have personally reviewed and noted the following in the patient's chart:   Medical and social history Use of alcohol, tobacco or illicit drugs  Current medications and supplements including opioid prescriptions. Patient is not currently taking opioid prescriptions. Functional ability and status Nutritional status Physical activity Advanced directives List of other physicians Hospitalizations, surgeries, and ER visits in previous 12 months Vitals Screenings to include cognitive, depression, and falls Referrals and appointments  In addition, I have reviewed and discussed with patient certain preventive protocols, quality metrics, and best practice recommendations. A written personalized care plan for preventive services as well as general preventive health recommendations were provided to patient.     Willette Brace, LPN   [phone]   Nurse Notes: none

## 2022-08-30 ENCOUNTER — Ambulatory Visit (INDEPENDENT_AMBULATORY_CARE_PROVIDER_SITE_OTHER): Payer: Medicare Other | Admitting: Family Medicine

## 2022-08-30 ENCOUNTER — Encounter: Payer: Self-pay | Admitting: Family Medicine

## 2022-08-30 ENCOUNTER — Other Ambulatory Visit: Payer: Self-pay | Admitting: Family Medicine

## 2022-08-30 VITALS — BP 138/72 | HR 72 | Ht 62.0 in | Wt 170.0 lb

## 2022-08-30 DIAGNOSIS — E785 Hyperlipidemia, unspecified: Secondary | ICD-10-CM

## 2022-08-30 DIAGNOSIS — R609 Edema, unspecified: Secondary | ICD-10-CM | POA: Diagnosis not present

## 2022-08-30 DIAGNOSIS — I10 Essential (primary) hypertension: Secondary | ICD-10-CM

## 2022-08-30 DIAGNOSIS — R0600 Dyspnea, unspecified: Secondary | ICD-10-CM

## 2022-08-30 DIAGNOSIS — E1122 Type 2 diabetes mellitus with diabetic chronic kidney disease: Secondary | ICD-10-CM | POA: Diagnosis not present

## 2022-08-30 DIAGNOSIS — N1831 Chronic kidney disease, stage 3a: Secondary | ICD-10-CM | POA: Diagnosis not present

## 2022-08-30 DIAGNOSIS — Z0000 Encounter for general adult medical examination without abnormal findings: Secondary | ICD-10-CM | POA: Diagnosis not present

## 2022-08-30 DIAGNOSIS — H353210 Exudative age-related macular degeneration, right eye, stage unspecified: Secondary | ICD-10-CM

## 2022-08-30 DIAGNOSIS — E1169 Type 2 diabetes mellitus with other specified complication: Secondary | ICD-10-CM

## 2022-08-30 LAB — URINALYSIS, ROUTINE W REFLEX MICROSCOPIC
Bilirubin Urine: NEGATIVE
Hgb urine dipstick: NEGATIVE
Ketones, ur: NEGATIVE
Nitrite: POSITIVE — AB
Specific Gravity, Urine: 1.015 (ref 1.000–1.030)
Total Protein, Urine: NEGATIVE
Urine Glucose: NEGATIVE
Urobilinogen, UA: 0.2 (ref 0.0–1.0)
pH: 7.5 (ref 5.0–8.0)

## 2022-08-30 LAB — CBC WITH DIFFERENTIAL/PLATELET
Basophils Absolute: 0.1 10*3/uL (ref 0.0–0.1)
Basophils Relative: 1.3 % (ref 0.0–3.0)
Eosinophils Absolute: 0.2 10*3/uL (ref 0.0–0.7)
Eosinophils Relative: 2.4 % (ref 0.0–5.0)
HCT: 42 % (ref 36.0–46.0)
Hemoglobin: 14 g/dL (ref 12.0–15.0)
Lymphocytes Relative: 27.9 % (ref 12.0–46.0)
Lymphs Abs: 2.2 10*3/uL (ref 0.7–4.0)
MCHC: 33.2 g/dL (ref 30.0–36.0)
MCV: 88.9 fl (ref 78.0–100.0)
Monocytes Absolute: 0.7 10*3/uL (ref 0.1–1.0)
Monocytes Relative: 8.8 % (ref 3.0–12.0)
Neutro Abs: 4.6 10*3/uL (ref 1.4–7.7)
Neutrophils Relative %: 59.6 % (ref 43.0–77.0)
Platelets: 324 10*3/uL (ref 150.0–400.0)
RBC: 4.73 Mil/uL (ref 3.87–5.11)
RDW: 13.4 % (ref 11.5–15.5)
WBC: 7.8 10*3/uL (ref 4.0–10.5)

## 2022-08-30 LAB — COMPREHENSIVE METABOLIC PANEL
ALT: 14 U/L (ref 0–35)
AST: 17 U/L (ref 0–37)
Albumin: 3.9 g/dL (ref 3.5–5.2)
Alkaline Phosphatase: 122 U/L — ABNORMAL HIGH (ref 39–117)
BUN: 19 mg/dL (ref 6–23)
CO2: 29 mEq/L (ref 19–32)
Calcium: 9.3 mg/dL (ref 8.4–10.5)
Chloride: 100 mEq/L (ref 96–112)
Creatinine, Ser: 0.98 mg/dL (ref 0.40–1.20)
GFR: 48.38 mL/min — ABNORMAL LOW (ref >60.00)
Glucose, Bld: 125 mg/dL — ABNORMAL HIGH (ref 70–99)
Potassium: 4.1 mEq/L (ref 3.5–5.1)
Sodium: 138 mEq/L (ref 135–145)
Total Bilirubin: 0.6 mg/dL (ref 0.2–1.2)
Total Protein: 7.1 g/dL (ref 6.0–8.3)

## 2022-08-30 LAB — LIPID PANEL
Cholesterol: 158 mg/dL (ref 0–200)
HDL: 75.7 mg/dL (ref >39.00)
LDL Cholesterol: 61 mg/dL (ref 0–99)
NonHDL: 81.99
Total CHOL/HDL Ratio: 2
Triglycerides: 106 mg/dL (ref 0.0–149.0)
VLDL: 21.2 mg/dL (ref 0.0–40.0)

## 2022-08-30 LAB — HEMOGLOBIN A1C: Hgb A1c MFr Bld: 6.6 % — ABNORMAL HIGH (ref 4.6–6.5)

## 2022-08-30 LAB — TSH: TSH: 0.61 u[IU]/mL (ref 0.35–5.50)

## 2022-08-30 LAB — BRAIN NATRIURETIC PEPTIDE: Pro B Natriuretic peptide (BNP): 200 pg/mL — ABNORMAL HIGH (ref 0.0–100.0)

## 2022-08-30 NOTE — Patient Instructions (Addendum)
Look for compression stocking caddy on amaz[employer] to help you put on compression stockings- if you get pain in your legs with these on stop and let me know  Please stop by lab before you go If you have mychart- we will send your results within 3 business days of [place] receiving them.  If you do not have mychart- we will call you about results within 5 business days of [place] receiving them.  *please also note that you will see labs on mychart as soon as they post. I will later go in and write notes on them- will say "notes from Dr. Durene Cal"   Recommended follow up: Return in about 6 months (around 03/01/2023) for followup or sooner if needed.Schedule b4 you leave.

## 2022-08-30 NOTE — Progress Notes (Signed)
Phone [phone]   Subjective:  Patient presents today for their annual physical. Chief complaint-noted.   See problem oriented charting- ROS- full  review of systems was completed and negative except for: hip pain, leg/feet swelling, weakness- gradually worsening mobility  The following were reviewed and entered/updated in epic: Past Medical History:  Diagnosis Date   Adenomatous polyps    Arthritis    Cerebrovascular accident (HCC)    Cervical cancer (HCC)    Cervical Ca-no radiation or chemo   Diverticulosis    Edema    Effusion of ankle and foot joint    GERD (gastroesophageal reflux disease)    History of UTI    Hypertension    Macular degeneration    Observation for suspected cardiovascular disease    Other abnormal blood chemistry    Peptic ulcer disease    Pyloric stenosis    S/P total abdominal hysterectomy    Special screening for malignant neoplasm of colon    Patient Active Problem List   Diagnosis Date Noted   Type 2 diabetes mellitus with diabetic chronic kidney disease (HCC) 06/28/2014    Priority: High   ACP (advance care planning) 12/17/2012    Priority: High   History of CVA in adulthood     Priority: High   Exudative age-related macular degeneration of right eye, unspecified stage (HCC) 08/10/2019    Priority: Medium    CKD (chronic kidney disease), stage III (HCC) 07/29/2017    Priority: Medium    Hyperlipidemia associated with type 2 diabetes mellitus (HCC) 06/28/2014    Priority: Medium    Essential hypertension 12/02/2006    Priority: Medium    Low back pain without sciatica 08/04/2018    Priority: Low   Routine health maintenance 08/25/2011    Priority: Low   GERD 12/02/2006    Priority: Low   Past Surgical History:  Procedure Laterality Date   ABDOMINAL HYSTERECTOMY     including cervix   CATARACT EXTRACTION  2008   Dr Dagoberto Ligas   cyst on right hand removed     TEE WITHOUT CARDIOVERSION  11/20/2011   TEE with bubble study-PFO     Family History  Problem Relation Age of Onset   Heart failure Mother    Hypertension Mother    Coronary artery disease Mother    Hypertension Sister    Stroke Brother    Hypertension Maternal Grandmother    Stroke Maternal Grandmother    Hypertension Maternal Grandfather     Medications- reviewed and updated Current Outpatient Medications  Medication Sig Dispense Refill   atorvastatin (LIPITOR) 20 MG tablet TAKE 1 TABLET ONCE A WEEK. 13 tablet 3   Bisacodyl (DULCOLAX PO) Take 1 tablet by mouth as needed.     clopidogrel (PLAVIX) 75 MG tablet TAKE ONE TABLET BY MOUTH DAILY 90 tablet 3   diltiazem (CARDIZEM CD) 240 MG 24 hr capsule TAKE ONE CAPSULE BY MOUTH DAILY 90 capsule 3   hydrochlorothiazide (HYDRODIURIL) 25 MG tablet TAKE ONE TABLET BY MOUTH ONCE DAILY 90 tablet 3   Multiple Vitamins-Minerals (CENTRUM SILVER) tablet Take 1 tablet by mouth daily.     naproxen sodium (ALEVE) 220 MG tablet Take 220 mg by mouth as needed.     pantoprazole (PROTONIX) 20 MG tablet TAKE ONE TABLET BY MOUTH EVERY DAY 90 tablet 3   No current facility-administered medications for this visit.    Allergies-reviewed and updated No Known Allergies  Social History   Social History Narrative   HSG,  [employer]. Married '50- widowed after 57 years.   [facility] [place]- 2 rooms (living room and bedroom and 2 balconies)      On the list [facility]   ACP/End of life: No CPR, no heroic/futile treatment, i.e. prolonged ICU care. HCPA sister Lysbeth Galas tele [phone].      Patient is retired from [employer] after 35 years   Right handed.   Caffeine two cups of coffee daily. Very Rare coke.      Hobbies: play golf, play bridge   Objective  Objective:  BP 138/72   Pulse 72   Ht 5\' 2"  (1.575 m)   Wt 170 lb (77.1 kg)   SpO2 98%   BMI 31.09 kg/m  Gen: NAD, resting comfortably HEENT: Mucous membranes are moist. Oropharynx normal Neck: no thyromegaly CV: RRR no murmurs rubs or  gallops Lungs: CTAB no crackles, wheeze, rhonchi Abdomen: soft/nontender/nondistended/normal bowel sounds. No rebound or guarding.  Ext: 1+ edema with chronic venous stasis skin changes- mild erythema and scaling Skin: warm, dry Neuro: grossly normal, moves all extremities, PERRLA  Diabetic Foot Exam - Simple   Simple Foot Form Diabetic Foot exam was performed with the following findings: Yes 08/30/2022 10:27 AM  Visual Inspection See comments: Yes Sensation Testing See comments: Yes Pulse Check Posterior Tibialis and Dorsalis pulse intact bilaterally: Yes Comments Callous on bilaterally great toe at MTP joint. No sensation on toes monofilament -podiatry sees her monthly and trims toenails       Assessment and Plan   89 y.o. female presenting for annual physical.  Health Maintenance counseling: 1. Anticipatory guidance: Patient counseled regarding regular dental exams -q6 months, eye exams -Dr. Randon Goldsmith and Dr. Lelan Pons (injections in right eye with macular degeneration),  avoiding smoking and second hand smoke , limiting alcohol to 1 beverage per day- very sparing wine , no illicit drugs.   2. Risk factor reduction:  Advised patient of need for regular exercise and diet rich and fruits and vegetables to reduce risk of heart attack and stroke.  Exercise- encouraged exercise- especially ones physical therapy gave her.  Diet/weight management-weight up 3 lbs from last year- she prefer to eat what she wants to eat unless diabetes worsens .  Wt Readings from Last 3 Encounters:  08/30/22 170 lb (77.1 kg)  06/04/22 166 lb (75.3 kg)  02/20/22 166 lb 12.8 oz (75.7 kg)  3. Immunizations/screenings/ancillary studies- reports scheduled for diabetes eye exam in July . Declines prevnar, COVID, shingrix  Immunization History  Administered Date(s) Administered   Influenza-Unspecified 01/09/2018   Moderna Sars-Covid-2 Vaccination 04/12/2019, 05/10/2019, 01/18/2020, 08/08/2020   Pfizer Covid-19  Vaccine Bivalent Booster 46yrs & up 11/28/2020  4. Cervical cancer screening- past age based screening recommendations  5. Breast cancer screening-  past age based screening recommendations  6. Colon cancer screening -  past age based screening recommendations  7. Skin cancer screening- not seeing dermatology. advised regular sunscreen use. Denies worrisome, changing, or new skin lesions.  8. Birth control/STD check- not dating  82. Osteoporosis screening at 64- declines DEXA- would not be interested in medications even if qualified 10. Smoking associated screening - former smoker- quit 1950 and no regular screening  Status of chronic or acute concerns   #Edema- has comrpession stockings but hard to get them on- ongoing edema issues at 1+. No pain.  -discussed compression stocking caddy  -does feel winded walking do dining room which is under 50 yards -check TSH and BNP as well today along  with other labs- include UA -has seen therapy and given exercises but she states "too lazy" to do that now- prefers to rest  #History of CVA/stroke-patient followed by Dr. Pearlean Brownie in the past with last visit in 2018.  Patient remains on Plavix which we are prescribing  # Diabetes-typically well controlled with A1c under 7 S: Medication:None-diet controlled CBGs- does not have a meter Lab Results  Component Value Date   HGBA1C 6.8 (H) 02/20/2022   HGBA1C 6.3 08/22/2021   HGBA1C 6.5 02/12/2021  A/P: hopefully stable- update a1c today. Continue without meds for now   #hyperlipidemia-LDL goal under 70 with history of stroke  S: Medication: Atorvastatin 20mg  once a week Lab Results  Component Value Date   CHOL 175 08/22/2021   HDL 86.20 08/22/2021   LDLCALC 65 08/22/2021   LDLDIRECT 69.0 01/28/2018   TRIG 118.0 08/22/2021   CHOLHDL 2 08/22/2021   A/P: #s looked good last year- update today- continue current medications for now   #hypertension S: medication: Cardizem 240mg  extended release, HCTZ  25Mg  BP Readings from Last 3 Encounters:  08/30/22 138/72  02/20/22 130/60  08/22/21 138/70  A/P: high acceptable range- continue current medications- hesitant to increase dose at her age - diltiazem may contribute to edema but we did not opt to change that today  #Chronic kidney disease stage III S: GFR is typically in the 40s range  -Patient knows to avoid NSAIDs  A/P: hopefully stable- update cmp today. Continue without meds for now     # GERD-history of peptic ulcer.  Failed H2 blocker trial S: Medication: Pantoprazole 20 mg A/P: well controlled continue current medications    #alk phos slightly high- vitamin D and ggt ok- monitor unless worens  Recommended follow up: Return in about 6 months (around 03/01/2023) for followup or sooner if needed.Schedule b4 you leave. Future Appointments  Date Time Provider Department Center  06/10/2023 11:15 AM [facility] ANNUAL WELLNESS VISIT 1 [facility]   Lab/Order associations: fasting   ICD-10-CM   1. Routine general medical examination at a health care facility  Z00.00     2. Type 2 diabetes mellitus with stage 3a chronic kidney disease, without long-term current use of insulin (HCC)  E11.22 Comprehensive metabolic panel   Q46.96 CBC with Differential/Platelet    Hemoglobin A1c    Lipid panel    3. Essential hypertension  I10 Urinalysis, Routine w reflex microscopic    4. Hyperlipidemia associated with type 2 diabetes mellitus (HCC)  E11.69    E78.5     5. Stage 3a chronic kidney disease (HCC)  N18.31     6. Exudative age-related macular degeneration of right eye, unspecified stage (HCC)  H35.3210     7. Edema, unspecified type  R60.9 TSH    B Nat Peptide    Urinalysis, Routine w reflex microscopic      No orders of the defined types were placed in this encounter.   Return precautions advised.  Tana Conch, MD

## 2022-09-25 LAB — HM DIABETES EYE EXAM

## 2022-09-30 ENCOUNTER — Ambulatory Visit (HOSPITAL_COMMUNITY): Payer: Medicare Other | Attending: Internal Medicine

## 2022-09-30 DIAGNOSIS — R609 Edema, unspecified: Secondary | ICD-10-CM | POA: Insufficient documentation

## 2022-09-30 DIAGNOSIS — R0600 Dyspnea, unspecified: Secondary | ICD-10-CM | POA: Diagnosis not present

## 2022-09-30 LAB — ECHOCARDIOGRAM COMPLETE
Area-P 1/2: 2.76 cm2
S' Lateral: 2.9 cm

## 2022-10-09 ENCOUNTER — Telehealth: Payer: Self-pay | Admitting: Family Medicine

## 2022-10-09 NOTE — Telephone Encounter (Signed)
Patient returned call. Requests to be called. 

## 2022-10-10 NOTE — Telephone Encounter (Signed)
Called and spoke with pt and labs reviewed. 

## 2022-12-13 ENCOUNTER — Encounter: Payer: Self-pay | Admitting: Pharmacist

## 2022-12-13 ENCOUNTER — Other Ambulatory Visit: Payer: Self-pay | Admitting: Pharmacist

## 2022-12-13 NOTE — Progress Notes (Signed)
Pharmacy Quality Measure Review  This patient is appearing on a report for being at risk of failing the adherence measure for cholesterol (statin) medications this calendar year.   Medication: atorvastatin  Last fill date: 09/20/2022 for 90 day supply  Patient is due to refill atorvastatin today. Reviewed chart to see if she had refills remaining. She is in need of an updated prescription for atorvastatin and all her maintenance medications - clopidogrel, diltiazem, hydrochlorothiazide and pantoprazole (prescriptions were all last written 10/2021)  Patient has next appointment with PCP 02/28/2023.  Will forward request for updated prescriptions to Dr Durene Cal - see separate encounter for refills.   Henrene Pastor, PharmD Clinical Pharmacist [facility]  Population Health [phone]

## 2022-12-13 NOTE — Telephone Encounter (Signed)
Patient is due to refill atorvastatin. Reviewed chart to see if she had refills remaining. She is in need of an updated prescription for atorvastatin and all her maintenance medications - clopidogrel, diltiazem, hydrochlorothiazide and pantoprazole (prescriptions were all last written 10/2021)  Patient has next appointment with PCP 02/28/2023.  Will forward request for updated prescriptions to Dr Durene Cal - see separate encounter for refills.

## 2022-12-14 MED ORDER — HYDROCHLOROTHIAZIDE 25 MG PO TABS: 25.0000 mg | ORAL_TABLET | Freq: Every day | ORAL | 3 refills | Status: DC

## 2022-12-14 MED ORDER — CLOPIDOGREL BISULFATE 75 MG PO TABS
75.0000 mg | ORAL_TABLET | Freq: Every day | ORAL | 3 refills | Status: DC
Start: 2022-12-14 — End: 2023-09-19

## 2022-12-14 MED ORDER — PANTOPRAZOLE SODIUM 20 MG PO TBEC: 20.0000 mg | DELAYED_RELEASE_TABLET | Freq: Every day | ORAL | 3 refills | Status: DC

## 2022-12-14 MED ORDER — ATORVASTATIN CALCIUM 20 MG PO TABS
20.0000 mg | ORAL_TABLET | ORAL | 3 refills | Status: DC
Start: 2022-12-14 — End: 2023-09-19

## 2022-12-14 MED ORDER — DILTIAZEM HCL ER COATED BEADS 240 MG PO CP24
240.0000 mg | ORAL_CAPSULE | Freq: Every day | ORAL | 3 refills | Status: DC
Start: 2022-12-14 — End: 2023-09-19

## 2023-01-02 IMAGING — DX DG HAND COMPLETE 3+V*R*
3 series · 3 of 3 positions shown · non-contrast
Comparison: None Available.

CLINICAL DATA: Post fall with right fifth digit pain.

EXAM:
RIGHT HAND - COMPLETE 3+ VIEW

[hand ap]
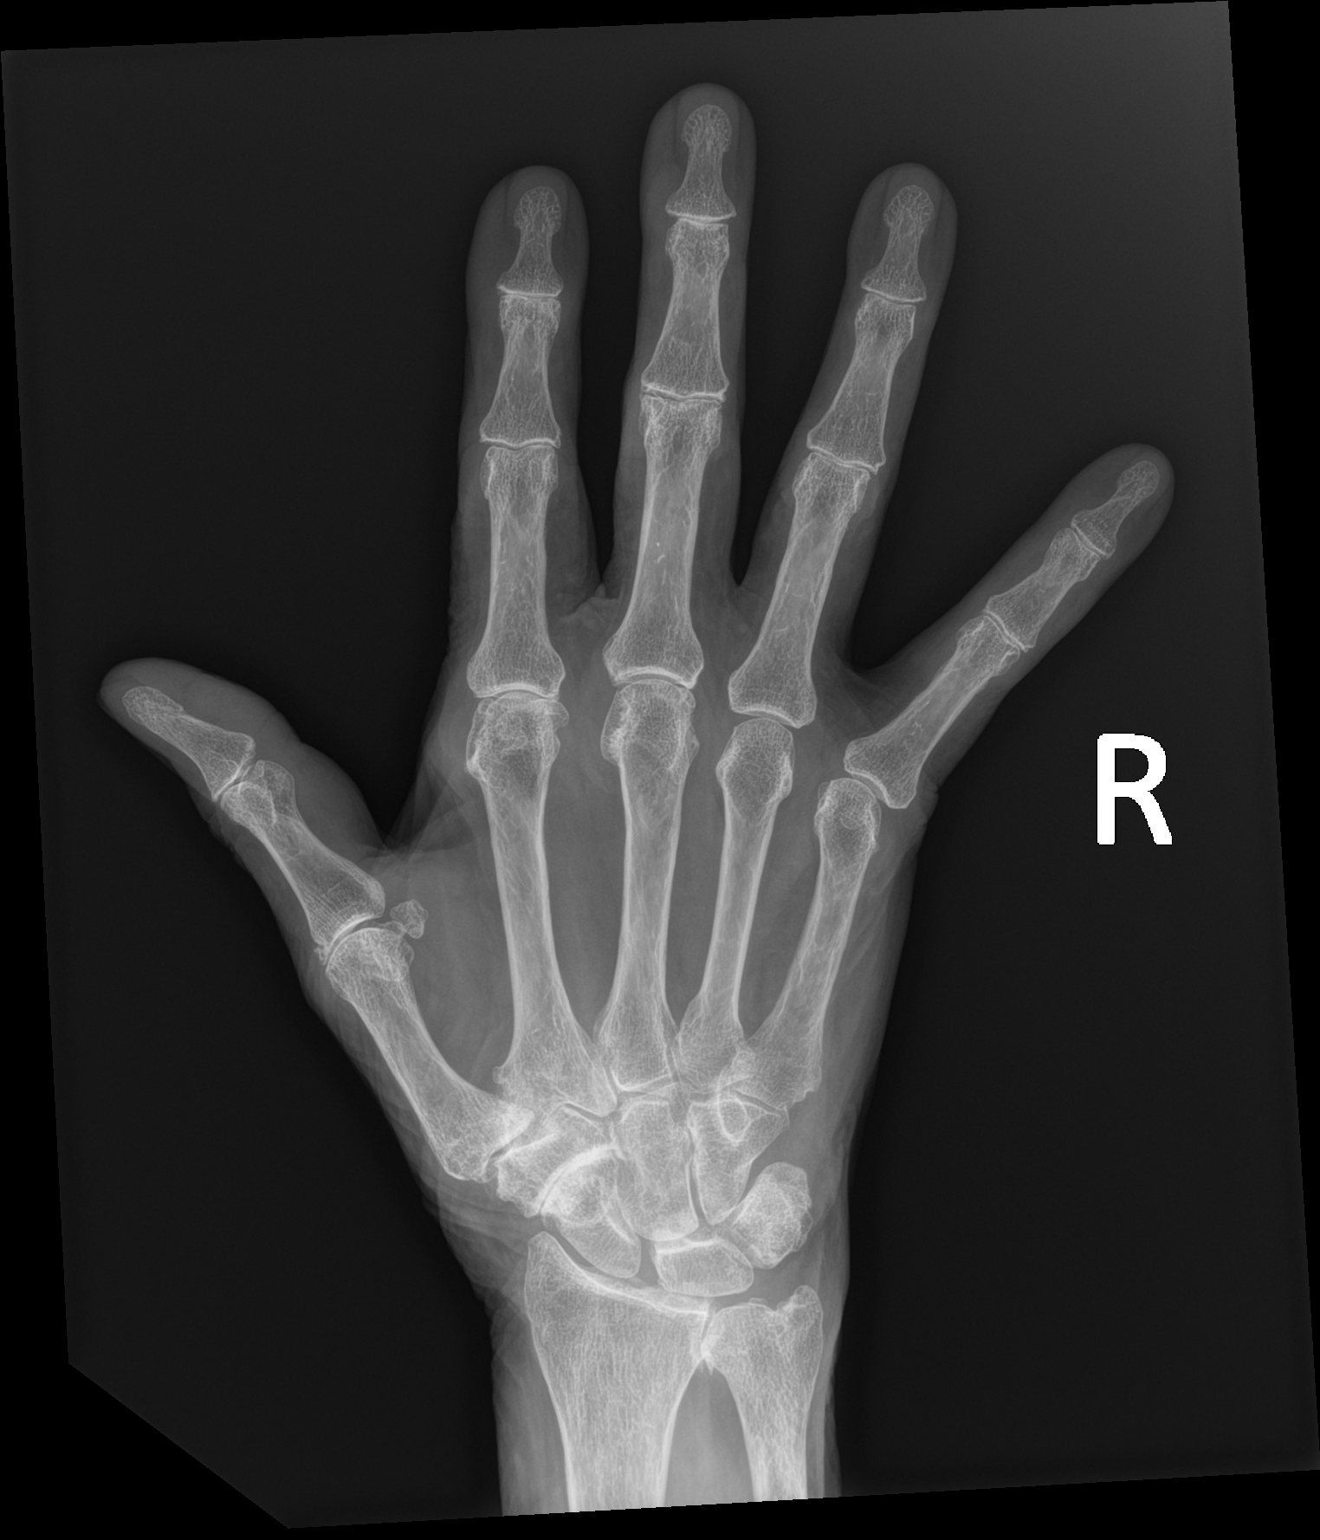

[hand obl]
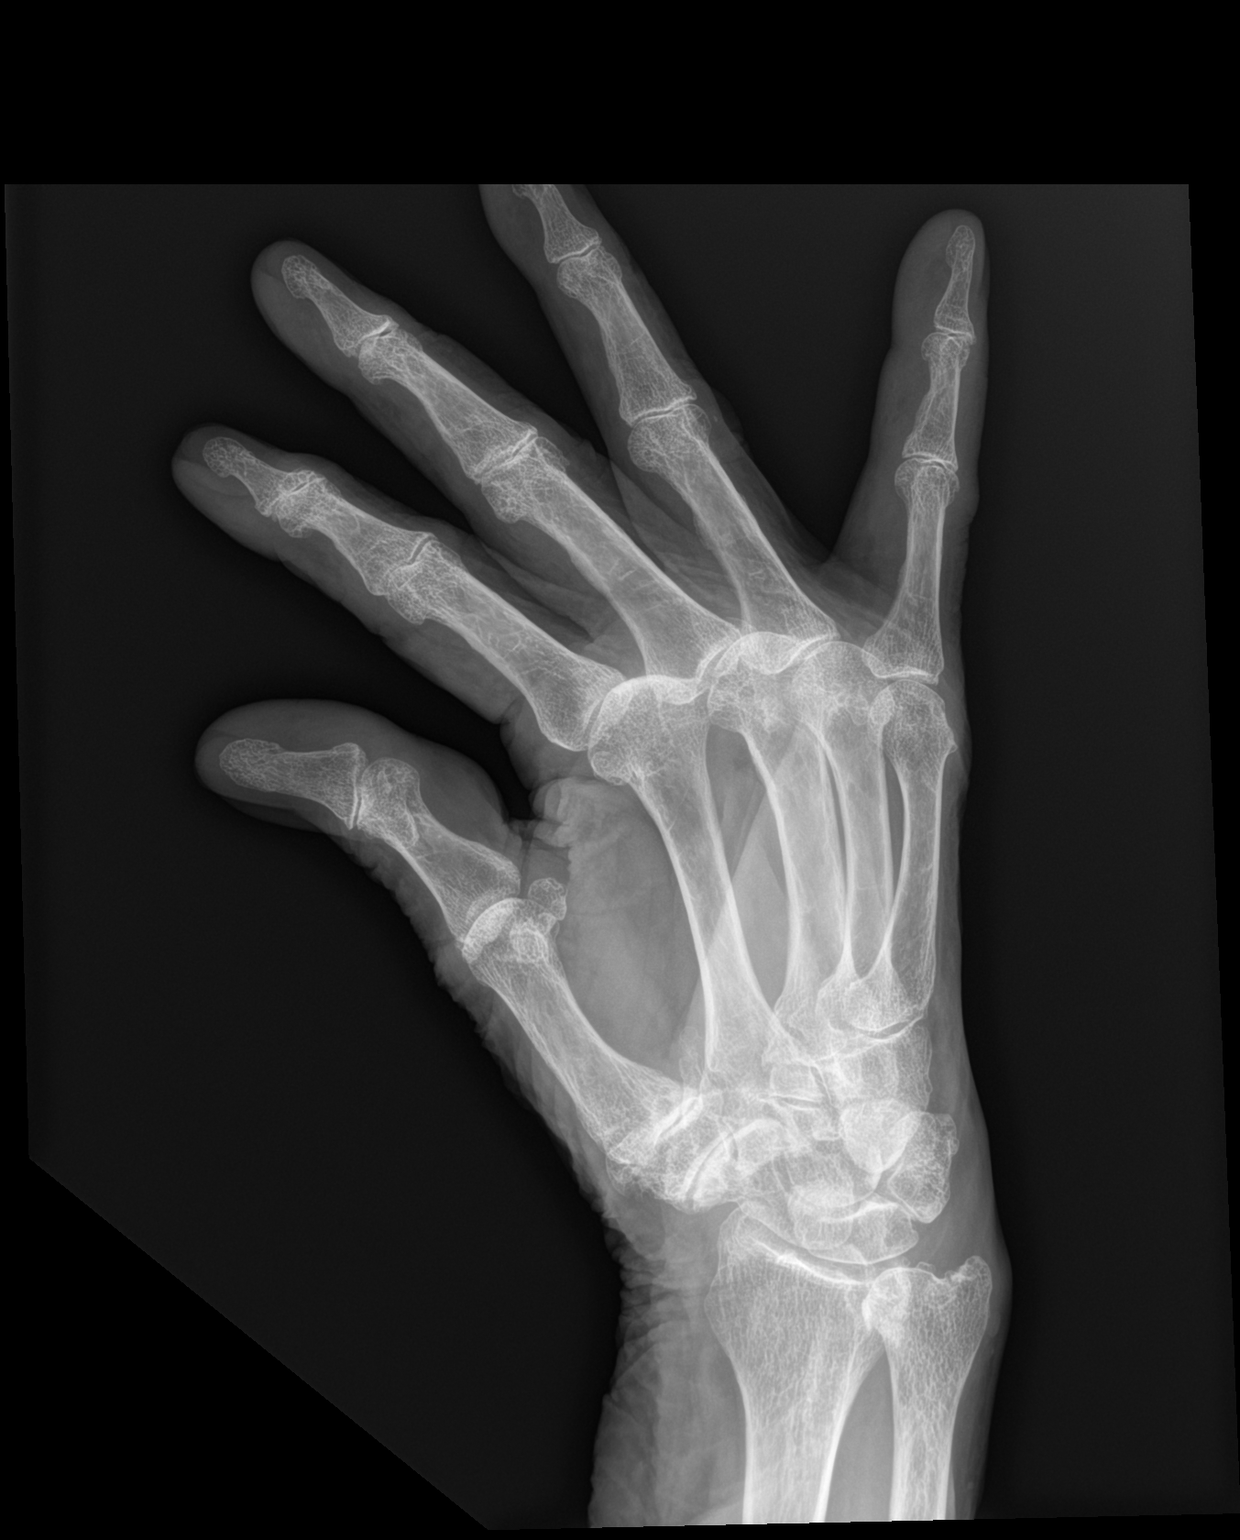

[hand lat]
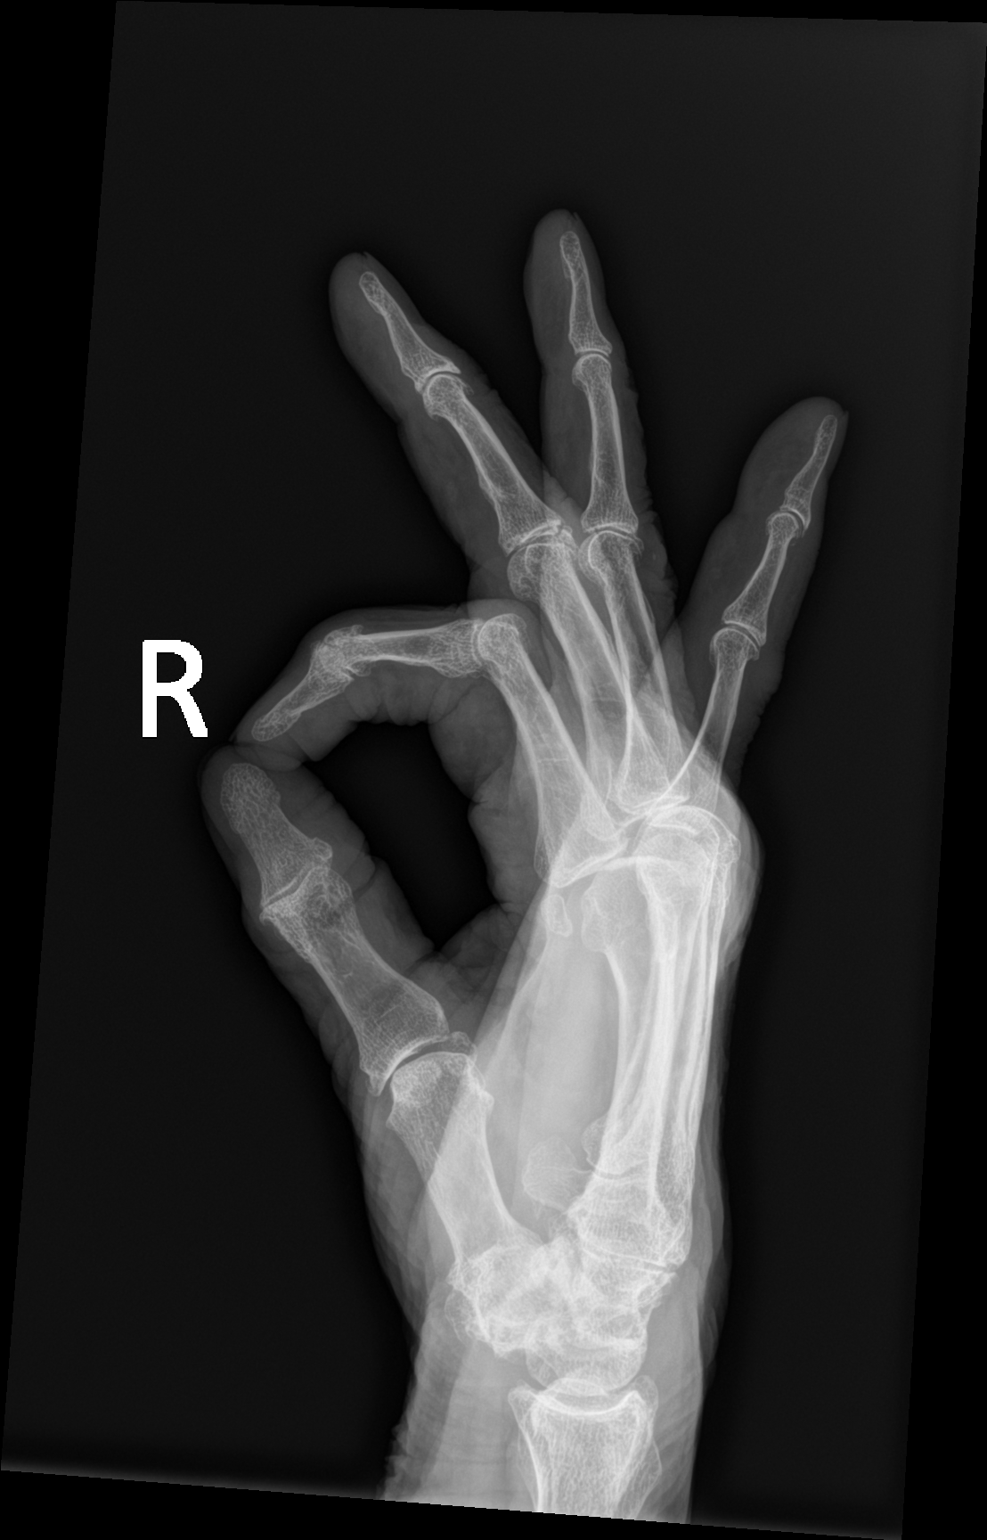

[3 of 3 positions shown; findings below may reference images not displayed]

FINDINGS: There is no evidence of fracture or dislocation. Particularly, no
fracture of the fifth digit. Multifocal osteoarthritis most
prominently involving the thumb carpal metacarpal metacarpal
interphalangeal joints. Scattered degenerative carpal bone cysts.
Degenerative change of the distal radioulnar joint. Soft tissues are
unremarkable.
IMPRESSION: 1. No acute fracture or subluxation of the right hand.
2. Multifocal osteoarthritis.

## 2023-01-05 ENCOUNTER — Encounter: Payer: Self-pay | Admitting: Pharmacist

## 2023-01-05 NOTE — Progress Notes (Signed)
Pharmacy Quality Measure Review  This patient is appearing on a report for being at risk of failing the adherence measure for cholesterol (statin) medications this calendar year.   Medication: atorvastatin 20 mg Last fill date: 10/6 for 84 day supply  Insurance report was not up to date. No action needed at this time.   Jarrett Ables, PharmD PGY-1 Pharmacy Resident

## 2023-02-28 ENCOUNTER — Ambulatory Visit (INDEPENDENT_AMBULATORY_CARE_PROVIDER_SITE_OTHER): Payer: Medicare Other | Admitting: Family Medicine

## 2023-02-28 ENCOUNTER — Encounter: Payer: Self-pay | Admitting: Family Medicine

## 2023-02-28 VITALS — BP 144/74 | Temp 97.7°F | Ht 62.0 in | Wt 168.4 lb

## 2023-02-28 DIAGNOSIS — N1831 Chronic kidney disease, stage 3a: Secondary | ICD-10-CM

## 2023-02-28 DIAGNOSIS — I10 Essential (primary) hypertension: Secondary | ICD-10-CM | POA: Diagnosis not present

## 2023-02-28 DIAGNOSIS — E1122 Type 2 diabetes mellitus with diabetic chronic kidney disease: Secondary | ICD-10-CM

## 2023-02-28 DIAGNOSIS — E785 Hyperlipidemia, unspecified: Secondary | ICD-10-CM

## 2023-02-28 DIAGNOSIS — E1169 Type 2 diabetes mellitus with other specified complication: Secondary | ICD-10-CM | POA: Diagnosis not present

## 2023-02-28 LAB — COMPREHENSIVE METABOLIC PANEL
ALT: 16 U/L (ref 0–35)
AST: 17 U/L (ref 0–37)
Albumin: 4.1 g/dL (ref 3.5–5.2)
Alkaline Phosphatase: 128 U/L — ABNORMAL HIGH (ref 39–117)
BUN: 23 mg/dL (ref 6–23)
CO2: 29 meq/L (ref 19–32)
Calcium: 9.5 mg/dL (ref 8.4–10.5)
Chloride: 102 meq/L (ref 96–112)
Creatinine, Ser: 0.91 mg/dL (ref 0.40–1.20)
GFR: 52.69 mL/min — ABNORMAL LOW (ref >60.00)
Glucose, Bld: 144 mg/dL — ABNORMAL HIGH (ref 70–99)
Potassium: 4 meq/L (ref 3.5–5.1)
Sodium: 142 meq/L (ref 135–145)
Total Bilirubin: 0.4 mg/dL (ref 0.2–1.2)
Total Protein: 7.4 g/dL (ref 6.0–8.3)

## 2023-02-28 LAB — CBC WITH DIFFERENTIAL/PLATELET
Basophils Absolute: 0.1 10*3/uL (ref 0.0–0.1)
Basophils Relative: 0.7 % (ref 0.0–3.0)
Eosinophils Absolute: 0.2 10*3/uL (ref 0.0–0.7)
Eosinophils Relative: 2.8 % (ref 0.0–5.0)
HCT: 42.2 % (ref 36.0–46.0)
Hemoglobin: 14.1 g/dL (ref 12.0–15.0)
Lymphocytes Relative: 26.2 % (ref 12.0–46.0)
Lymphs Abs: 2 10*3/uL (ref 0.7–4.0)
MCHC: 33.4 g/dL (ref 30.0–36.0)
MCV: 90.4 fL (ref 78.0–100.0)
Monocytes Absolute: 0.6 10*3/uL (ref 0.1–1.0)
Monocytes Relative: 8 % (ref 3.0–12.0)
Neutro Abs: 4.7 10*3/uL (ref 1.4–7.7)
Neutrophils Relative %: 62.3 % (ref 43.0–77.0)
Platelets: 310 10*3/uL (ref 150.0–400.0)
RBC: 4.67 Mil/uL (ref 3.87–5.11)
RDW: 13.2 % (ref 11.5–15.5)
WBC: 7.5 10*3/uL (ref 4.0–10.5)

## 2023-02-28 LAB — HEMOGLOBIN A1C: Hgb A1c MFr Bld: 6.7 % — ABNORMAL HIGH (ref 4.6–6.5)

## 2023-02-28 NOTE — Progress Notes (Signed)
Phone [phone] In person visit   Subjective:   [name] is a 89 y.o. year old very pleasant female patient who presents for/with See problem oriented charting Chief Complaint  Patient presents with   Gastroesophageal Reflux   Hypertension   Arthritis    Pt does not have any concerns today     Past Medical History-  Patient Active Problem List   Diagnosis Date Noted   Type 2 diabetes mellitus with diabetic chronic kidney disease (HCC) 06/28/2014    Priority: High   ACP (advance care planning) 12/17/2012    Priority: High   History of CVA in adulthood     Priority: High   Exudative age-related macular degeneration of right eye, unspecified stage (HCC) 08/10/2019    Priority: Medium    CKD (chronic kidney disease), stage III (HCC) 07/29/2017    Priority: Medium    Hyperlipidemia associated with type 2 diabetes mellitus (HCC) 06/28/2014    Priority: Medium    Essential hypertension 12/02/2006    Priority: Medium    Low back pain without sciatica 08/04/2018    Priority: Low   Routine health maintenance 08/25/2011    Priority: Low   GERD 12/02/2006    Priority: Low    Medications- reviewed and updated Current Outpatient Medications  Medication Sig Dispense Refill   atorvastatin (LIPITOR) 20 MG tablet Take 1 tablet (20 mg total) by mouth once a week. 13 tablet 3   Bisacodyl (DULCOLAX PO) Take 1 tablet by mouth as needed.     clopidogrel (PLAVIX) 75 MG tablet Take 1 tablet (75 mg total) by mouth daily. TAKE ONE TABLET BY MOUTH DAILY 90 tablet 3   diltiazem (CARDIZEM CD) 240 MG 24 hr capsule Take 1 capsule (240 mg total) by mouth daily. 90 capsule 3   hydrochlorothiazide (HYDRODIURIL) 25 MG tablet Take 1 tablet (25 mg total) by mouth daily. 90 tablet 3   Multiple Vitamins-Minerals (CENTRUM SILVER) tablet Take 1 tablet by mouth daily.     naproxen sodium (ALEVE) 220 MG tablet Take 220 mg by mouth daily.     pantoprazole (PROTONIX) 20 MG tablet Take 1 tablet (20 mg  total) by mouth daily. 90 tablet 3   No current facility-administered medications for this visit.     Objective:  BP (!) 144/74   Temp 97.7 F (36.5 C)   Ht 5\' 2"  (1.575 m)   Wt 168 lb 6.4 oz (76.4 kg)   SpO2 97%   BMI 30.80 kg/m  Gen: NAD, resting comfortably CV: RRR no murmurs rubs or gallops Lungs: CTAB no crackles, wheeze, rhonchi Abdomen: soft/nontender/nondistended/normal bowel sounds. No rebound or guarding.  Ext: 1+ edema with venous stasis skin changes Neuro: grossly normal other than hard of hearing, moves all extremities, walks with walker     Assessment and Plan   #general report- some more aleve with arthritis and moving slow. She is aware of risks with Plavix and bleeding risk and that it can raise blood pressure but it increases her quality of life so wants to continue.   #History of CVA/stroke-patient followed by Dr. Pearlean Brownie in the past with last visit in 2018.  Patient remains on Plavix which we are prescribing this.   #Edema- has comrpession stockings but hard to get them on- ongoing edema issues at 1+ - stable from last visit. Venous stasis skin changes but can't reach legs well -BNP mildly elevated so we ordered echocardiogramon 09/30/2022 with EF 55 to 60% with moderate tricuspid regurgitation  only -She declines cardiology visit  # Diabetes-typically well controlled with A1c under 7 S: Medication:None-diet controlled CBGs- does not have a meter Lab Results  Component Value Date   HGBA1C 6.6 (H) 08/30/2022   HGBA1C 6.8 (H) 02/20/2022   HGBA1C 6.3 08/22/2021  A/P: hopefully stable- update a1c today. Continue without meds for now   #hyperlipidemia-LDL goal under 70 with history of stroke  S: Medication: Atorvastatin 20mg  once a week  Lab Results  Component Value Date   CHOL 158 08/30/2022   HDL 75.70 08/30/2022   LDLCALC 61 08/30/2022   LDLDIRECT 69.0 01/28/2018   TRIG 106.0 08/30/2022   CHOLHDL 2 08/30/2022   A/P: doing well- continue current  medications   #hypertension S: medication: Cardizem 240mg  extended release, HCTZ 25Mg  Home readings #s: no recent checks but has nurse at [facility] that can check -some higher stress for the holidays and some higher salt intake BP Readings from Last 3 Encounters:  02/28/23 (!) 144/74  08/30/22 138/72  02/20/22 130/60  A/P: blood pressure is slightly high today- after the holidays she will have nurse at [facility] check a few times and send me some readings - for now continue current medications   #Chronic kidney disease stage III S: GFR is typically in the 40s range- 48 most recently  -Patient knows to avoid NSAIDs- but has been taking some for quality of life   A/P: hopefully stable- update bmp today. Continue without meds for now  .    # GERD-history of peptic ulcer.  Failed H2 blocker trial S: Medication: Pantoprazole 20 mg  A/P: stable- continue current medicines   #macular degeneration- receives injections every 3 months   #alk phos slightly high- vitamin D and ggt ok- monitor with labs today- further workup if worsens only  Recommended follow up: Return in about 6 months (around 08/29/2023) for physical or sooner if needed.Schedule b4 you leave. Future Appointments  Date Time Provider Department Center  06/10/2023 11:15 AM [facility] ANNUAL WELLNESS VISIT 1 [facility]    Lab/Order associations:   ICD-10-CM   1. Type 2 diabetes mellitus with stage 3a chronic kidney disease, without long-term current use of insulin (HCC)  E11.22 Comprehensive metabolic panel   K44.01 CBC with Differential/Platelet    Hemoglobin A1c    2. Essential hypertension  I10     3. Hyperlipidemia associated with type 2 diabetes mellitus (HCC)  E11.69    E78.5     4. Stage 3a chronic kidney disease (HCC)  N18.31       No orders of the defined types were placed in this encounter.   Return precautions advised.  Tana Conch, MD

## 2023-02-28 NOTE — Patient Instructions (Addendum)
Please stop by lab before you go If you have mychart- we will send your results within 3 business days of [place] receiving them.  If you do not have mychart- we will call you about results within 5 business days of [place] receiving them.  *please also note that you will see labs on mychart as soon as they post. I will later go in and write notes on them- will say "notes from Dr. Durene Cal"   blood pressure is slightly high today- after the holidays she will have nurse at [facility] check a few times and send me some readings - for now continue current medications   Glad you are doing so well overall! And glad you had a wonderful birthday week!   Recommended follow up: Return in about 6 months (around 08/29/2023) for physical or sooner if needed.Schedule b4 you leave.

## 2023-07-22 ENCOUNTER — Ambulatory Visit (INDEPENDENT_AMBULATORY_CARE_PROVIDER_SITE_OTHER)

## 2023-07-22 VITALS — Ht 62.0 in | Wt 168.0 lb

## 2023-07-22 DIAGNOSIS — Z0000 Encounter for general adult medical examination without abnormal findings: Secondary | ICD-10-CM

## 2023-07-22 NOTE — Progress Notes (Signed)
 Subjective:   [name] is a 89 y.o. who presents for a Medicare Wellness preventive visit.  As a reminder, Annual Wellness Visits don't include a physical exam, and some assessments may be limited, especially if this visit is performed virtually. We may recommend an in-person visit if needed.  Visit Complete: Virtual I connected with  [name] on 07/22/23 by a audio enabled telemedicine application and verified that I am speaking with the correct person using two identifiers.  Patient Location: Home  Provider Location: Office/Clinic  I discussed the limitations of evaluation and management by telemedicine. The patient expressed understanding and agreed to proceed.  Vital Signs: Because this visit was a virtual/telehealth visit, some criteria may be missing or patient reported. Any vitals not documented were not able to be obtained and vitals that have been documented are patient reported.  VideoDeclined- This patient declined Librarian, academic. Therefore the visit was completed with audio only.  Persons Participating in Visit: Patient.  AWV Questionnaire: No: Patient Medicare AWV questionnaire was not completed prior to this visit.  Cardiac Risk Factors include: advanced age (>24men, >54 women);dyslipidemia;diabetes mellitus;hypertension;obesity (BMI >30kg/m2)     Objective:     Today's Vitals   07/22/23 1421  Weight: 168 lb (76.2 kg)  Height: 5\' 2"  (1.575 m)   Body mass index is 30.73 kg/m.     07/22/2023    2:30 PM 06/04/2022   12:04 PM 08/14/2021    5:16 PM 02/11/2020   10:16 AM 12/29/2018    8:38 AM 10/22/2017    4:16 PM 05/11/2014    9:20 AM  Advanced Directives  Does Patient Have a Medical Advance Directive? Yes Yes No Yes Yes Yes   Type of Estate agent of [place];Living will Healthcare Power of [place];Living will  Healthcare Power of [place];Living will Living will Living will Healthcare Power of  [place];Living will  Does patient want to make changes to medical advance directive?     No - Patient declined No - Patient declined   Copy of Healthcare Power of Attorney in Chart? No - copy requested No - copy requested  No - copy requested   No - copy requested  Would patient like information on creating a medical advance directive?   No - Patient declined        Current Medications (verified) Outpatient Encounter Medications as of 07/22/2023  Medication Sig   atorvastatin  (LIPITOR) 20 MG tablet Take 1 tablet (20 mg total) by mouth once a week.   Bisacodyl (DULCOLAX PO) Take 1 tablet by mouth as needed.   clopidogrel  (PLAVIX ) 75 MG tablet Take 1 tablet (75 mg total) by mouth daily. TAKE ONE TABLET BY MOUTH DAILY   diltiazem  (CARDIZEM  CD) 240 MG 24 hr capsule Take 1 capsule (240 mg total) by mouth daily.   hydrochlorothiazide  (HYDRODIURIL ) 25 MG tablet Take 1 tablet (25 mg total) by mouth daily.   Multiple Vitamins-Minerals (CENTRUM SILVER ) tablet Take 1 tablet by mouth daily.   naproxen sodium (ALEVE) 220 MG tablet Take 220 mg by mouth daily.   pantoprazole  (PROTONIX ) 20 MG tablet Take 1 tablet (20 mg total) by mouth daily.   No facility-administered encounter medications on file as of 07/22/2023.    Allergies (verified) Patient has no known allergies.   History: Past Medical History:  Diagnosis Date   Adenomatous polyps    Arthritis    Cerebrovascular accident [facility])    Cervical cancer (HCC)    Cervical  Ca-no radiation or chemo   Diverticulosis    Edema    Effusion of ankle and foot joint    GERD (gastroesophageal reflux disease)    History of UTI    Hypertension    Macular degeneration    Observation for suspected cardiovascular disease    Other abnormal blood chemistry    Peptic ulcer disease    Pyloric stenosis    S/P total abdominal hysterectomy    Special screening for malignant neoplasm of colon    Past Surgical History:  Procedure Laterality Date   ABDOMINAL  HYSTERECTOMY     including cervix   CATARACT EXTRACTION  2008   Dr Matthew Songster   cyst on right hand removed     TEE WITHOUT CARDIOVERSION  11/20/2011   TEE with bubble study-PFO   Family History  Problem Relation Age of Onset   Heart failure Mother    Hypertension Mother    Coronary artery disease Mother    Hypertension Sister    Stroke Brother    Hypertension Maternal Grandmother    Stroke Maternal Grandmother    Hypertension Maternal Grandfather    Social History   Socioeconomic History   Marital status: Widowed    Spouse name: Not on file   Number of children: 0   Years of education: College   Highest education level: Not on file  Occupational History   Occupation: Retired    Associate Professor: RETIRED  Tobacco Use   Smoking status: Former    Current packs/day: 0.00    Types: Cigarettes    Quit date: 03/11/1948    Years since quitting: 75.4   Smokeless tobacco: Never  Vaping Use   Vaping status: Never Used  Substance and Sexual Activity   Alcohol use: Yes    Alcohol/week: 0.0 standard drinks of alcohol    Comment: Occasional wine, not very often   Drug use: No   Sexual activity: Not Currently  Other Topics Concern   Not on file  Social History Narrative   HSG, [employer]. Married '50- widowed after 57 years.   Lives in [facility] [place]- 2 rooms (living room and bedroom and 2 balconies)      On the list [facility]   ACP/End of life: No CPR, no heroic/futile treatment, i.e. prolonged ICU care. HCPA sister [name] tele [phone].      Patient is retired from [employer] after 35 years   Right handed.   Caffeine two cups of coffee daily. Very Rare coke.      Hobbies: play golf, play bridge   Social Drivers of Corporate investment banker Strain: Low Risk  (07/22/2023)   Overall Financial Resource Strain (CARDIA)    Difficulty of Paying Living Expenses: Not hard at all  Food Insecurity: No Food Insecurity (07/22/2023)   Hunger Vital Sign    Worried About  Running Out of Food in the Last Year: Never true    Ran Out of Food in the Last Year: Never true  Transportation Needs: No Transportation Needs (07/22/2023)   PRAPARE - Administrator, Civil Service (Medical): No    Lack of Transportation (Non-Medical): No  Physical Activity: Inactive (07/22/2023)   Exercise Vital Sign    Days of Exercise per Week: 0 days    Minutes of Exercise per Session: 0 min  Stress: No Stress Concern Present (07/22/2023)   [employer] of Occupational Health - Occupational Stress Questionnaire    Feeling of Stress : Not at all  Social Connections: Moderately Integrated (07/22/2023)   Social Connection and Isolation Panel [NHANES]    Frequency of Communication with Friends and Family: More than three times a week    Frequency of Social Gatherings with Friends and Family: Twice a week    Attends Religious Services: More than 4 times per year    Active Member of [employer] or Organizations: Yes    Attends Banker Meetings: 1 to 4 times per year    Marital Status: Widowed    Tobacco Counseling Counseling given: Not Answered    Clinical Intake:  Pre-visit preparation completed: Yes  Pain : No/denies pain     BMI - recorded: 30.73 Nutritional Status: BMI > 30  Obese Diabetes: Yes CBG done?: No Did pt. bring in CBG monitor from home?: No  Lab Results  Component Value Date   HGBA1C 6.7 (H) 02/28/2023   HGBA1C 6.6 (H) 08/30/2022   HGBA1C 6.8 (H) 02/20/2022     How often do you need to have someone help you when you read instructions, pamphlets, or other written materials from your doctor or pharmacy?: 1 - Never  Interpreter Needed?: No  Information entered by :: Lamont Pilsner, LPN   Activities of Daily Living     07/22/2023    2:23 PM  In your present state of health, do you have any difficulty performing the following activities:  Hearing? 1  Comment hearing aids  Vision? 0  Difficulty concentrating or making  decisions? 0  Walking or climbing stairs? 0  Dressing or bathing? 1  Comment sit down to dress  Doing errands, shopping? 0  Preparing Food and eating ? Y  Comment meals prepared  Using the Toilet? N  In the past six months, have you accidently leaked urine? N  Do you have problems with loss of bowel control? N  Managing your Medications? N  Managing your Finances? N  Housekeeping or managing your Housekeeping? N    Patient Care Team: Almira Jaeger, MD as PCP - General (Family Medicine) Oris Birmingham, MD as Consulting Physician (Ophthalmology) [facility], [facility]  Indicate any recent Medical Services you may have received from other than [facility] providers in the past year (date may be approximate).     Assessment:    This is a routine wellness examination for [employer].  Hearing/Vision screen Hearing Screening - Comments:: Hearing aids  Vision Screening - Comments:: Wears rx glasses - up to date with routine eye exams with retina center and Dr Alvina Axon     Goals Addressed             This Visit's Progress    Patient Stated       Maintain health and active        Depression Screen     07/22/2023    2:28 PM 08/30/2022    9:46 AM 06/04/2022   12:02 PM 08/22/2021   10:59 AM 08/14/2020   10:34 AM 02/11/2020   10:13 AM 08/10/2019    9:26 AM  PHQ 2/9 Scores  PHQ - 2 Score 0 0 0 0 0 0 0  PHQ- 9 Score    0 0  0    Fall Risk     07/22/2023    2:31 PM 08/30/2022    9:43 AM 06/04/2022   12:04 PM 08/22/2021   10:58 AM 08/14/2020   10:34 AM  Fall Risk   Falls in the past year? 0 0 0 1 0  Number falls  in past yr: 0  0 0 0  Injury with Fall? 0  0 1 0  Risk for fall due to : Impaired balance/gait;Impaired mobility  Impaired vision;Impaired balance/gait History of fall(s)   Follow up Falls prevention discussed  Falls prevention discussed Falls evaluation completed;Falls prevention discussed     MEDICARE RISK AT HOME:  Medicare Risk at Home Any stairs in  or around the home?: No If so, are there any without handrails?: No Home free of loose throw rugs in walkways, pet beds, electrical cords, etc?: Yes Adequate lighting in your home to reduce risk of falls?: Yes Life alert?: Yes Use of a cane, walker or w/c?: Yes Grab bars in the bathroom?: Yes Shower chair or bench in shower?: Yes Elevated toilet seat or a handicapped toilet?: Yes  TIMED UP AND GO:  Was the test performed?  No  Cognitive Function: 6CIT completed        07/22/2023    2:32 PM 06/04/2022   12:05 PM 02/11/2020   10:21 AM 12/29/2018    8:39 AM 10/22/2017    4:22 PM  6CIT Screen  What Year? 0 points 0 points 0 points 0 points 0 points  What month? 0 points 0 points 0 points 0 points 0 points  What time? 0 points 0 points  0 points 0 points  Count back from 20 0 points 0 points 0 points 0 points 0 points  Months in reverse 0 points 0 points 0 points 0 points 0 points  Repeat phrase 0 points 0 points 0 points 0 points   Total Score 0 points 0 points  0 points     Immunizations Immunization History  Administered Date(s) Administered   Influenza-Unspecified 01/09/2018   Moderna Sars-Covid-2 Vaccination 04/12/2019, 05/10/2019, 01/18/2020, 08/08/2020   Pfizer Covid-19 Vaccine Bivalent Booster 30yrs & up 11/28/2020    Screening Tests Health Maintenance  Topic Date Due   COVID-19 Vaccine (6 - 2024-25 season) 11/10/2022   Pneumonia Vaccine 49+ Years old (1 of 2 - PCV) 08/30/2023 (Originally 02/26/1942)   Zoster Vaccines- Shingrix (1 of 2) 11/30/2023 (Originally 02/26/1942)   DEXA SCAN  06/27/2024 (Originally 02/25/1988)   HEMOGLOBIN A1C  08/29/2023   FOOT EXAM  08/30/2023   OPHTHALMOLOGY EXAM  09/25/2023   INFLUENZA VACCINE  10/10/2023   Medicare Annual Wellness (AWV)  07/21/2024   HPV VACCINES  Aged Out   Meningococcal B Vaccine  Aged Out   DTaP/Tdap/Td  Discontinued    Health Maintenance  Health Maintenance Due  Topic Date Due   COVID-19 Vaccine (6 -  2024-25 season) 11/10/2022   Health Maintenance Items Addressed: See Nurse Notes  Additional Screening:  Vision Screening: Recommended annual ophthalmology exams for early detection of glaucoma and other disorders of the eye.  Dental Screening: Recommended annual dental exams for proper oral hygiene  Community Resource Referral / Chronic Care Management: CRR required this visit?  No   CCM required this visit?  No   Plan:    I have personally reviewed and noted the following in the patient's chart:   Medical and social history Use of alcohol, tobacco or illicit drugs  Current medications and supplements including opioid prescriptions. Patient is not currently taking opioid prescriptions. Functional ability and status Nutritional status Physical activity Advanced directives List of other physicians Hospitalizations, surgeries, and ER visits in previous 12 months Vitals Screenings to include cognitive, depression, and falls Referrals and appointments  In addition, I have reviewed and discussed with patient certain preventive  protocols, quality metrics, and best practice recommendations. A written personalized care plan for preventive services as well as general preventive health recommendations were provided to patient.   Bruno Capri, LPN   2/44/0102   After Visit Summary: (Declined) Due to this being a telephonic visit, with patients personalized plan was offered to patient but patient Declined AVS at this time   Notes: Nothing significant to report at this time.

## 2023-07-22 NOTE — Patient Instructions (Signed)
 [name] , Thank you for taking time out of your busy schedule to complete your Annual Wellness Visit with me. I enjoyed our conversation and look forward to speaking with you again next year. I, as well as your care team,  appreciate your ongoing commitment to your health goals. Please review the following plan we discussed and let me know if I can assist you in the future. Your Game plan/ To Do List    Referrals: If you haven't heard from the office you've been referred to, please reach out to them at the phone provided.   Follow up Visits: Next Medicare AWV with our clinical staff: 07/26/24 @ 3: 00   Have you seen your provider in the last 6 months (3 months if uncontrolled diabetes)? No Next Office Visit with your provider: 09/19/23  Clinician Recommendations:  Aim for 30 minutes of exercise or brisk walking, 6-8 glasses of water, and 5 servings of fruits and vegetables each day.       This is a list of the screening recommended for you and due dates:  Health Maintenance  Topic Date Due   COVID-19 Vaccine (6 - 2024-25 season) 11/10/2022   Pneumonia Vaccine (1 of 2 - PCV) 08/30/2023*   Zoster (Shingles) Vaccine (1 of 2) 11/30/2023*   DEXA scan (bone density measurement)  06/27/2024*   Hemoglobin A1C  08/29/2023   Complete foot exam   08/30/2023   Eye exam for diabetics  09/25/2023   Flu Shot  10/10/2023   Medicare Annual Wellness Visit  07/21/2024   HPV Vaccine  Aged Out   Meningitis B Vaccine  Aged Out   DTaP/Tdap/Td vaccine  Discontinued  *Topic was postponed. The date shown is not the original due date.    Advanced directives: (Copy Requested) Please bring a copy of your health care power of attorney and living will to the office to be added to your chart at your convenience. You can mail to [facility] [address] [place], [place] [address] or email to [email or web address] Advance Care Planning is important because it:  [x]  Makes sure you receive the  medical care that is consistent with your values, goals, and preferences  [x]  It provides guidance to your family and loved ones and reduces their decisional burden about whether or not they are making the right decisions based on your wishes.  Follow the link provided in your after visit summary or read over the paperwork we have mailed to you to help you started getting your Advance Directives in place. If you need assistance in completing these, please reach out to us  so that we can help you!  See attachments for Preventive Care and Fall Prevention Tips.

## 2023-09-19 ENCOUNTER — Encounter: Payer: Self-pay | Admitting: Family Medicine

## 2023-09-19 ENCOUNTER — Ambulatory Visit (INDEPENDENT_AMBULATORY_CARE_PROVIDER_SITE_OTHER): Payer: Medicare Other | Admitting: Family Medicine

## 2023-09-19 ENCOUNTER — Ambulatory Visit: Payer: Self-pay | Admitting: Family Medicine

## 2023-09-19 VITALS — BP 110/70 | HR 75 | Temp 97.3°F | Ht 62.0 in | Wt 167.6 lb

## 2023-09-19 DIAGNOSIS — Z0000 Encounter for general adult medical examination without abnormal findings: Secondary | ICD-10-CM

## 2023-09-19 DIAGNOSIS — E785 Hyperlipidemia, unspecified: Secondary | ICD-10-CM

## 2023-09-19 DIAGNOSIS — Z79899 Other long term (current) drug therapy: Secondary | ICD-10-CM | POA: Diagnosis not present

## 2023-09-19 DIAGNOSIS — N1831 Chronic kidney disease, stage 3a: Secondary | ICD-10-CM

## 2023-09-19 DIAGNOSIS — I10 Essential (primary) hypertension: Secondary | ICD-10-CM | POA: Diagnosis not present

## 2023-09-19 DIAGNOSIS — E1122 Type 2 diabetes mellitus with diabetic chronic kidney disease: Secondary | ICD-10-CM | POA: Diagnosis not present

## 2023-09-19 DIAGNOSIS — H353210 Exudative age-related macular degeneration, right eye, stage unspecified: Secondary | ICD-10-CM

## 2023-09-19 DIAGNOSIS — E1169 Type 2 diabetes mellitus with other specified complication: Secondary | ICD-10-CM | POA: Diagnosis not present

## 2023-09-19 LAB — COMPREHENSIVE METABOLIC PANEL WITH GFR
ALT: 18 U/L (ref 0–35)
AST: 19 U/L (ref 0–37)
Albumin: 4.1 g/dL (ref 3.5–5.2)
Alkaline Phosphatase: 113 U/L (ref 39–117)
BUN: 25 mg/dL — ABNORMAL HIGH (ref 6–23)
CO2: 31 meq/L (ref 19–32)
Calcium: 9.3 mg/dL (ref 8.4–10.5)
Chloride: 101 meq/L (ref 96–112)
Creatinine, Ser: 1.15 mg/dL (ref 0.40–1.20)
GFR: 39.63 mL/min — ABNORMAL LOW (ref >60.00)
Glucose, Bld: 134 mg/dL — ABNORMAL HIGH (ref 70–99)
Potassium: 3.9 meq/L (ref 3.5–5.1)
Sodium: 139 meq/L (ref 135–145)
Total Bilirubin: 0.4 mg/dL (ref 0.2–1.2)
Total Protein: 7.3 g/dL (ref 6.0–8.3)

## 2023-09-19 LAB — LIPID PANEL
Cholesterol: 179 mg/dL (ref 0–200)
HDL: 76.1 mg/dL (ref >39.00)
LDL Cholesterol: 81 mg/dL (ref 0–99)
NonHDL: 103.39
Total CHOL/HDL Ratio: 2
Triglycerides: 114 mg/dL (ref 0.0–149.0)
VLDL: 22.8 mg/dL (ref 0.0–40.0)

## 2023-09-19 LAB — CBC WITH DIFFERENTIAL/PLATELET
Basophils Absolute: 0.1 K/uL (ref 0.0–0.1)
Basophils Relative: 0.7 % (ref 0.0–3.0)
Eosinophils Absolute: 0.2 K/uL (ref 0.0–0.7)
Eosinophils Relative: 2.3 % (ref 0.0–5.0)
HCT: 41.1 % (ref 36.0–46.0)
Hemoglobin: 13.8 g/dL (ref 12.0–15.0)
Lymphocytes Relative: 29 % (ref 12.0–46.0)
Lymphs Abs: 2.3 K/uL (ref 0.7–4.0)
MCHC: 33.6 g/dL (ref 30.0–36.0)
MCV: 88.6 fl (ref 78.0–100.0)
Monocytes Absolute: 0.6 K/uL (ref 0.1–1.0)
Monocytes Relative: 8.1 % (ref 3.0–12.0)
Neutro Abs: 4.7 K/uL (ref 1.4–7.7)
Neutrophils Relative %: 59.9 % (ref 43.0–77.0)
Platelets: 289 K/uL (ref 150.0–400.0)
RBC: 4.64 Mil/uL (ref 3.87–5.11)
RDW: 13.2 % (ref 11.5–15.5)
WBC: 7.9 K/uL (ref 4.0–10.5)

## 2023-09-19 LAB — HEMOGLOBIN A1C: Hgb A1c MFr Bld: 6.9 % — ABNORMAL HIGH (ref 4.6–6.5)

## 2023-09-19 LAB — VITAMIN B12: Vitamin B-12: 658 pg/mL (ref 211–911)

## 2023-09-19 MED ORDER — PANTOPRAZOLE SODIUM 20 MG PO TBEC: 20.0000 mg | DELAYED_RELEASE_TABLET | Freq: Every day | ORAL | 3 refills | Status: AC

## 2023-09-19 MED ORDER — DILTIAZEM HCL ER COATED BEADS 240 MG PO CP24: 240.0000 mg | ORAL_CAPSULE | Freq: Every day | ORAL | 3 refills | Status: AC

## 2023-09-19 MED ORDER — ATORVASTATIN CALCIUM 20 MG PO TABS: 20.0000 mg | ORAL_TABLET | ORAL | 3 refills | Status: DC

## 2023-09-19 MED ORDER — HYDROCHLOROTHIAZIDE 25 MG PO TABS: 25.0000 mg | ORAL_TABLET | Freq: Every day | ORAL | 3 refills | Status: AC

## 2023-09-19 MED ORDER — CLOPIDOGREL BISULFATE 75 MG PO TABS: 75.0000 mg | ORAL_TABLET | Freq: Every day | ORAL | 3 refills | Status: AC

## 2023-09-19 NOTE — Progress Notes (Signed)
 Phone [phone]   Subjective:  Patient presents today for their annual physical. Chief complaint-noted.   See problem oriented charting- ROS- full  review of systems was completed and negative Per full ROS sheet completed by patient except for topics noted under acute/chronic concerns The following were reviewed and entered/updated in epic: Past Medical History:  Diagnosis Date   Adenomatous polyps    Arthritis    Cerebrovascular accident (HCC)    Cervical cancer (HCC)    Cervical Ca-no radiation or chemo   Diverticulosis    Edema    Effusion of ankle and foot joint    GERD (gastroesophageal reflux disease)    History of UTI    Hypertension    Macular degeneration    Observation for suspected cardiovascular disease    Other abnormal blood chemistry    Peptic ulcer disease    Pyloric stenosis    S/P total abdominal hysterectomy    Special screening for malignant neoplasm of colon    Patient Active Problem List   Diagnosis Date Noted   Type 2 diabetes mellitus with diabetic chronic kidney disease (HCC) 06/28/2014    Priority: High   ACP (advance care planning) 12/17/2012    Priority: High   History of CVA in adulthood     Priority: High   Exudative age-related macular degeneration of right eye, unspecified stage (HCC) 08/10/2019    Priority: Medium    CKD (chronic kidney disease), stage III (HCC) 07/29/2017    Priority: Medium    Hyperlipidemia associated with type 2 diabetes mellitus (HCC) 06/28/2014    Priority: Medium    Essential hypertension 12/02/2006    Priority: Medium    Low back pain without sciatica 08/04/2018    Priority: Low   Routine health maintenance 08/25/2011    Priority: Low   GERD 12/02/2006    Priority: Low   Past Surgical History:  Procedure Laterality Date   ABDOMINAL HYSTERECTOMY     including cervix   CATARACT EXTRACTION  2008   Dr Rosan   cyst on right hand removed     TEE WITHOUT CARDIOVERSION  11/20/2011   TEE with  bubble study-PFO    Family History  Problem Relation Age of Onset   Heart failure Mother    Hypertension Mother    Coronary artery disease Mother    Hypertension Sister    Stroke Brother    Hypertension Maternal Grandmother    Stroke Maternal Grandmother    Hypertension Maternal Grandfather     Medications- reviewed and updated Current Outpatient Medications  Medication Sig Dispense Refill   Bisacodyl (DULCOLAX PO) Take 1 tablet by mouth as needed.     Multiple Vitamins-Minerals (CENTRUM SILVER ) tablet Take 1 tablet by mouth daily.     naproxen sodium (ALEVE) 220 MG tablet Take 220 mg by mouth daily.     atorvastatin  (LIPITOR) 20 MG tablet Take 1 tablet (20 mg total) by mouth once a week. 13 tablet 3   clopidogrel  (PLAVIX ) 75 MG tablet Take 1 tablet (75 mg total) by mouth daily. TAKE ONE TABLET BY MOUTH DAILY 90 tablet 3   diltiazem  (CARDIZEM  CD) 240 MG 24 hr capsule Take 1 capsule (240 mg total) by mouth daily. 90 capsule 3   hydrochlorothiazide  (HYDRODIURIL ) 25 MG tablet Take 1 tablet (25 mg total) by mouth daily. 90 tablet 3   pantoprazole  (PROTONIX ) 20 MG tablet Take 1 tablet (20 mg total) by mouth daily. 90 tablet 3   No current facility-administered medications  for this visit.    Allergies-reviewed and updated No Known Allergies  Social History   Social History Narrative   HS[employer], [employer]. Married '50- widowed after 57 years.   [facility] [place]- 2 rooms (living room and bedroom and 2 balconies)      On the list [facility]   ACP/End of life: No CPR, no heroic/futile treatment, i.e. prolonged ICU care. HCPA sister lebron Borer tele [phone].      Patient is retired from [employer] after 35 years   Right handed.   Caffeine two cups of coffee daily. Very Rare coke.      Hobbies: play golf, play bridge   Objective  Objective:  BP 110/70   Pulse 75   Temp (!) 97.3 F (36.3 C)   Ht 5' 2 (1.575 m)   Wt 167 lb 9.6 oz (76 kg)   SpO2 96%   BMI 30.65  kg/m  Gen: NAD, resting comfortably HEENT: Mucous membranes are moist. Oropharynx normal Neck: no thyromegaly CV: RRR no murmurs rubs or gallops Lungs: CTAB no crackles, wheeze, rhonchi Abdomen: soft/nontender/nondistended/normal bowel sounds. No rebound or guarding.  Ext: no edema Skin: warm, dry Neuro: grossly normal other than hard of hearing even with hearing aids,  moves all extremities but walks with walker, PERRLA  Diabetic foot exam was performed with the following findings:   Intact posterior tibialis and dorsalis pedis pulses Callous at base of 1st metatarsal on left- sees podiatry regularly- slight callous on right 2nd toe at distal portion- once again podiatry [facility]  Does not feel monofilament on the foot       Assessment and Plan   89 y.o. female presenting for annual physical.  Health Maintenance counseling: 1. Anticipatory guidance: Patient counseled regarding regular dental exams -q6 months, eye exams ,  avoiding smoking and second hand smoke , limiting alcohol to 1 beverage per day- doesn't drink , no illicit drugs .   2. Risk factor reduction:  Advised patient of need for regular exercise and diet rich and fruits and vegetables to reduce risk of heart attack and stroke.  Exercise- not exercising- encouraged but declines.  Diet/weight management-down 3 lbs from last year- focus on stability.  Wt Readings from Last 3 Encounters:  09/19/23 167 lb 9.6 oz (76 kg)  07/22/23 168 lb (76.2 kg)  02/28/23 168 lb 6.4 oz (76.4 kg)  3. Immunizations/screenings/ancillary studies-declines Prevnar, COVID, Shingrix  Immunization History  Administered Date(s) Administered   Influenza-Unspecified 01/09/2018   Moderna Sars-Covid-2 Vaccination 04/12/2019, 05/10/2019, 01/18/2020, 08/08/2020   Pfizer Covid-19 Vaccine Bivalent Booster 31yrs & up 11/28/2020   4. Cervical cancer screening- past age based screening recommendations  5. Breast cancer screening-  past age  based screening recommendations - prefers self exams either 6. Colon cancer screening -  past age based screening recommendations  7. Skin cancer screening- no dermatologist. advised regular sunscreen use. Denies worrisome, changing, or new skin lesions- has a chronic horned lesion on right leg I told her could be skin cancer- she states she does not want to pursue any intervention even if this is cancer and could lead to more substantial issues- palliative approache 8. Birth control/STD check- not dating 70. Osteoporosis screening at 35- declines DEXA-states would not be interested in medicine if no qualified 10. Smoking associated screening -former smoker-quit 1950 but no regular screening required  Status of chronic or acute concerns   #social update- trying to stay in independent as long as she  can  #hip arthritis- trying to keep going in walker instead of wheelchair .  - she does take sparing aleve- but is aware of bleeding risks and prefers to continue for quality of life - she has increased some- also not ideal with cerebrovascular accident history  -she agrees to alert me or seek care if any bright red blood per rectum or melena.  -arnicare helps some -discussed tramadol option and she declines- would be more ideal with Plavix  as well as lowers risk with CKD III  #History of CVA/stroke-patient followed by Dr. Rosemarie in the past with last visit in 2018.  Patient remains on Plavix  which we are prescribing  #Edema- has comrpession stockings but hard to get them on- ongoing edema issues at 1+  -BNP mildly elevated so we ordered echocardiogramon 09/30/2022 with EF 55 to 60% with moderate tricuspid regurgitation only- has been stable lately but ongoing -trying cerave for skin around ankles- but hard to apply  # Diabetes-typically well controlled with A1c under 7 S: Medication:None-diet controlled CBGs- does not have a meter Lab Results  Component Value Date   HGBA1C 6.7 (H) 02/28/2023    HGBA1C 6.6 (H) 08/30/2022   HGBA1C 6.8 (H) 02/20/2022  A/P: hopefully stable- update a1c today. Continue without meds for now  -foot pain from arthritis- possible neuropathic element as well   #hyperlipidemia-LDL goal under 70 with history of stroke  S: Medication: Atorvastatin  20mg  once a week  Lab Results  Component Value Date   CHOL 158 08/30/2022   HDL 75.70 08/30/2022   LDLCALC 61 08/30/2022   LDLDIRECT 69.0 01/28/2018   TRIG 106.0 08/30/2022   CHOLHDL 2 08/30/2022   A/P: #s looked great last year with LDL under 70- update today  #hypertension S: medication: Cardizem  240mg  extended release, HCTZ 25Mg  BP Readings from Last 3 Encounters:  09/19/23 110/70  02/28/23 (!) 144/74  08/30/22 138/72  A/P: well controlled continue current medications   #Chronic kidney disease stage III S: GFR is typically in the 40s range- last visit in 50s  -Patient knows to avoid NSAIDs  A/P: check today- NSAIDs not ideal     # GERD-history of peptic ulcer.  Failed H2 blocker trial S: Medication: Pantoprazole  20 mg  B12 levels relatedto PPI use: Last check 2022- check today A/P: no recent issues- continue current medications   #macular degeneration- receives injections every 3 months   Recommended follow up: Return in about 6 months (around 03/21/2024) for followup or sooner if needed.Schedule b4 you leave. Future Appointments  Date Time Provider Department Center  07/26/2024  3:00 PM [facility] ANNUAL WELLNESS VISIT 1 [facility]   Lab/Order associations:fasting   ICD-10-CM   1. Preventative health care  Z00.00     2. Type 2 diabetes mellitus with stage 3a chronic kidney disease, without long-term current use of insulin (HCC)  E11.22 Hemoglobin A1c   N18.31     3. Stage 3a chronic kidney disease (HCC)  N18.31     4. Essential hypertension  I10     5. Hyperlipidemia associated with type 2 diabetes mellitus (HCC)  E11.69 Comprehensive metabolic panel with GFR   E78.5 CBC with  Differential/Platelet    Lipid panel    6. High risk medication use  Z79.899 Vitamin B12    7. Exudative age-related macular degeneration of right eye, unspecified stage (HCC) Chronic H35.3210       Meds ordered this encounter  Medications   atorvastatin  (LIPITOR) 20 MG tablet    Sig:  Take 1 tablet (20 mg total) by mouth once a week.    Dispense:  13 tablet    Refill:  3   clopidogrel  (PLAVIX ) 75 MG tablet    Sig: Take 1 tablet (75 mg total) by mouth daily. TAKE ONE TABLET BY MOUTH DAILY    Dispense:  90 tablet    Refill:  3   diltiazem  (CARDIZEM  CD) 240 MG 24 hr capsule    Sig: Take 1 capsule (240 mg total) by mouth daily.    Dispense:  90 capsule    Refill:  3   hydrochlorothiazide  (HYDRODIURIL ) 25 MG tablet    Sig: Take 1 tablet (25 mg total) by mouth daily.    Dispense:  90 tablet    Refill:  3   pantoprazole  (PROTONIX ) 20 MG tablet    Sig: Take 1 tablet (20 mg total) by mouth daily.    Dispense:  90 tablet    Refill:  3    Return precautions advised.  Garnette Lukes, MD

## 2023-09-19 NOTE — Patient Instructions (Addendum)
 Please stop by lab before you go If you have mychart- we will send your results within 3 business days of us  receiving them.  If you do not have mychart- we will call you about results within 5 business days of us  receiving them.  *please also note that you will see labs on mychart as soon as they post. I will later go in and write notes on them- will say notes from Dr. Katrinka Finder you are doing so well!   Recommended follow up: Return in about 6 months (around 03/21/2024) for followup or sooner if needed.Schedule b4 you leave.

## 2023-10-04 ENCOUNTER — Emergency Department (HOSPITAL_COMMUNITY)

## 2023-10-04 ENCOUNTER — Encounter (HOSPITAL_COMMUNITY): Payer: Self-pay

## 2023-10-04 ENCOUNTER — Inpatient Hospital Stay (HOSPITAL_COMMUNITY)
Admission: EM | Admit: 2023-10-04 | Discharge: 2023-10-06 | DRG: 871 | Disposition: A | Discharge status: Nursing Home (Includes ICF) | Source: Skilled Nursing Facility | Attending: Family Medicine | Admitting: Family Medicine

## 2023-10-04 DIAGNOSIS — A4151 Sepsis due to Escherichia coli [E. coli]: Secondary | ICD-10-CM | POA: Diagnosis present

## 2023-10-04 DIAGNOSIS — Z823 Family history of stroke: Secondary | ICD-10-CM

## 2023-10-04 DIAGNOSIS — Z87891 Personal history of nicotine dependence: Secondary | ICD-10-CM

## 2023-10-04 DIAGNOSIS — Z1152 Encounter for screening for COVID-19: Secondary | ICD-10-CM

## 2023-10-04 DIAGNOSIS — I129 Hypertensive chronic kidney disease with stage 1 through stage 4 chronic kidney disease, or unspecified chronic kidney disease: Secondary | ICD-10-CM | POA: Diagnosis present

## 2023-10-04 DIAGNOSIS — G9341 Metabolic encephalopathy: Secondary | ICD-10-CM | POA: Diagnosis present

## 2023-10-04 DIAGNOSIS — Z8249 Family history of ischemic heart disease and other diseases of the circulatory system: Secondary | ICD-10-CM

## 2023-10-04 DIAGNOSIS — Z79899 Other long term (current) drug therapy: Secondary | ICD-10-CM

## 2023-10-04 DIAGNOSIS — H3530 Unspecified macular degeneration: Secondary | ICD-10-CM | POA: Diagnosis present

## 2023-10-04 DIAGNOSIS — N1831 Chronic kidney disease, stage 3a: Secondary | ICD-10-CM | POA: Diagnosis present

## 2023-10-04 DIAGNOSIS — Z66 Do not resuscitate: Secondary | ICD-10-CM | POA: Diagnosis present

## 2023-10-04 DIAGNOSIS — Z7902 Long term (current) use of antithrombotics/antiplatelets: Secondary | ICD-10-CM | POA: Diagnosis not present

## 2023-10-04 DIAGNOSIS — Z8711 Personal history of peptic ulcer disease: Secondary | ICD-10-CM | POA: Diagnosis not present

## 2023-10-04 DIAGNOSIS — A419 Sepsis, unspecified organism: Secondary | ICD-10-CM | POA: Diagnosis present

## 2023-10-04 DIAGNOSIS — E876 Hypokalemia: Secondary | ICD-10-CM | POA: Diagnosis present

## 2023-10-04 DIAGNOSIS — Z8673 Personal history of transient ischemic attack (TIA), and cerebral infarction without residual deficits: Secondary | ICD-10-CM | POA: Diagnosis not present

## 2023-10-04 DIAGNOSIS — N10 Acute tubulo-interstitial nephritis: Secondary | ICD-10-CM | POA: Diagnosis present

## 2023-10-04 DIAGNOSIS — E1122 Type 2 diabetes mellitus with diabetic chronic kidney disease: Secondary | ICD-10-CM | POA: Diagnosis present

## 2023-10-04 DIAGNOSIS — K219 Gastro-esophageal reflux disease without esophagitis: Secondary | ICD-10-CM | POA: Diagnosis present

## 2023-10-04 DIAGNOSIS — E871 Hypo-osmolality and hyponatremia: Secondary | ICD-10-CM | POA: Diagnosis present

## 2023-10-04 DIAGNOSIS — I872 Venous insufficiency (chronic) (peripheral): Secondary | ICD-10-CM | POA: Diagnosis present

## 2023-10-04 DIAGNOSIS — Z8541 Personal history of malignant neoplasm of cervix uteri: Secondary | ICD-10-CM | POA: Diagnosis not present

## 2023-10-04 DIAGNOSIS — E785 Hyperlipidemia, unspecified: Secondary | ICD-10-CM | POA: Diagnosis present

## 2023-10-04 DIAGNOSIS — N390 Urinary tract infection, site not specified: Secondary | ICD-10-CM | POA: Diagnosis not present

## 2023-10-04 DIAGNOSIS — Z87440 Personal history of urinary (tract) infections: Secondary | ICD-10-CM

## 2023-10-04 LAB — CBC
HCT: 39.1 % (ref 36.0–46.0)
Hemoglobin: 13.6 g/dL (ref 12.0–15.0)
MCH: 30.1 pg (ref 26.0–34.0)
MCHC: 34.8 g/dL (ref 30.0–36.0)
MCV: 86.5 fL (ref 80.0–100.0)
Platelets: 251 K/uL (ref 150–400)
RBC: 4.52 MIL/uL (ref 3.87–5.11)
RDW: 12.4 % (ref 11.5–15.5)
WBC: 17 K/uL — ABNORMAL HIGH (ref 4.0–10.5)
nRBC: 0 % (ref 0.0–0.2)

## 2023-10-04 LAB — URINALYSIS, W/ REFLEX TO CULTURE (INFECTION SUSPECTED)
Bilirubin Urine: NEGATIVE
Glucose, UA: NEGATIVE mg/dL
Ketones, ur: NEGATIVE mg/dL
Leukocytes,Ua: NEGATIVE
Nitrite: NEGATIVE
Protein, ur: 100 mg/dL — AB
Specific Gravity, Urine: 1.012 (ref 1.005–1.030)
pH: 6 (ref 5.0–8.0)

## 2023-10-04 LAB — COMPREHENSIVE METABOLIC PANEL WITH GFR
ALT: 22 U/L (ref 0–44)
AST: 25 U/L (ref 15–41)
Albumin: 3.4 g/dL — ABNORMAL LOW (ref 3.5–5.0)
Alkaline Phosphatase: 125 U/L (ref 38–126)
Anion gap: 13 (ref 5–15)
BUN: 19 mg/dL (ref 8–23)
CO2: 25 mmol/L (ref 22–32)
Calcium: 8.7 mg/dL — ABNORMAL LOW (ref 8.9–10.3)
Chloride: 91 mmol/L — ABNORMAL LOW (ref 98–111)
Creatinine, Ser: 0.92 mg/dL (ref 0.44–1.00)
GFR, Estimated: 56 mL/min — ABNORMAL LOW (ref >60)
Glucose, Bld: 149 mg/dL — ABNORMAL HIGH (ref 70–99)
Potassium: 2.6 mmol/L — CL (ref 3.5–5.1)
Sodium: 129 mmol/L — ABNORMAL LOW (ref 135–145)
Total Bilirubin: 1.3 mg/dL — ABNORMAL HIGH (ref 0.0–1.2)
Total Protein: 7.6 g/dL (ref 6.5–8.1)

## 2023-10-04 LAB — RESP PANEL BY RT-PCR (RSV, FLU A&B, COVID)  RVPGX2
Influenza A by PCR: NEGATIVE
Influenza B by PCR: NEGATIVE
Resp Syncytial Virus by PCR: NEGATIVE
SARS Coronavirus 2 by RT PCR: NEGATIVE

## 2023-10-04 LAB — MAGNESIUM: Magnesium: 2 mg/dL (ref 1.7–2.4)

## 2023-10-04 LAB — I-STAT CG4 LACTIC ACID, ED: Lactic Acid, Venous: 1.6 mmol/L (ref 0.5–1.9)

## 2023-10-04 MED ORDER — LACTATED RINGERS IV BOLUS (SEPSIS)
500.0000 mL | Freq: Once | INTRAVENOUS | Status: AC
  Administered 2023-10-04: 500 mL via INTRAVENOUS

## 2023-10-04 MED ORDER — SODIUM CHLORIDE 0.9 % IV SOLN
2.0000 g | Freq: Once | INTRAVENOUS | Status: AC
  Administered 2023-10-04: 2 g via INTRAVENOUS
  Filled 2023-10-04: qty 20

## 2023-10-04 MED ORDER — SODIUM CHLORIDE 0.9 % IV SOLN: INTRAVENOUS | Status: AC

## 2023-10-04 MED ORDER — HYDROCHLOROTHIAZIDE 25 MG PO TABS
25.0000 mg | ORAL_TABLET | Freq: Every day | ORAL | Status: DC
  Administered 2023-10-04: 25 mg via ORAL
  Filled 2023-10-04: qty 1

## 2023-10-04 MED ORDER — POTASSIUM CHLORIDE CRYS ER 20 MEQ PO TBCR
40.0000 meq | EXTENDED_RELEASE_TABLET | ORAL | Status: AC
  Administered 2023-10-04 (×2): 40 meq via ORAL
  Filled 2023-10-04 (×2): qty 2

## 2023-10-04 MED ORDER — LACTATED RINGERS IV SOLN: INTRAVENOUS | Status: DC

## 2023-10-04 MED ORDER — METRONIDAZOLE 500 MG/100ML IV SOLN
500.0000 mg | Freq: Once | INTRAVENOUS | Status: AC
  Administered 2023-10-04: 500 mg via INTRAVENOUS
  Filled 2023-10-04: qty 100

## 2023-10-04 MED ORDER — IOHEXOL 300 MG/ML  SOLN
100.0000 mL | Freq: Once | INTRAMUSCULAR | Status: AC | PRN
  Administered 2023-10-04: 100 mL via INTRAVENOUS

## 2023-10-04 MED ORDER — MAGNESIUM SULFATE 2 GM/50ML IV SOLN
2.0000 g | Freq: Once | INTRAVENOUS | Status: AC
  Administered 2023-10-04: 2 g via INTRAVENOUS
  Filled 2023-10-04: qty 50

## 2023-10-04 MED ORDER — ONDANSETRON HCL 4 MG/2ML IJ SOLN
4.0000 mg | Freq: Once | INTRAMUSCULAR | Status: AC
  Administered 2023-10-04: 4 mg via INTRAVENOUS
  Filled 2023-10-04: qty 2

## 2023-10-04 MED ORDER — DILTIAZEM HCL ER COATED BEADS 240 MG PO CP24
240.0000 mg | ORAL_CAPSULE | Freq: Every day | ORAL | Status: DC
  Administered 2023-10-04 – 2023-10-06 (×3): 240 mg via ORAL
  Filled 2023-10-04: qty 2
  Filled 2023-10-04 (×2): qty 1

## 2023-10-04 MED ORDER — VANCOMYCIN HCL IN DEXTROSE 1-5 GM/200ML-% IV SOLN
1000.0000 mg | Freq: Once | INTRAVENOUS | Status: AC
  Administered 2023-10-05: 1000 mg via INTRAVENOUS
  Filled 2023-10-04: qty 200

## 2023-10-04 MED ORDER — ACETAMINOPHEN 500 MG PO TABS
1000.0000 mg | ORAL_TABLET | Freq: Once | ORAL | Status: AC
  Administered 2023-10-04: 1000 mg via ORAL
  Filled 2023-10-04: qty 2

## 2023-10-04 NOTE — ED Notes (Signed)
 Family updated as to patient's status per FYI in patient's chart allows

## 2023-10-04 NOTE — ED Provider Notes (Signed)
 [place] [facility] AT [facility] Provider Note   CSN: [id number] Arrival date & time: 10/04/23  1752     Patient presents with: Urinary Retention and Nausea   [name] is a 89 y.o. female.   Patient is a 89 year old female with a history of prior CVA, hypertension, GERD, chronic lower extremity swelling who lives in assisted living and coming in today with complaint of not feeling well over the last 2 days.  [name] reports [name] has been confused and has just not felt herself with some mild nausea, decreased oral intake.  [name] does notice frequent urination but denies any abdominal pain or dysuria.  [name] does not think [name] has had a fever and denies any cough or shortness of breath.  [name] had a headache yesterday but denies any significant headache currently.  [name] has not taken any of her medications today.  Is still been able to get up and move around with her walker.  The history is provided by the patient and medical records.       Prior to Admission medications   Medication Sig Start Date End Date Taking? Authorizing Provider  atorvastatin  (LIPITOR) 20 MG tablet Take 1 tablet (20 mg total) by mouth once a week. 09/19/23   Katrinka Garnette KIDD, MD  Bisacodyl  (DULCOLAX PO) Take 1 tablet by mouth as needed.    [provider]  clopidogrel  (PLAVIX ) 75 MG tablet Take 1 tablet (75 mg total) by mouth daily. TAKE ONE TABLET BY MOUTH DAILY 09/19/23   Katrinka Garnette KIDD, MD  diltiazem  (CARDIZEM  CD) 240 MG 24 hr capsule Take 1 capsule (240 mg total) by mouth daily. 09/19/23   Katrinka Garnette KIDD, MD  hydrochlorothiazide  (HYDRODIURIL ) 25 MG tablet Take 1 tablet (25 mg total) by mouth daily. 09/19/23   Katrinka Garnette KIDD, MD  Multiple Vitamins-Minerals (CENTRUM SILVER ) tablet Take 1 tablet by mouth daily.    [provider]  naproxen sodium (ALEVE) 220 MG tablet Take 220 mg by mouth daily.    [provider]  pantoprazole  (PROTONIX ) 20 MG tablet Take 1 tablet  (20 mg total) by mouth daily. 09/19/23   Katrinka Garnette KIDD, MD    Allergies: Patient has no known allergies.    Review of Systems  Updated Vital Signs BP (!) 161/59   Pulse 78   Temp 100 F (37.8 C) (Oral)   Resp (!) 26   SpO2 96%   Physical Exam Vitals and nursing note reviewed.  Constitutional:      General: [name] is not in acute distress.    Appearance: [name] is well-developed.  HENT:     Head: Normocephalic and atraumatic.     Mouth/Throat:     Mouth: Mucous membranes are dry.  Eyes:     Pupils: Pupils are equal, round, and reactive to light.  Cardiovascular:     Rate and Rhythm: Normal rate and regular rhythm.     Heart sounds: Normal heart sounds. No murmur heard.    No friction rub.  Pulmonary:     Effort: Pulmonary effort is normal.     Breath sounds: Normal breath sounds. No wheezing or rales.  Abdominal:     General: Bowel sounds are normal. There is no distension.     Palpations: Abdomen is soft.     Tenderness: There is no abdominal tenderness. There is no guarding or rebound.  Musculoskeletal:        General: No tenderness. Normal range of motion.  Cervical back: Normal range of motion and neck supple.     Right lower leg: Edema present.     Left lower leg: Edema present.     Comments: Skin changes bilaterally of venous stasis with eyrthema bilateral but no induration pain or drainage  Skin:    General: Skin is warm and dry.     Findings: No rash.  Neurological:     Mental Status: [name] is alert and oriented to person, place, and time.     Cranial Nerves: No cranial nerve deficit.     Sensory: No sensory deficit.     Motor: No weakness.     Comments: Mild word finding difficulty  Psychiatric:        Mood and Affect: Mood normal.        Behavior: Behavior normal.     (all labs ordered are listed, but only abnormal results are displayed) Labs Reviewed  CBC - Abnormal; Notable for the following components:      Result Value   WBC 17.0 (*)    All  other components within normal limits  URINALYSIS, W/ REFLEX TO CULTURE (INFECTION SUSPECTED) - Abnormal; Notable for the following components:   APPearance HAZY (*)    Hgb urine dipstick SMALL (*)    Protein, ur 100 (*)    Bacteria, UA MANY (*)    All other components within normal limits  COMPREHENSIVE METABOLIC PANEL WITH GFR - Abnormal; Notable for the following components:   Sodium 129 (*)    Potassium 2.6 (*)    Chloride 91 (*)    Glucose, Bld 149 (*)    Calcium  8.7 (*)    Albumin 3.4 (*)    Total Bilirubin 1.3 (*)    GFR, Estimated 56 (*)    All other components within normal limits  RESP PANEL BY RT-PCR (RSV, FLU A&B, COVID)  RVPGX2  CULTURE, BLOOD (SINGLE)  MAGNESIUM   I-STAT CG4 LACTIC ACID, ED  I-STAT CG4 LACTIC ACID, ED    EKG: EKG Interpretation Date/Time:  Saturday October 04 2023 18:03:43 EDT Ventricular Rate:  79 PR Interval:  178 QRS Duration:  102 QT Interval:  422 QTC Calculation: 484 R Axis:   47  Text Interpretation: Sinus rhythm Probable right ventricular hypertrophy No significant change since last tracing Confirmed by Doretha Folks ([id number]) on 10/04/2023 6:23:06 PM  Radiology: [facility] Chest Port 1 View Result Date: 10/04/2023 CLINICAL DATA:  Sepsis, confusion, painful urinary retention. Nausea. EXAM: PORTABLE CHEST 1 VIEW COMPARISON:  11/18/2011 FINDINGS: Atherosclerotic calcification of the aortic arch. The patient is rotated to the right on today's radiograph, reducing diagnostic sensitivity and specificity. Mild rightward deviation of the upper trachea, possibly related to a left thyroid  goiter. The lungs appear clear. No blunting of the costophrenic angles. Upper normal heart size. Moderate degenerative glenohumeral arthropathy bilaterally. IMPRESSION: 1. No acute findings. 2. Mild rightward deviation of the upper trachea, possibly related to a left thyroid  goiter. 3. Moderate degenerative glenohumeral arthropathy bilaterally. 4. Aortic Atherosclerosis  (ICD10-I70.0). Electronically Signed   By: Ryan Salvage M.D.   On: 10/04/2023 20:17   CT Head Wo Contrast Result Date: 10/04/2023 CLINICAL DATA:  Mental status change, unknown cause EXAM: CT HEAD WITHOUT CONTRAST TECHNIQUE: Contiguous axial images were obtained from the base of the skull through the vertex without intravenous contrast. RADIATION DOSE REDUCTION: This exam was performed according to the departmental dose-optimization program which includes automated exposure control, adjustment of the mA and/or kV according to patient size and/or use  of iterative reconstruction technique. COMPARISON:  08/14/2021 FINDINGS: Brain: There is atrophy and chronic small vessel disease changes. Old left thalamic and right basal ganglia lacunar infarcts. No acute intracranial abnormality. Specifically, no hemorrhage, hydrocephalus, mass lesion, acute infarction, or significant intracranial injury. Vascular: No hyperdense vessel or unexpected calcification. Skull: No acute calvarial abnormality. Sinuses/Orbits: No acute findings Other: None IMPRESSION: Atrophy, chronic microvascular disease. No acute intracranial abnormality. Electronically Signed   By: Franky Crease M.D.   On: 10/04/2023 19:55     Procedures   Medications Ordered in the ED  lactated ringers  infusion ( Intravenous New Bag/Given 10/04/23 2215)  metroNIDAZOLE  (FLAGYL ) IVPB 500 mg (has no administration in time range)  vancomycin  (VANCOCIN ) IVPB 1000 mg/200 mL premix (has no administration in time range)  lactated ringers  bolus 500 mL (0 mLs Intravenous Stopped 10/04/23 2130)  ondansetron  (ZOFRAN ) injection 4 mg (4 mg Intravenous Given 10/04/23 2027)  acetaminophen  (TYLENOL ) tablet 1,000 mg (1,000 mg Oral Given 10/04/23 2026)  magnesium  sulfate IVPB 2 g 50 mL (2 g Intravenous New Bag/Given 10/04/23 2032)  potassium chloride  SA (KLOR-CON  M) CR tablet 40 mEq (40 mEq Oral Given 10/04/23 2141)  cefTRIAXone  (ROCEPHIN ) 2 g in sodium chloride  0.9 % 100  mL IVPB (2 g Intravenous New Bag/Given 10/04/23 2141)  iohexol  (OMNIPAQUE ) 300 MG/ML solution 100 mL (100 mLs Intravenous Contrast Given 10/04/23 2236)                                    Medical Decision Making Amount and/or Complexity of Data Reviewed Labs: ordered. Decision-making details documented in ED Course. Radiology: ordered and independent interpretation performed. Decision-making details documented in ED Course. ECG/medicine tests: ordered and independent interpretation performed. Decision-making details documented in ED Course.  Risk OTC drugs. Prescription drug management.   Pt with multiple medical problems and comorbidities and presenting today with a complaint that caries a high risk for morbidity and mortality.  Here today with the above complaints.  Patient is hypertensive here but does report [name] has not taken her medications today which does include blood pressure medicine.  Concern for stroke versus dehydration versus infectious etiology.  Lower suspicion for ACS.  [name] has no abdominal pain at this time to suggest obstruction, diverticulitis, cholecystitis or hepatitis.  Undifferentiated sepsis initiated.  Will also scan the brain to ensure no new signs of stroke or bleed. Rectal temperature here is 101.4. I independently interpreted patient's labs and EKG.  EKG without acute findings, CBC with leukocytosis of 17, normal hemoglobin and platelet count, respiratory viral panel is negative, CMP with hyponatremia of 129, hypokalemia of 2.6 normal creatinine and BUN.  I have independently visualized and interpreted pt's images today.  Chest x-ray without acute findings and head CT is negative for acute bleed.  Radiology reports head CT with atrophy and chronic microvascular disease but otherwise normal.  Patient covered with Rocephin  due to concern for possible UTI.  Code sepsis was initiated.  Patient was given potassium magnesium  replacement.  [name] will need admission.  This was  discussed with the patient and [name] is comfortable with this plan.  Lactic acid was normal.  UA with many bacterial but not frank signs of infection.  Pt abx coverage was broadened for fever without a source.  Will admit for further care.  CT of abd/pelvis to ensure no other acute findings.  Pt will be admitted for further care and hospitalist consulted.  Final diagnoses:  Sepsis without acute organ dysfunction, due to unspecified organism [facility])    ED Discharge Orders     None          Doretha Folks, MD 10/04/23 2249

## 2023-10-04 NOTE — ED Notes (Signed)
 Admitting Provider at bedside.

## 2023-10-04 NOTE — H&P (Incomplete)
 History and Physical  [name] FMW:[id number] DOB: February 01, 1923 DOA: 10/04/2023  PCP: Katrinka Garnette KIDD, MD   Chief Complaint: Increased confusion, weakness, urinary frequency  HPI: [name] is a 89 y.o. female with medical history significant for CVA, HTN, arthritis, cervical cancer, GERD, venous stasis dermatitis, and PUD who presents to the ED for evaluation of increased confusion, generalized weakness and urinary frequency. Patient reports she has felt more confused over the last 2 days with inability to focus and not feeling like herself. She has had decreased oral intake due to not feeling well and did not take any of her medications today. She endorsed generalized weakness, mild nausea and urinary frequency but denies any dysuria, fevers, chills, headache, shortness of breath, cough, abdominal pain, vomiting, chest pain or back pain. Patient slightly hard of hearing but able to answer all orientation questions correctly  ED Course: Initial vitals show temp 99.1-101.4, RR 18-26, HR 60-80s, SBP 160-190s, SpO2 93% on room air. Initial labs significant for sodium 129, K+ 2.6, creatinine 0.92, WBC 17.0, lactic acid 1.6, mag 2.0, negative flu, RSV and COVID test, UA shows small hemoglobinuria, negative nitrite, negative leuks, WBC 6-10 and many bacteria. EKG shows sinus rhythm with PACs. CXR shows no cardiopulmonary disease. CT head with no acute intercranial abnormality. CT A/P shows findings worrisome for pyelonephritis. Pt received oral KCl 40 mEq x 2, IV mag, IV Zofran  4 mg x 1, IV LR 1 L bolus followed by infusion, IV vancomycin , IV Rocephin  and IV Flagyl . TRH was consulted for admission.   Review of Systems: Please see HPI for pertinent positives and negatives. A complete 10 system review of systems are otherwise negative.  Past Medical History:  Diagnosis Date  . Adenomatous polyps   . Arthritis   . Cerebrovascular accident (HCC)   . Cervical cancer (HCC)    Cervical Ca-no radiation  or chemo  . Diverticulosis   . Edema   . Effusion of ankle and foot joint   . GERD (gastroesophageal reflux disease)   . History of UTI   . Hypertension   . Macular degeneration   . Observation for suspected cardiovascular disease   . Other abnormal blood chemistry   . Peptic ulcer disease   . Pyloric stenosis   . S/P total abdominal hysterectomy   . Special screening for malignant neoplasm of colon    Past Surgical History:  Procedure Laterality Date  . ABDOMINAL HYSTERECTOMY     including cervix  . CATARACT EXTRACTION  2008   Dr Rosan  . cyst on right hand removed    . TEE WITHOUT CARDIOVERSION  11/20/2011   TEE with bubble study-PFO   Social History:  reports that she quit smoking about 75 years ago. Her smoking use included cigarettes. She has never used smokeless tobacco. She reports current alcohol use. She reports that she does not use drugs.  No Known Allergies  Family History  Problem Relation Age of Onset  . Heart failure Mother   . Hypertension Mother   . Coronary artery disease Mother   . Hypertension Sister   . Stroke Brother   . Hypertension Maternal Grandmother   . Stroke Maternal Grandmother   . Hypertension Maternal Grandfather      Prior to Admission medications   Medication Sig Start Date End Date Taking? Authorizing Provider  atorvastatin  (LIPITOR) 20 MG tablet Take 1 tablet (20 mg total) by mouth once a week. 09/19/23   Katrinka Garnette KIDD, MD  Bisacodyl  (  DULCOLAX PO) Take 1 tablet by mouth as needed.    [provider]  clopidogrel  (PLAVIX ) 75 MG tablet Take 1 tablet (75 mg total) by mouth daily. TAKE ONE TABLET BY MOUTH DAILY 09/19/23   Katrinka Garnette KIDD, MD  diltiazem  (CARDIZEM  CD) 240 MG 24 hr capsule Take 1 capsule (240 mg total) by mouth daily. 09/19/23   Katrinka Garnette KIDD, MD  hydrochlorothiazide  (HYDRODIURIL ) 25 MG tablet Take 1 tablet (25 mg total) by mouth daily. 09/19/23   Katrinka Garnette KIDD, MD  Multiple Vitamins-Minerals  (CENTRUM SILVER ) tablet Take 1 tablet by mouth daily.    [provider]  naproxen sodium (ALEVE) 220 MG tablet Take 220 mg by mouth daily.    [provider]  pantoprazole  (PROTONIX ) 20 MG tablet Take 1 tablet (20 mg total) by mouth daily. 09/19/23   Katrinka Garnette KIDD, MD    Physical Exam: BP (!) 150/59   Pulse 79   Temp 100 F (37.8 C) (Oral)   Resp 19   Ht 5' 2 (1.575 m)   Wt 76 kg   SpO2 94%   BMI 30.65 kg/m  General: Pleasant, lethargic appearing elderly woman laying in bed. No acute distress. HEENT: [place]/AT. Anicteric sclera. Dry mucous membrane. CV: RRR. No murmurs, rubs, or gallops. Trace BLE edema Pulmonary: On 2 L [place]. Lungs CTAB. Normal effort. No wheezing or rales. Abdominal: Soft, nontender, nondistended. No CVA tenderness. Normal bowel sounds. Extremities: Palpable radial and DP pulses. Normal ROM. Skin: Warm and dry. Venous stasis dermatitis of the lower extremities with mild erythema. Decreased skin turgor. Neuro: Lethargic but oriented to self, place, person and time. Slightly delayed thought process and word finding difficulty. Moves all extremities. Normal sensation to light touch. No focal deficit. Psych: Normal mood and affect          Labs on Admission:  Basic Metabolic Panel: Recent Labs  Lab 10/04/23 1900 10/04/23 2049  NA 129*  --   K 2.6*  --   CL 91*  --   CO2 25  --   GLUCOSE 149*  --   BUN 19  --   CREATININE 0.92  --   CALCIUM  8.7*  --   MG  --  2.0   Liver Function Tests: Recent Labs  Lab 10/04/23 1900  AST 25  ALT 22  ALKPHOS 125  BILITOT 1.3*  PROT 7.6  ALBUMIN 3.4*   No results for input(s): LIPASE, AMYLASE in the last 168 hours. No results for input(s): AMMONIA in the last 168 hours. CBC: Recent Labs  Lab 10/04/23 1900  WBC 17.0*  HGB 13.6  HCT 39.1  MCV 86.5  PLT 251   Cardiac Enzymes: No results for input(s): CKTOTAL, CKMB, CKMBINDEX, TROPONINI in the last 168 hours. BNP (last 3  results) No results for input(s): BNP in the last 8760 hours.  ProBNP (last 3 results) No results for input(s): PROBNP in the last 8760 hours.  CBG: No results for input(s): GLUCAP in the last 168 hours.  Radiological Exams on Admission: CT ABDOMEN PELVIS W CONTRAST Result Date: 10/04/2023 CLINICAL DATA:  Sepsis EXAM: CT ABDOMEN AND PELVIS WITH CONTRAST TECHNIQUE: Multidetector CT imaging of the abdomen and pelvis was performed using the standard protocol following bolus administration of intravenous contrast. RADIATION DOSE REDUCTION: This exam was performed according to the departmental dose-optimization program which includes automated exposure control, adjustment of the mA and/or kV according to patient size and/or use of iterative reconstruction technique. CONTRAST:  100mL  OMNIPAQUE  IOHEXOL  300 MG/ML  SOLN COMPARISON:  None Available. FINDINGS: Lower chest: There is atelectasis in the left lung base. Hepatobiliary: The gallstones are likely present. There is no biliary ductal dilatation. The liver is within normal limits. Pancreas: There is a rounded 7 mm hypodense area in the body of the pancreas. The pancreas is otherwise within normal limits. No ductal dilatation or acute inflammation. Spleen: Normal in size without focal abnormality. Adrenals/Urinary Tract: There is a 9.3 cm cyst in the right kidney. The right kidney otherwise appears within normal limits. There is duplicated left renal collecting system. There is some wall enhancement of the proximal left ureter and renal pelvis. There is mild decreased enhancement throughout the lower portion of the kidney. Superior portion of the kidney appears normal. There is no hydronephrosis. The bladder and adrenal glands are within normal limits. Stomach/Bowel: Stomach is within normal limits. Appendix appears normal. No evidence of bowel wall thickening, distention, or inflammatory changes. There is sigmoid colon diverticulosis.  Vascular/Lymphatic: Aortic atherosclerosis. No enlarged abdominal or pelvic lymph nodes. Reproductive: Status post hysterectomy. No adnexal masses. Other: There is trace free fluid in the pelvis. Musculoskeletal: Degenerative changes affect the spine. There severe degenerative changes of the right hip. IMPRESSION: 1. Duplicated left renal collecting system. There is wall enhancement of the inferior proximal left ureter and renal pelvis worrisome for infection. There is mild decreased enhancement throughout the lower portion of the left kidney worrisome for pyelonephritis. 2. Trace free fluid in the pelvis. 3. Cholelithiasis. 4. 7 mm hypodense area in the body of the pancreas. Recommend follow-up MRI in 6 months. 5. Sigmoid colon diverticulosis. 6. Aortic atherosclerosis. Aortic Atherosclerosis (ICD10-I70.0). Electronically Signed   By: Greig Pique M.D.   On: 10/04/2023 23:02   DG Chest Port 1 View Result Date: 10/04/2023 CLINICAL DATA:  Sepsis, confusion, painful urinary retention. Nausea. EXAM: PORTABLE CHEST 1 VIEW COMPARISON:  11/18/2011 FINDINGS: Atherosclerotic calcification of the aortic arch. The patient is rotated to the right on today's radiograph, reducing diagnostic sensitivity and specificity. Mild rightward deviation of the upper trachea, possibly related to a left thyroid  goiter. The lungs appear clear. No blunting of the costophrenic angles. Upper normal heart size. Moderate degenerative glenohumeral arthropathy bilaterally. IMPRESSION: 1. No acute findings. 2. Mild rightward deviation of the upper trachea, possibly related to a left thyroid  goiter. 3. Moderate degenerative glenohumeral arthropathy bilaterally. 4. Aortic Atherosclerosis (ICD10-I70.0). Electronically Signed   By: Ryan Salvage M.D.   On: 10/04/2023 20:17   CT Head Wo Contrast Result Date: 10/04/2023 CLINICAL DATA:  Mental status change, unknown cause EXAM: CT HEAD WITHOUT CONTRAST TECHNIQUE: Contiguous axial images were  obtained from the base of the skull through the vertex without intravenous contrast. RADIATION DOSE REDUCTION: This exam was performed according to the departmental dose-optimization program which includes automated exposure control, adjustment of the mA and/or kV according to patient size and/or use of iterative reconstruction technique. COMPARISON:  08/14/2021 FINDINGS: Brain: There is atrophy and chronic small vessel disease changes. Old left thalamic and right basal ganglia lacunar infarcts. No acute intracranial abnormality. Specifically, no hemorrhage, hydrocephalus, mass lesion, acute infarction, or significant intracranial injury. Vascular: No hyperdense vessel or unexpected calcification. Skull: No acute calvarial abnormality. Sinuses/Orbits: No acute findings Other: None IMPRESSION: Atrophy, chronic microvascular disease. No acute intracranial abnormality. Electronically Signed   By: Franky Crease M.D.   On: 10/04/2023 19:55   Assessment/Plan [name] is a 89 y.o. female with medical history significant for CVA, HTN, arthritis,  cervical cancer, GERD, venous stasis dermatitis, and PUD who presents to the ED for evaluation of increased confusion, generalized weakness and urinary frequency and admitted for sepsis secondary to pyelonephritis.  # Sepsis # Pyelonephritis - Presented with increased confusion, generalized weakness, nausea and urinary frequency - UA shows negative nitrite and leuks but many bacteria and WBC 6-10 - CT A/P shows findings concerning for pyelonephritis - Met sepsis criteria with fever, leukocytosis and evidence of pyelonephritis - Continue IV Rocephin  - Start IV hydration with IV NS@125  /hr - Follow-up blood and urine cultures - Trend CBC, fever curve  # Hyponatremia - Sodium of 129 on admission from normal baseline - Pt lethargic with slight word finding difficulty but fully oriented - Likely secondary to hypovolemic hyponatremia in the setting of recent decreased  p.o. intake - Continue IV NS infusion as above - Check urine sodium and urine osm - F/u repeat sodium  # Hypokalemia - K+ low at 2.6 on admission, repleted with KCl 80 mEq in the ED - F/u morning potassium and mag  # Acute metabolic encephalopathy - Reported increased confusion and not feeling like herself - On neuroexam, patient oriented x 4 but with lethargy, mild delayed thought process and word finding difficulty - CT head with no acute intracranial abnormalities - AMS likely secondary to urinary infection and hyponatremia - Delirium precautions  # Severe asymptomatic hypertension - BP elevated with SBP in the 160s to 190s - Patient reports she has not taken her BP meds today - Continue HCTZ and diltiazem   # T2DM - A1c of 6.9% 2 weeks ago -SSI with meals, CBG monitoring  # Hx of CVA # HLD - Continue Plavix  and atorvastatin   # GERD - Continue Protonix   # Generalized weakness - In the setting of acute illness - PT/OT eval and treat - Fall precautions    DVT prophylaxis: Lovenox      Code Status: Limited: Do not attempt resuscitation (DNR) -DNR-LIMITED -Do Not Intubate/DNI   Consults called: None  Family Communication: No family at bedside  Severity of Illness: The appropriate patient status for this patient is INPATIENT. Inpatient status is judged to be reasonable and necessary in order to provide the required intensity of service to ensure the patient's safety. The patient's presenting symptoms, physical exam findings, and initial radiographic and laboratory data in the context of their chronic comorbidities is felt to place them at high risk for further clinical deterioration. Furthermore, it is not anticipated that the patient will be medically stable for discharge from the hospital within 2 midnights of admission.   * I certify that at the point of admission it is my clinical judgment that the patient will require inpatient hospital care spanning beyond 2  midnights from the point of admission due to high intensity of service, high risk for further deterioration and high frequency of surveillance required.*  Level of care: Telemetry   This record has been created using Conservation officer, historic buildings. Errors have been sought and corrected, but may not always be located. Such creation errors do not reflect on the standard of care.   Lou Claretta HERO, MD 10/05/2023, 12:02 AM [facility] Pager: [phone] Isaiah 41:10   If 7PM-7AM, please contact night-coverage [email or web address] Password [email or web address]

## 2023-10-04 NOTE — ED Notes (Signed)
 Patient transported to CT

## 2023-10-04 NOTE — Sepsis Progress Note (Signed)
 Elink following for sepsis protocol.

## 2023-10-04 NOTE — H&P (Incomplete)
 History and Physical  [name] FMW:[id number] DOB: Sep 27, 1922 DOA: 10/04/2023  PCP: Katrinka Garnette KIDD, MD   Chief Complaint: Increased confusion, weakness, urinary frequency  HPI: [name] is a 89 y.o. female with medical history significant for CVA, HTN, T2DM, arthritis, cervical cancer, GERD, venous stasis dermatitis, and PUD who presents to the ED for evaluation of increased confusion, generalized weakness and urinary frequency. Patient reports she has felt more confused over the last 2 days with inability to focus and not feeling like herself. She has had decreased oral intake due to not feeling well and did not take any of her medications today. She endorsed generalized weakness, mild nausea and urinary frequency but denies any dysuria, fevers, chills, headache, shortness of breath, cough, abdominal pain, vomiting, chest pain or back pain. Patient slightly hard of hearing but able to answer all orientation questions correctly  ED Course: Initial vitals show temp 99.1-101.4, RR 18-26, HR 60-80s, SBP 160-190s, SpO2 93% on room air. Initial labs significant for sodium 129, K+ 2.6, creatinine 0.92, WBC 17.0, lactic acid 1.6, mag 2.0, negative flu, RSV and COVID test, UA shows small hemoglobinuria, negative nitrite, negative leuks, WBC 6-10 and many bacteria. EKG shows sinus rhythm with PACs. CXR shows no cardiopulmonary disease. CT head with no acute intercranial abnormality. CT A/P shows findings worrisome for pyelonephritis. Pt received oral KCl 40 mEq x 2, IV mag, IV Zofran  4 mg x 1, IV LR 1 L bolus followed by infusion, IV vancomycin , IV Rocephin  and IV Flagyl . TRH was consulted for admission.   Review of Systems: Please see HPI for pertinent positives and negatives. A complete 10 system review of systems are otherwise negative.  Past Medical History:  Diagnosis Date   Adenomatous polyps    Arthritis    Cerebrovascular accident [facility])    Cervical cancer (HCC)    Cervical Ca-no  radiation or chemo   Diverticulosis    Edema    Effusion of ankle and foot joint    GERD (gastroesophageal reflux disease)    History of UTI    Hypertension    Macular degeneration    Observation for suspected cardiovascular disease    Other abnormal blood chemistry    Peptic ulcer disease    Pyloric stenosis    S/P total abdominal hysterectomy    Special screening for malignant neoplasm of colon    Past Surgical History:  Procedure Laterality Date   ABDOMINAL HYSTERECTOMY     including cervix   CATARACT EXTRACTION  2008   Dr Rosan   cyst on right hand removed     TEE WITHOUT CARDIOVERSION  11/20/2011   TEE with bubble study-PFO   Social History:  reports that she quit smoking about 75 years ago. Her smoking use included cigarettes. She has never used smokeless tobacco. She reports current alcohol use. She reports that she does not use drugs.  No Known Allergies  Family History  Problem Relation Age of Onset   Heart failure Mother    Hypertension Mother    Coronary artery disease Mother    Hypertension Sister    Stroke Brother    Hypertension Maternal Grandmother    Stroke Maternal Grandmother    Hypertension Maternal Grandfather      Prior to Admission medications   Medication Sig Start Date End Date Taking? Authorizing Provider  atorvastatin  (LIPITOR) 20 MG tablet Take 1 tablet (20 mg total) by mouth once a week. 09/19/23   Katrinka Garnette KIDD, MD  Bisacodyl  (DULCOLAX PO) Take 1 tablet by mouth as needed.    [provider]  clopidogrel  (PLAVIX ) 75 MG tablet Take 1 tablet (75 mg total) by mouth daily. TAKE ONE TABLET BY MOUTH DAILY 09/19/23   Katrinka Garnette KIDD, MD  diltiazem  (CARDIZEM  CD) 240 MG 24 hr capsule Take 1 capsule (240 mg total) by mouth daily. 09/19/23   Katrinka Garnette KIDD, MD  hydrochlorothiazide  (HYDRODIURIL ) 25 MG tablet Take 1 tablet (25 mg total) by mouth daily. 09/19/23   Katrinka Garnette KIDD, MD  Multiple Vitamins-Minerals (CENTRUM SILVER )  tablet Take 1 tablet by mouth daily.    [provider]  naproxen sodium (ALEVE) 220 MG tablet Take 220 mg by mouth daily.    [provider]  pantoprazole  (PROTONIX ) 20 MG tablet Take 1 tablet (20 mg total) by mouth daily. 09/19/23   Katrinka Garnette KIDD, MD    Physical Exam: BP (!) 150/59   Pulse 79   Temp 100 F (37.8 C) (Oral)   Resp 19   Ht 5' 2 (1.575 m)   Wt 76 kg   SpO2 94%   BMI 30.65 kg/m  General: Pleasant, lethargic appearing elderly woman laying in bed. No acute distress. HEENT: [place]/AT. Anicteric sclera. Dry mucous membrane. CV: RRR. No murmurs, rubs, or gallops. Trace BLE edema Pulmonary: On 2 L [place]. Lungs CTAB. Normal effort. No wheezing or rales. Abdominal: Soft, nontender, nondistended. No CVA tenderness. Normal bowel sounds. Extremities: Palpable radial and DP pulses. Normal ROM. Skin: Warm and dry. Venous stasis dermatitis of the lower extremities with mild erythema. Decreased skin turgor. Neuro: Lethargic but oriented to self, place, person and time. Slightly delayed thought process and word finding difficulty. Moves all extremities. Normal sensation to light touch. No focal deficit. Psych: Normal mood and affect          Labs on Admission:  Basic Metabolic Panel: Recent Labs  Lab 10/04/23 1900 10/04/23 2049  NA 129*  --   K 2.6*  --   CL 91*  --   CO2 25  --   GLUCOSE 149*  --   BUN 19  --   CREATININE 0.92  --   CALCIUM  8.7*  --   MG  --  2.0   Liver Function Tests: Recent Labs  Lab 10/04/23 1900  AST 25  ALT 22  ALKPHOS 125  BILITOT 1.3*  PROT 7.6  ALBUMIN 3.4*   No results for input(s): LIPASE, AMYLASE in the last 168 hours. No results for input(s): AMMONIA in the last 168 hours. CBC: Recent Labs  Lab 10/04/23 1900  WBC 17.0*  HGB 13.6  HCT 39.1  MCV 86.5  PLT 251   Cardiac Enzymes: No results for input(s): CKTOTAL, CKMB, CKMBINDEX, TROPONINI in the last 168 hours. BNP (last 3 results) No results  for input(s): BNP in the last 8760 hours.  ProBNP (last 3 results) No results for input(s): PROBNP in the last 8760 hours.  CBG: No results for input(s): GLUCAP in the last 168 hours.  Radiological Exams on Admission: CT ABDOMEN PELVIS W CONTRAST Result Date: 10/04/2023 CLINICAL DATA:  Sepsis EXAM: CT ABDOMEN AND PELVIS WITH CONTRAST TECHNIQUE: Multidetector CT imaging of the abdomen and pelvis was performed using the standard protocol following bolus administration of intravenous contrast. RADIATION DOSE REDUCTION: This exam was performed according to the departmental dose-optimization program which includes automated exposure control, adjustment of the mA and/or kV according to patient size and/or use of iterative reconstruction technique. CONTRAST:  OMNIPAQUE  IOHEXOL  300 MG/ML  SOLN COMPARISON:  None Available. FINDINGS: Lower chest: There is atelectasis in the left lung base. Hepatobiliary: The gallstones are likely present. There is no biliary ductal dilatation. The liver is within normal limits. Pancreas: There is a rounded 7 mm hypodense area in the body of the pancreas. The pancreas is otherwise within normal limits. No ductal dilatation or acute inflammation. Spleen: Normal in size without focal abnormality. Adrenals/Urinary Tract: There is a 9.3 cm cyst in the right kidney. The right kidney otherwise appears within normal limits. There is duplicated left renal collecting system. There is some wall enhancement of the proximal left ureter and renal pelvis. There is mild decreased enhancement throughout the lower portion of the kidney. Superior portion of the kidney appears normal. There is no hydronephrosis. The bladder and adrenal glands are within normal limits. Stomach/Bowel: Stomach is within normal limits. Appendix appears normal. No evidence of bowel wall thickening, distention, or inflammatory changes. There is sigmoid colon diverticulosis. Vascular/Lymphatic: Aortic  atherosclerosis. No enlarged abdominal or pelvic lymph nodes. Reproductive: Status post hysterectomy. No adnexal masses. Other: There is trace free fluid in the pelvis. Musculoskeletal: Degenerative changes affect the spine. There severe degenerative changes of the right hip. IMPRESSION: 1. Duplicated left renal collecting system. There is wall enhancement of the inferior proximal left ureter and renal pelvis worrisome for infection. There is mild decreased enhancement throughout the lower portion of the left kidney worrisome for pyelonephritis. 2. Trace free fluid in the pelvis. 3. Cholelithiasis. 4. 7 mm hypodense area in the body of the pancreas. Recommend follow-up MRI in 6 months. 5. Sigmoid colon diverticulosis. 6. Aortic atherosclerosis. Aortic Atherosclerosis (ICD10-I70.0). Electronically Signed   By: Greig Pique M.D.   On: 10/04/2023 23:02   DG Chest Port 1 View Result Date: 10/04/2023 CLINICAL DATA:  Sepsis, confusion, painful urinary retention. Nausea. EXAM: PORTABLE CHEST 1 VIEW COMPARISON:  11/18/2011 FINDINGS: Atherosclerotic calcification of the aortic arch. The patient is rotated to the right on today's radiograph, reducing diagnostic sensitivity and specificity. Mild rightward deviation of the upper trachea, possibly related to a left thyroid  goiter. The lungs appear clear. No blunting of the costophrenic angles. Upper normal heart size. Moderate degenerative glenohumeral arthropathy bilaterally. IMPRESSION: 1. No acute findings. 2. Mild rightward deviation of the upper trachea, possibly related to a left thyroid  goiter. 3. Moderate degenerative glenohumeral arthropathy bilaterally. 4. Aortic Atherosclerosis (ICD10-I70.0). Electronically Signed   By: Ryan Salvage M.D.   On: 10/04/2023 20:17   CT Head Wo Contrast Result Date: 10/04/2023 CLINICAL DATA:  Mental status change, unknown cause EXAM: CT HEAD WITHOUT CONTRAST TECHNIQUE: Contiguous axial images were obtained from the base of the  skull through the vertex without intravenous contrast. RADIATION DOSE REDUCTION: This exam was performed according to the departmental dose-optimization program which includes automated exposure control, adjustment of the mA and/or kV according to patient size and/or use of iterative reconstruction technique. COMPARISON:  08/14/2021 FINDINGS: Brain: There is atrophy and chronic small vessel disease changes. Old left thalamic and right basal ganglia lacunar infarcts. No acute intracranial abnormality. Specifically, no hemorrhage, hydrocephalus, mass lesion, acute infarction, or significant intracranial injury. Vascular: No hyperdense vessel or unexpected calcification. Skull: No acute calvarial abnormality. Sinuses/Orbits: No acute findings Other: None IMPRESSION: Atrophy, chronic microvascular disease. No acute intracranial abnormality. Electronically Signed   By: Franky Crease M.D.   On: 10/04/2023 19:55   Assessment/Plan [name] is a 89 y.o. female with medical history significant for CVA, HTN,  T2DM, arthritis, cervical cancer, GERD, venous stasis dermatitis, and PUD who presents to the ED for evaluation of increased confusion, generalized weakness and urinary frequency and admitted for sepsis secondary to pyelonephritis.  # Sepsis # Pyelonephritis - Presented with increased confusion, generalized weakness, nausea and urinary frequency - UA shows negative nitrite and leuks but many bacteria and WBC 6-10 - CT A/P shows findings concerning for pyelonephritis - Met sepsis criteria with fever, leukocytosis and evidence of pyelonephritis - Continue IV Rocephin  - Start IV hydration with IV NS@125  /hr for 1 day - Follow-up blood and urine cultures - Trend CBC, fever curve  # Hyponatremia - Sodium of 129 on admission from normal baseline - Pt lethargic with slight word finding difficulty but fully oriented - Likely secondary to decreased fluid intake in the setting of hydrochlorothiazide  use -  Continue IV NS infusion as above - Check urine sodium and urine osm - F/u repeat sodium  # Hypokalemia - K+ low at 2.6 on admission, repleted with KCl 80 mEq in the ED - F/u morning potassium, mag and phos  # Acute metabolic encephalopathy - Reported increased confusion and not feeling like herself - On neuroexam, patient oriented x 4 but with lethargy, mild delayed thought process and word finding difficulty (unclear if this is different from her baseline) - CT head with no acute intracranial abnormalities - AMS likely 2/2 combination of urinary infection and hyponatremia - Delirium precautions  # Severe asymptomatic hypertension - BP elevated with SBP in the 160s to 190s - Patient reports she has not taken her BP meds today - Continue diltiazem  - Hold HCTZ in the setting of hyponatremia  # T2DM - A1c of 6.9% 2 weeks ago, not on any treatment for this likely due to her advanced age - SSI with meals, CBG monitoring  # Hx of CVA # HLD - Continue Plavix  and atorvastatin   # GERD # Hx of PUD - Continue Protonix   # Generalized weakness - In the setting of acute illness - Start protein supplementation - PT/OT eval and treat - Fall precautions   DVT prophylaxis: Lovenox      Code Status: Limited: Do not attempt resuscitation (DNR) -DNR-LIMITED -Do Not Intubate/DNI   Consults called: None  Family Communication: No family at bedside  Severity of Illness: The appropriate patient status for this patient is INPATIENT. Inpatient status is judged to be reasonable and necessary in order to provide the required intensity of service to ensure the patient's safety. The patient's presenting symptoms, physical exam findings, and initial radiographic and laboratory data in the context of their chronic comorbidities is felt to place them at high risk for further clinical deterioration. Furthermore, it is not anticipated that the patient will be medically stable for discharge from the  hospital within 2 midnights of admission.   * I certify that at the point of admission it is my clinical judgment that the patient will require inpatient hospital care spanning beyond 2 midnights from the point of admission due to high intensity of service, high risk for further deterioration and high frequency of surveillance required.*  Level of care: Telemetry   This record has been created using Conservation officer, historic buildings. Errors have been sought and corrected, but may not always be located. Such creation errors do not reflect on the standard of care.   Lou Claretta HERO, MD 10/05/2023, 12:02 AM [facility] Pager: [phone] Isaiah 41:10   If 7PM-7AM, please contact night-coverage [email or web address] Password [email or web address]

## 2023-10-04 NOTE — ED Notes (Signed)
 Family updated as to patient's status.

## 2023-10-04 NOTE — ED Triage Notes (Signed)
 Per EMS from [facility] at [place]. Increasing confusion over past two days. Painful urinary retention with nausea.   BP 188/102 RR 18 HR 80 95 on RA CBG 169 T 100.9

## 2023-10-05 ENCOUNTER — Other Ambulatory Visit: Payer: Self-pay

## 2023-10-05 ENCOUNTER — Encounter (HOSPITAL_COMMUNITY): Payer: Self-pay | Admitting: Student

## 2023-10-05 DIAGNOSIS — E876 Hypokalemia: Secondary | ICD-10-CM

## 2023-10-05 DIAGNOSIS — G9341 Metabolic encephalopathy: Secondary | ICD-10-CM

## 2023-10-05 DIAGNOSIS — A419 Sepsis, unspecified organism: Secondary | ICD-10-CM | POA: Diagnosis not present

## 2023-10-05 DIAGNOSIS — E871 Hypo-osmolality and hyponatremia: Secondary | ICD-10-CM

## 2023-10-05 DIAGNOSIS — N390 Urinary tract infection, site not specified: Secondary | ICD-10-CM | POA: Diagnosis not present

## 2023-10-05 DIAGNOSIS — N10 Acute tubulo-interstitial nephritis: Secondary | ICD-10-CM

## 2023-10-05 LAB — CBC
HCT: 38.9 % (ref 36.0–46.0)
Hemoglobin: 13.1 g/dL (ref 12.0–15.0)
MCH: 30 pg (ref 26.0–34.0)
MCHC: 33.7 g/dL (ref 30.0–36.0)
MCV: 89 fL (ref 80.0–100.0)
Platelets: 242 K/uL (ref 150–400)
RBC: 4.37 MIL/uL (ref 3.87–5.11)
RDW: 12.5 % (ref 11.5–15.5)
WBC: 15 K/uL — ABNORMAL HIGH (ref 4.0–10.5)
nRBC: 0 % (ref 0.0–0.2)

## 2023-10-05 LAB — BASIC METABOLIC PANEL WITH GFR
Anion gap: 13 (ref 5–15)
BUN: 16 mg/dL (ref 8–23)
CO2: 24 mmol/L (ref 22–32)
Calcium: 8.1 mg/dL — ABNORMAL LOW (ref 8.9–10.3)
Chloride: 92 mmol/L — ABNORMAL LOW (ref 98–111)
Creatinine, Ser: 1.08 mg/dL — ABNORMAL HIGH (ref 0.44–1.00)
GFR, Estimated: 46 mL/min — ABNORMAL LOW (ref >60)
Glucose, Bld: 112 mg/dL — ABNORMAL HIGH (ref 70–99)
Potassium: 3.2 mmol/L — ABNORMAL LOW (ref 3.5–5.1)
Sodium: 129 mmol/L — ABNORMAL LOW (ref 135–145)

## 2023-10-05 LAB — OSMOLALITY, URINE: Osmolality, Ur: 467 mosm/kg (ref 300–900)

## 2023-10-05 LAB — SODIUM, URINE, RANDOM: Sodium, Ur: 57 mmol/L

## 2023-10-05 LAB — GLUCOSE, CAPILLARY
Glucose-Capillary: 186 mg/dL — ABNORMAL HIGH (ref 70–99)
Glucose-Capillary: 127 mg/dL — ABNORMAL HIGH (ref 70–99)
Glucose-Capillary: 142 mg/dL — ABNORMAL HIGH (ref 70–99)

## 2023-10-05 LAB — MAGNESIUM: Magnesium: 2.3 mg/dL (ref 1.7–2.4)

## 2023-10-05 LAB — PHOSPHORUS: Phosphorus: 2.9 mg/dL (ref 2.5–4.6)

## 2023-10-05 MED ORDER — PANTOPRAZOLE SODIUM 20 MG PO TBEC
20.0000 mg | DELAYED_RELEASE_TABLET | Freq: Every day | ORAL | Status: DC
  Administered 2023-10-05 – 2023-10-06 (×2): 20 mg via ORAL
  Filled 2023-10-05 (×2): qty 1

## 2023-10-05 MED ORDER — ACETAMINOPHEN 650 MG RE SUPP: 650.0000 mg | Freq: Four times a day (QID) | RECTAL | Status: DC | PRN

## 2023-10-05 MED ORDER — POTASSIUM CHLORIDE CRYS ER 20 MEQ PO TBCR
40.0000 meq | EXTENDED_RELEASE_TABLET | Freq: Once | ORAL | Status: AC
  Administered 2023-10-05: 40 meq via ORAL
  Filled 2023-10-05: qty 2

## 2023-10-05 MED ORDER — INSULIN ASPART 100 UNIT/ML IJ SOLN
0.0000 [IU] | Freq: Three times a day (TID) | INTRAMUSCULAR | Status: DC
  Administered 2023-10-05 (×2): 2 [IU] via SUBCUTANEOUS
  Filled 2023-10-05: qty 0.15

## 2023-10-05 MED ORDER — CLOPIDOGREL BISULFATE 75 MG PO TABS
75.0000 mg | ORAL_TABLET | Freq: Every day | ORAL | Status: DC
  Administered 2023-10-05 – 2023-10-06 (×2): 75 mg via ORAL
  Filled 2023-10-05 (×2): qty 1

## 2023-10-05 MED ORDER — SODIUM CHLORIDE 0.9 % IV SOLN
1.0000 g | INTRAVENOUS | Status: DC
  Administered 2023-10-05: 1 g via INTRAVENOUS
  Filled 2023-10-05: qty 10

## 2023-10-05 MED ORDER — ENOXAPARIN SODIUM 40 MG/0.4ML IJ SOSY
40.0000 mg | PREFILLED_SYRINGE | INTRAMUSCULAR | Status: DC
  Administered 2023-10-05: 40 mg via SUBCUTANEOUS
  Filled 2023-10-05: qty 0.4

## 2023-10-05 MED ORDER — ONDANSETRON HCL 4 MG/2ML IJ SOLN: 4.0000 mg | Freq: Four times a day (QID) | INTRAMUSCULAR | Status: DC | PRN

## 2023-10-05 MED ORDER — ATORVASTATIN CALCIUM 20 MG PO TABS
20.0000 mg | ORAL_TABLET | ORAL | Status: DC
  Administered 2023-10-05: 20 mg via ORAL
  Filled 2023-10-05: qty 1

## 2023-10-05 MED ORDER — ONDANSETRON HCL 4 MG PO TABS: 4.0000 mg | ORAL_TABLET | Freq: Four times a day (QID) | ORAL | Status: DC | PRN

## 2023-10-05 MED ORDER — SENNOSIDES-DOCUSATE SODIUM 8.6-50 MG PO TABS: 1.0000 | ORAL_TABLET | Freq: Every evening | ORAL | Status: DC | PRN

## 2023-10-05 MED ORDER — ENOXAPARIN SODIUM 30 MG/0.3ML IJ SOSY
30.0000 mg | PREFILLED_SYRINGE | INTRAMUSCULAR | Status: DC
  Administered 2023-10-06: 30 mg via SUBCUTANEOUS
  Filled 2023-10-05 (×2): qty 0.3

## 2023-10-05 MED ORDER — ACETAMINOPHEN 325 MG PO TABS: 650.0000 mg | ORAL_TABLET | Freq: Four times a day (QID) | ORAL | Status: DC | PRN

## 2023-10-05 MED ORDER — ENSURE PLUS HIGH PROTEIN PO LIQD
237.0000 mL | Freq: Two times a day (BID) | ORAL | Status: DC
  Administered 2023-10-05 – 2023-10-06 (×3): 237 mL via ORAL

## 2023-10-05 MED ORDER — BISACODYL 5 MG PO TBEC: 5.0000 mg | DELAYED_RELEASE_TABLET | Freq: Every day | ORAL | Status: DC | PRN

## 2023-10-05 NOTE — TOC Initial Note (Signed)
 Transition of Care [facility]) - Initial/Assessment Note    Patient Details  Name: [name] MRN: [id number] Date of Birth: 1922-11-20  Transition of Care [facility]) CM/SW Contact:    Heather DELENA Saltness, LCSW Phone Number: 10/05/2023, 4:01 PM  Clinical Narrative:                 [facility] from [facility]. Awaiting PT/OT evaluation. TOC will continue to follow.    Expected Discharge Plan: Home/Self Care Barriers to Discharge: Continued Medical Work up   Patient Goals and CMS Choice Patient states their goals for this hospitalization and ongoing recovery are:: To return to [facility]        Expected Discharge Plan and Services In-house Referral: Clinical Social Work Discharge Planning Services: NA Post Acute Care Choice: NA Living arrangements for the past 2 months: Independent Living Facility                 DME Arranged: N/A DME Agency: NA       HH Arranged: NA HH Agency: NA        Prior Living Arrangements/Services Living arrangements for the past 2 months: Independent Living Facility Lives with:: Self Patient language and need for interpreter reviewed:: Yes Do you feel safe going back to the place where you live?: Yes      Need for Family Participation in Patient Care: No (Comment) Care giver support system in place?: No (comment)   Criminal Activity/Legal Involvement Pertinent to Current Situation/Hospitalization: No - Comment as needed  Activities of Daily Living   ADL Screening (condition at time of admission) Independently performs ADLs?: Yes (appropriate for developmental age) Is the patient deaf or have difficulty hearing?: Yes Does the patient have difficulty seeing, even when wearing glasses/contacts?: No Does the patient have difficulty concentrating, remembering, or making decisions?: No  Permission Sought/Granted   Permission granted to share information with : No    Emotional Assessment Appearance:: Appears stated  age, Well-Groomed Attitude/Demeanor/Rapport: Unable to Assess Affect (typically observed): Unable to Assess Orientation: : Oriented to Self, Oriented to Place, Oriented to  Time Alcohol / Substance Use: Not Applicable Psych Involvement: No (comment)  Admission diagnosis:  Sepsis (HCC) [A41.9] Sepsis without acute organ dysfunction, due to unspecified organism [facility]) [A41.9] Patient Active Problem List   Diagnosis Date Noted   Acute pyelonephritis 10/05/2023   Acute metabolic encephalopathy 10/05/2023   Hypokalemia 10/05/2023   Hyponatremia 10/05/2023   Sepsis secondary to UTI (HCC) 10/04/2023   Exudative age-related macular degeneration of right eye, unspecified stage (HCC) 08/10/2019   Low back pain without sciatica 08/04/2018   CKD (chronic kidney disease), stage III (HCC) 07/29/2017   Hyperlipidemia associated with type 2 diabetes mellitus (HCC) 06/28/2014   Type 2 diabetes mellitus with diabetic chronic kidney disease (HCC) 06/28/2014   ACP (advance care planning) 12/17/2012   History of CVA in adulthood    Routine health maintenance 08/25/2011   Essential hypertension 12/02/2006   GERD 12/02/2006   PCP:  Katrinka Garnette KIDD, MD Pharmacy:   [facility] [place], [place] - [address] [facility] [address] [place] [address] Phone: [phone] Fax: [phone]   Social Drivers of Health (SDOH) Social History: SDOH Screenings   Food Insecurity: No Food Insecurity (10/05/2023)  Housing: Low Risk  (10/05/2023)  Transportation Needs: No Transportation Needs (10/05/2023)  Utilities: Not At Risk (10/05/2023)  Alcohol Screen: Low Risk  (07/22/2023)  Depression (PHQ2-9): Low  Risk  (07/22/2023)  Financial Resource Strain: Low Risk  (07/22/2023)  Physical Activity: Inactive (07/22/2023)  Social Connections: Moderately Integrated (10/05/2023)  Stress: No Stress Concern Present (07/22/2023)  Tobacco Use: Medium Risk (10/05/2023)  Health Literacy:  Adequate Health Literacy (07/22/2023)   SDOH Interventions: None indicated     Readmission Risk Interventions     No data to display          Heather Saltness, MSW, LCSW Clinical Social Worker Inpatient Care Management 10/05/2023 4:02 PM

## 2023-10-05 NOTE — Evaluation (Signed)
 Occupational Therapy Evaluation Patient Details Name: [name] MRN: [id number] DOB: 11/03/1922 Today's Date: 10/05/2023   History of Present Illness   Pt is a 89 y.o. female who presents to the ED for evaluation of increased confusion, generalized weakness and urinary frequency and admitted 10/04/23 for sepsis and pyelonephritis. Past medical history significant for CVA, HTN, T2DM, arthritis, cervical cancer, GERD, venous stasis dermatitis, macular degeneration, and PUD     Clinical Impressions The pt is currently presenting slightly below her baseline level of functioning for self-care management. During the session, she required SBA to CGA for tasks, including supine to sit, sit to stand, toileting at bathroom level, and handwashing in standing at the sink. She reported having chronic R hip discomfort, secondary to arthritis. She will benefit from further OT services in the acute care setting to maximize her independence with ADLs and to facilitate her safe return to her ILF. OT anticipates no post-acute care therapy needs.      If plan is discharge home, recommend the following:   Assistance with cooking/housework;Assist for transportation     Functional Status Assessment   Patient has had a recent decline in their functional status and demonstrates the ability to make significant improvements in function in a reasonable and predictable amount of time.     Equipment Recommendations   None recommended by OT     Recommendations for Other Services         Precautions/Restrictions         Mobility Bed Mobility Overal bed mobility: Needs Assistance Bed Mobility: Supine to Sit, Sit to Supine     Supine to sit: HOB elevated, Supervision Sit to supine: Supervision        Transfers Overall transfer level: Needs assistance Equipment used: Rolling walker (2 wheels) Transfers: Sit to/from Stand Sit to Stand: Contact guard assist                   Balance Overall balance assessment: Mild deficits observed, not formally tested          ADL either performed or assessed with clinical judgement   ADL Overall ADL's : Needs assistance/impaired Eating/Feeding: Independent;Sitting   Grooming: Contact guard assist;Standing Grooming Details (indicate cue type and reason): she performed hand washing at sink level         Upper Body Dressing : Set up;Sitting     Lower Body Dressing Details (indicate cue type and reason): She was unable to donn/doff socks seated EOB. She stated she does not wear socks at her baseline, rather wears slide in shoes. Toilet Transfer: Contact guard assist;Rolling walker (2 wheels);Ambulation;Grab bars Toilet Transfer Details (indicate cue type and reason): She performed at bathroom level. Toileting- Clothing Manipulation and Hygiene: Contact guard assist;Sit to/from stand Toileting - Clothing Manipulation Details (indicate cue type and reason): She performed toileting tasks at bathroom level.             Vision Baseline Vision/History: 1 Wears glasses              Pertinent Vitals/Pain Pain Assessment Pain Assessment: No/denies pain     Extremity/Trunk Assessment Upper Extremity Assessment Upper Extremity Assessment: Right hand dominant;RUE deficits/detail;LUE deficits/detail RUE Deficits / Details: AROM WFL. Functional grip strength LUE Deficits / Details: AROM WFL. Functional grip strength   Lower Extremity Assessment Lower Extremity Assessment: Overall WFL for tasks assessed       Communication Communication Factors Affecting Communication: Hearing impaired   Cognition Arousal: Alert Behavior During Therapy: [facility]  for tasks assessed/performed Cognition: No apparent impairments             OT - Cognition Comments: Oriented x4                 Following commands: Intact                  Home Living Family/patient expects to be discharged to:: Other (Comment)  ([facility])     Type of Home: Independent living facility             Bathroom Shower/Tub: Walk-in shower         Home Equipment: Rollator (4 wheels);Shower seat - built in          Prior Functioning/Environment Prior Level of Function : Independent/Modified Independent             Mobility Comments:  (She used a rollator for ambulation.) ADLs Comments: She was modified independent to independent with ADLs, she did light cleaning, and meals were provided by ILF.    OT Problem List: Decreased strength;Impaired balance (sitting and/or standing);Decreased knowledge of use of DME or AE   OT Treatment/Interventions: Self-care/ADL training;Therapeutic exercise;Therapeutic activities;Energy conservation;Patient/family education;DME and/or AE instruction;Balance training      OT Goals(Current goals can be found in the care plan section)   Acute Rehab OT Goals OT Goal Formulation: With patient Time For Goal Achievement: 10/19/23 Potential to Achieve Goals: Good ADL Goals Pt Will Perform Grooming: with modified independence;standing Pt Will Perform Lower Body Dressing: with modified independence;sitting/lateral leans;sit to/from stand Pt Will Transfer to Toilet: with modified independence;ambulating Pt Will Perform Toileting - Clothing Manipulation and hygiene: with modified independence;sit to/from stand   OT Frequency:  Min 2X/week       AM-PAC OT 6 Clicks Daily Activity     Outcome Measure Help from another person eating meals?: None Help from another person taking care of personal grooming?: A Little Help from another person toileting, which includes using toliet, bedpan, or urinal?: A Little Help from another person bathing (including washing, rinsing, drying)?: A Little Help from another person to put on and taking off regular upper body clothing?: None Help from another person to put on and taking off regular lower body clothing?: A Little 6 Click  Score: 20   End of Session Equipment Utilized During Treatment: Rolling walker (2 wheels) Nurse Communication: Mobility status  Activity Tolerance: Patient tolerated treatment well Patient left: in bed;with call bell/phone within reach;with bed alarm set  OT Visit Diagnosis: Muscle weakness (generalized) (M62.81);Unsteadiness on feet (R26.81)                Time: 8484-8470 OT Time Calculation (min): 14 min Charges:  OT General Charges $OT Visit: 1 Visit OT Evaluation $OT Eval Low Complexity: 1 Low    Harding Thomure L Gabrille Kilbride, OTR/L 10/05/2023, 5:22 PM

## 2023-10-05 NOTE — Progress Notes (Signed)
 Progress Note   Patient: [name] FMW:[id number] DOB: 01/21/1923 DOA: 10/04/2023     1 DOS: the patient was seen and examined on 10/05/2023   Brief hospital course: 89yo with h/o CVA, HTN, DM, and stasis dermatitis who presented on 7/26 with AMS and urinary symptoms.  Temp 101.4, UA suggestive of UTI, CT A/P with pyelonephritis.  She was started on Ceftriaxone  for sepsis due to UTI.  Assessment and Plan:  Sepsis due to Pyelonephritis - Presented with increased confusion, generalized weakness, nausea and urinary frequency - UA shows negative nitrite and leuks but many bacteria and WBC 6-10 - CT A/P shows findings concerning for pyelonephritis - Met sepsis criteria with fever, leukocytosis and evidence of pyelonephritis - Continue IV Rocephin  - Follow-up blood and urine cultures   Hyponatremia - Sodium of 129 on admission from normal baseline - Pt lethargic with slight word finding difficulty but fully oriented - Likely secondary to decreased fluid intake in the setting of hydrochlorothiazide  use - Continue IV NS infusion as above - Check urine sodium and urine osm - F/u repeat BMP   Hypokalemia - K+ low at 2.6 on admission, repleted with KCl 80 mEq in the ED - F/u with AM BMP   Acute metabolic encephalopathy - Reported increased confusion and not feeling like herself at time of presentation - On initial neuro exam, patient oriented x 4 but with lethargy, mild delayed thought process and word finding difficulty (unclear if this is different from her baseline) - CT head with no acute intracranial abnormalities - AMS likely 2/2 combination of urinary infection and hyponatremia - Delirium precautions - She is significantly improved today and close to back to baseline   Severe asymptomatic hypertension - BP elevated with SBP in the 160s to 190s - Patient reports she had not taken her BP meds PTA - Continue diltiazem  - Hold HCTZ in the setting of hyponatremia - BP is currently  109/64, markedly improved   T2DM - A1c of 6.9% 2 weeks ago - Diet controlled, which is appropriate as her goal A1c is <8 given age - Will stop SSI, as this is unlikely to lead to significant improvement in morbidity/mortality   Hx of CVA/HLD - Continue Plavix  and atorvastatin    GERD - Continue Protonix   Stage 3a CKD - Appears to be stable at this time - Attempt to avoid nephrotoxic medications - Recheck BMP in AM    Generalized weakness - In the setting of acute illness - Start protein supplementation - PT/OT consulted and she is not recommended for home therapy - Fall precautions - She lives at [facility] in ILF and is expected to return to the same level of care, possibly as early as 7/28       Consultants: PT OT  Procedures: None  Antibiotics: Ceftriaxone  7/26- Metronidazole  x 1 Vancomycin  x 1  30 Day Unplanned Readmission Risk Score    Flowsheet Row ED to Hosp-Admission (Current) from 10/04/2023 in [place] [facility]  30 Day Unplanned Readmission Risk Score (%) 12.11 Filed at 10/05/2023 0401    This score is the patient's risk of an unplanned readmission within 30 days of being discharged (0 -100%). The score is based on dignosis, age, lab data, medications, orders, and past utilization.   Low:  0-14.9   Medium: 15-21.9   High: 22-29.9   Extreme: 30 and above           Subjective: She reports that she was confused yesterday but is  feeling some better today.  No clear urinary symptoms PTA.  No respiratory symptoms but O2 sat dropped to 86% with removal of [place] O2.  I spoke with her niece - the patient is concerned that she needs to work tomorrow, needs to get back to her card game.   Objective: Vitals:   10/05/23 0520 10/05/23 0903  BP: (!) 156/80 109/64  Pulse: 69 68  Resp: 16 19  Temp: 98.5 F (36.9 C) 99.1 F (37.3 C)  SpO2: 98% 100%    Intake/Output Summary (Last 24 hours) at 10/05/2023 1307 Last data filed at 10/05/2023  0647 Gross per 24 hour  Intake 1120.26 ml  Output --  Net 1120.26 ml   Filed Weights   10/04/23 2330  Weight: 76 kg    Exam:  General:  Appears calm and comfortable and is in NAD Eyes:  normal lids, iris ENT:  grossly normal hearing, lips & tongue, mmm Cardiovascular:  RRR. No LE edema.  Respiratory:   CTA bilaterally with no wheezes/rales/rhonchi.  Normal respiratory effort. Abdomen:  soft, NT, ND Skin:  no rash or induration seen on limited exam Musculoskeletal:  grossly normal tone BUE/BLE, good ROM, no bony abnormality Psychiatric:  grossly normal mood and affect, speech fluent and appropriate, AOx3 Neurologic:  CN 2-12 grossly intact, moves all extremities in coordinated fashion  Data Reviewed: I have reviewed the patient's lab results since admission.  Pertinent labs for today include:  Na++ 129, baseline 139 K+ 3.2 Glucose 112 BUN 16/Creatinine 1.08/GFR 46, stable Lactic acid 1.6 WBC 15, down from 17 UA: small Hgb, 100 protein, many bacteria Blood and urine cultures pending    Family Communication: None present; I spoke with her niece by telephone (she was at the bedside at the time)  Disposition: Status is: Inpatient Remains inpatient appropriate because: ongoing monitoring     Time spent: 50 minutes  Unresulted Labs (From admission, onward)     Start     Ordered   10/06/23 0500  Basic metabolic panel with GFR  Tomorrow morning,   R        10/05/23 1307   10/06/23 0500  CBC with Differential/Platelet  Tomorrow morning,   R        10/05/23 1307   10/04/23 2353  Urine Culture (for pregnant, neutropenic or urologic patients or patients with an indwelling urinary catheter)  (Urine Labs)  Once,   R       Question:  Indication  Answer:  Sepsis   10/04/23 2353             Author: Delon Herald, MD 10/05/2023 1:07 PM  For on call review [email or web address].

## 2023-10-05 NOTE — Hospital Course (Addendum)
 89yo with h/o CVA, HTN, DM, and stasis dermatitis who presented on 7/26 with AMS and urinary symptoms.  Temp 101.4, UA suggestive of UTI, CT A/P with pyelonephritis.  She was started on Ceftriaxone  for sepsis due to UTI.

## 2023-10-05 NOTE — Evaluation (Signed)
 Physical Therapy Evaluation Patient Details Name: [name] MRN: [id number] DOB: 1922/07/31 Today's Date: 10/05/2023  History of Present Illness  Pt is a 89 y.o. female who presents to the ED for evaluation of increased confusion, generalized weakness and urinary frequency and admitted 10/04/23 for sepsis and pyelonephritis. Past medical history significant for CVA, HTN, T2DM, arthritis, cervical cancer, GERD, venous stasis dermatitis, macular degeneration, and PUD  Clinical Impression  Pt admitted with above diagnosis.  Pt currently with functional limitations due to the deficits listed below (see PT Problem List). Pt will benefit from acute skilled PT to increase their independence and safety with mobility to allow discharge.  Pt reports she lives in independent living at [facility] and is typically modified independent with use of RW.  Pt walks to dining hall and likes to frequently participate in bridge.  Pt feels she can likely return to her apartment at d/c.  She declines f/u PT stating she has tried in the past but it aggravates her right hip pain more now, so she would rather not partake (needs THA however no longer surgical candidate per her report).  Pt is very HOH but appears cognition is appropriate especially for age (when she responds to questions she can hear).         If plan is discharge home, recommend the following:     Can travel by private vehicle        Equipment Recommendations None recommended by PT  Recommendations for Other Services       Functional Status Assessment Patient has had a recent decline in their functional status and demonstrates the ability to make significant improvements in function in a reasonable and predictable amount of time.     Precautions / Restrictions Precautions Precautions: Fall      Mobility  Bed Mobility Overal bed mobility: Needs Assistance Bed Mobility: Supine to Sit     Supine to sit: HOB elevated, Supervision      General bed mobility comments: increased time and effort however no physical assist required    Transfers Overall transfer level: Needs assistance Equipment used: Rolling walker (2 wheels) Transfers: Sit to/from Stand Sit to Stand: Contact guard assist           General transfer comment: effortful but able to perform without assist    Ambulation/Gait Ambulation/Gait assistance: Contact guard assist Gait Distance (Feet): 60 Feet (x2) Assistive device: Rolling walker (2 wheels) Gait Pattern/deviations: Step-through pattern, Decreased stride length, Antalgic, Decreased stance time - right Gait velocity: decr     General Gait Details: antalgic gait observed due to chronic right hip pain; distance to pt tolerance; required one standing rest break (60 ft x2)  Stairs            Wheelchair Mobility     Tilt Bed    Modified Rankin (Stroke Patients Only)       Balance Overall balance assessment: Mild deficits observed, not formally tested (reports no recent falls, reliant on RW)                                           Pertinent Vitals/Pain Pain Assessment Pain Assessment: Faces Faces Pain Scale: Hurts little more Pain Location: chronic right hip Pain Descriptors / Indicators: Tender, Aching Pain Intervention(s): Repositioned, Monitored during session    Home Living Family/patient expects to be discharged to:: Other (Comment) ([facility] IL[facility])     Type of Home: Independent living facility           Home Equipment: Agricultural consultant (2 wheels)      Prior Function Prior Level of Function : Independent/Modified Independent             Mobility Comments: reports ambulatory with RW, walks to dining hall and plays bridge often       Extremity/Trunk Assessment        Lower Extremity Assessment Lower Extremity Assessment: Generalized weakness;RLE deficits/detail;LLE deficits/detail RLE Deficits / Details: reports  right hip OA, needs THA but no longer a surgical candidate due to age I waited too long, so now I just live with it (pain) LLE Deficits / Details: observed lower leg more edematous then right       Communication   Communication Communication: Impaired Factors Affecting Communication: Hearing impaired    Cognition Arousal: Alert Behavior During Therapy: WFL for tasks assessed/performed   PT - Cognitive impairments: Difficult to assess Difficult to assess due to: Hard of hearing/deaf                     PT - Cognition Comments: very HOH, answers most questions appropriately when able to hear Following commands: Intact       Cueing Cueing Techniques: Verbal cues, Visual cues     General Comments      Exercises     Assessment/Plan    PT Assessment Patient needs continued PT services  PT Problem List Decreased mobility;Decreased balance;Decreased activity tolerance;Decreased strength       PT Treatment Interventions DME instruction;Gait training;Balance training;Functional mobility training;Therapeutic activities;Therapeutic exercise;Patient/family education    PT Goals (Current goals can be found in the Care Plan section)  Acute Rehab PT Goals PT Goal Formulation: With patient Time For Goal Achievement: 10/19/23 Potential to Achieve Goals: Good    Frequency Min 2X/week     Co-evaluation               AM-PAC PT 6 Clicks Mobility  Outcome Measure Help needed turning from your back to your side while in a flat bed without using bedrails?: A Little Help needed moving from lying on your back to sitting on the side of a flat bed without using bedrails?: A Little Help needed moving to and from a bed to a chair (including a wheelchair)?: A Little Help needed standing up from a chair using your arms (e.g., wheelchair or bedside chair)?: A Little Help needed to walk in hospital room?: A Little Help needed climbing 3-5 steps with a railing? : A Little 6  Click Score: 18    End of Session Equipment Utilized During Treatment: Gait belt Activity Tolerance: Patient tolerated treatment well Patient left: in chair;with call bell/phone within reach;with nursing/sitter in room Nurse Communication: Mobility status PT Visit Diagnosis: Difficulty in walking, not elsewhere classified (R26.2);Muscle weakness (generalized) (M62.81)    Time: 8871-8856 PT Time Calculation (min) (ACUTE ONLY): 15 min   Charges:   PT Evaluation $PT Eval Low Complexity: 1 Low   PT General Charges $$ ACUTE PT VISIT: 1 Visit        Tari PT, DPT Physical Therapist Acute Rehabilitation Services Office: [phone]   Tari CROME Payson 10/05/2023, 12:54 PM

## 2023-10-05 NOTE — Plan of Care (Signed)
  Problem: Clinical Measurements: Goal: Diagnostic test results will improve Outcome: Progressing   Problem: Pain Managment: Goal: General experience of comfort will improve and/or be controlled Outcome: Progressing   Problem: Safety: Goal: Ability to remain free from injury will improve Outcome: Progressing

## 2023-10-05 NOTE — ED Notes (Signed)
 Pt placed on bedpan to urinate for sample. Minimal sample provided. Sent to lab for possible analysis if sample sufficient.

## 2023-10-06 LAB — CBC WITH DIFFERENTIAL/PLATELET
Abs Immature Granulocytes: 0.11 K/uL — ABNORMAL HIGH (ref 0.00–0.07)
Basophils Absolute: 0.1 K/uL (ref 0.0–0.1)
Basophils Relative: 0 %
Eosinophils Absolute: 0 K/uL (ref 0.0–0.5)
Eosinophils Relative: 0 %
HCT: 38.5 % (ref 36.0–46.0)
Hemoglobin: 12.9 g/dL (ref 12.0–15.0)
Immature Granulocytes: 1 %
Lymphocytes Relative: 13 %
Lymphs Abs: 2 K/uL (ref 0.7–4.0)
MCH: 29.9 pg (ref 26.0–34.0)
MCHC: 33.5 g/dL (ref 30.0–36.0)
MCV: 89.1 fL (ref 80.0–100.0)
Monocytes Absolute: 1.7 K/uL — ABNORMAL HIGH (ref 0.1–1.0)
Monocytes Relative: 12 %
Neutro Abs: 10.7 K/uL — ABNORMAL HIGH (ref 1.7–7.7)
Neutrophils Relative %: 74 %
Platelets: 285 K/uL (ref 150–400)
RBC: 4.32 MIL/uL (ref 3.87–5.11)
RDW: 12.5 % (ref 11.5–15.5)
WBC: 14.5 K/uL — ABNORMAL HIGH (ref 4.0–10.5)
nRBC: 0 % (ref 0.0–0.2)

## 2023-10-06 LAB — BASIC METABOLIC PANEL WITH GFR
Anion gap: 10 (ref 5–15)
BUN: 17 mg/dL (ref 8–23)
CO2: 25 mmol/L (ref 22–32)
Calcium: 8.5 mg/dL — ABNORMAL LOW (ref 8.9–10.3)
Chloride: 98 mmol/L (ref 98–111)
Creatinine, Ser: 0.82 mg/dL (ref 0.44–1.00)
GFR, Estimated: >60 mL/min (ref >60)
Glucose, Bld: 115 mg/dL — ABNORMAL HIGH (ref 70–99)
Potassium: 2.9 mmol/L — ABNORMAL LOW (ref 3.5–5.1)
Sodium: 133 mmol/L — ABNORMAL LOW (ref 135–145)

## 2023-10-06 MED ORDER — POTASSIUM CHLORIDE CRYS ER 20 MEQ PO TBCR
40.0000 meq | EXTENDED_RELEASE_TABLET | ORAL | Status: AC
  Administered 2023-10-06 (×2): 40 meq via ORAL
  Filled 2023-10-06 (×2): qty 2

## 2023-10-06 MED ORDER — POTASSIUM CHLORIDE CRYS ER 20 MEQ PO TBCR: 20.0000 meq | EXTENDED_RELEASE_TABLET | Freq: Two times a day (BID) | ORAL | 0 refills | Status: DC

## 2023-10-06 MED ORDER — CEFDINIR 300 MG PO CAPS: 300.0000 mg | ORAL_CAPSULE | Freq: Two times a day (BID) | ORAL | 0 refills | Status: DC

## 2023-10-06 MED ORDER — CEFDINIR 300 MG PO CAPS: 300.0000 mg | ORAL_CAPSULE | Freq: Two times a day (BID) | ORAL | 0 refills | Status: AC

## 2023-10-06 MED ORDER — ENOXAPARIN SODIUM 40 MG/0.4ML IJ SOSY: 40.0000 mg | PREFILLED_SYRINGE | INTRAMUSCULAR | Status: DC

## 2023-10-06 NOTE — Discharge Summary (Signed)
 Physician Discharge Summary   Patient: [name] MRN: [id number] DOB: 02-05-1923  Admit date:     10/04/2023  Discharge date: 10/06/23  Discharge Physician: MARGARETMARY DELENA HALEY   PCP: Katrinka Garnette KIDD, MD   Recommendations at discharge:   Patient will need outpatient follow-up with primary care physician within 2 to 3 days to follow-up on the results of the sensitivities or urine culture.  Her urine culture is growing E. coli, however sensitivities are pending at this time. Patient's potassium level will need to be checked in the next 2 to 3 days as her potassium was low.  Discharge Diagnoses: Principal Problem:   Sepsis secondary to UTI [facility]) Active Problems:   Acute pyelonephritis   Acute metabolic encephalopathy   Hypokalemia   Hyponatremia   Hospital Course: 89yo with h/o CVA, HTN, DM, and stasis dermatitis who presented on 7/26 with AMS and urinary symptoms.  Temp 101.4, UA suggestive of UTI, CT A/P with pyelonephritis.  She was started on Ceftriaxone  for sepsis due to UTI.  Please refer to the history and physical for details.  Briefly patient is a 89 year old female with history of CVA, HTN, cervical cancer, type II DM, venous stasis dermatitis, peptic ulcer disease who is a resident of independent living, presented for symptoms of generalized weakness, increased urinary frequency, some increased confusion.  She also reported decreased oral intake, not feeling well, mild nausea.  In the ER, she had a fever of 101, he was otherwise hemodynamically stable.  Potassium was low at 2.6.  Urinalysis showed small amount of hemoglobinuria, negative nitrate, negative leukocyte esterase, WBC 6-10 and many bacteria. Chest x-ray without any acute abnormalities.  CT head negative.  CT abdomen and pelvis showed findings worrisome for pyelonephritis.  #1.  Sepsis due to pyelonephritis/E. coli UTI. Patient was hospitalized, treated with IV Rocephin .  Her fever has resolved, she has remained  hemodynamically stable.  Overall reports feeling better.  Urine culture with E. coli.  Sensitivities are pending at this time.  Will plan to discharge patient on oral Omnicef  with close follow-up with PCP.  Patient's urine culture sensitivities for E. coli bacteria would need to be followed up by PCP as well.  #2.  Hypokalemia/mild hyponatremia.  Serum sodium appears to be better.  Serum potassium is still somewhat low.  Will prescribe oral potassium supplement for discharge, will need to repeat labs to monitor.  #3.  Acute metabolic encephalopathy.  Appears to have improved, patient is back to baseline.  CT head without any acute abnormalities.  #4.  Essential hypertension.  Appears to be relatively stable.  5.  Type II DM.  Appears to be diet controlled.  #6.  CKD 3A.  Renal function is stable at baseline.  #7.  Generalized weakness.  Patient seen by PT/OT in consultation, she did not want to go to any rehab and wanted to return [facility].  Assessment and Plan: No notes have been filed under this hospital service. Service: Hospitalist    Consultants:  Procedures performed:   Disposition: independent living facility Diet recommendation:  Discharge Diet Orders (From admission, onward)     Start     Ordered   10/06/23 0000  Diet - low sodium heart healthy        10/06/23 1305           Cardiac and Carb modified diet DISCHARGE MEDICATION: Allergies as of 10/06/2023   No Known Allergies  Medication List     PAUSE taking these medications    naproxen sodium 220 MG tablet Wait to take this until: October 13, 2023 Commonly known as: ALEVE Take 220 mg by mouth daily.       TAKE these medications    atorvastatin  20 MG tablet Commonly known as: LIPITOR Take 1 tablet (20 mg total) by mouth once a week.   cefdinir  300 MG capsule Commonly known as: OMNICEF  Take 1 capsule (300 mg total) by mouth 2 (two) times daily for 12 days.    Centrum Silver  tablet Take 1 tablet by mouth daily.   clopidogrel  75 MG tablet Commonly known as: PLAVIX  Take 1 tablet (75 mg total) by mouth daily. TAKE ONE TABLET BY MOUTH DAILY   diltiazem  240 MG 24 hr capsule Commonly known as: CARDIZEM  CD Take 1 capsule (240 mg total) by mouth daily.   DULCOLAX PO Take 1 tablet by mouth as needed.   hydrochlorothiazide  25 MG tablet Commonly known as: HYDRODIURIL  Take 1 tablet (25 mg total) by mouth daily.   pantoprazole  20 MG tablet Commonly known as: PROTONIX  Take 1 tablet (20 mg total) by mouth daily.        Discharge Exam: Filed Weights   10/04/23 2330  Weight: 76 kg   Patient is awake alert and fully oriented.  Heart with regular rate and rhythm.  Abdomen is soft.  No significant flank tenderness.  Lungs clear.  Condition at discharge: fair  The results of significant diagnostics from this hospitalization (including imaging, microbiology, ancillary and laboratory) are listed below for reference.   Imaging Studies: CT ABDOMEN PELVIS W CONTRAST Result Date: 10/04/2023 CLINICAL DATA:  Sepsis EXAM: CT ABDOMEN AND PELVIS WITH CONTRAST TECHNIQUE: Multidetector CT imaging of the abdomen and pelvis was performed using the standard protocol following bolus administration of intravenous contrast. RADIATION DOSE REDUCTION: This exam was performed according to the departmental dose-optimization program which includes automated exposure control, adjustment of the mA and/or kV according to patient size and/or use of iterative reconstruction technique. CONTRAST:  OMNIPAQUE  IOHEXOL  300 MG/ML  SOLN COMPARISON:  None Available. FINDINGS: Lower chest: There is atelectasis in the left lung base. Hepatobiliary: The gallstones are likely present. There is no biliary ductal dilatation. The liver is within normal limits. Pancreas: There is a rounded 7 mm hypodense area in the body of the pancreas. The pancreas is otherwise within normal limits. No  ductal dilatation or acute inflammation. Spleen: Normal in size without focal abnormality. Adrenals/Urinary Tract: There is a 9.3 cm cyst in the right kidney. The right kidney otherwise appears within normal limits. There is duplicated left renal collecting system. There is some wall enhancement of the proximal left ureter and renal pelvis. There is mild decreased enhancement throughout the lower portion of the kidney. Superior portion of the kidney appears normal. There is no hydronephrosis. The bladder and adrenal glands are within normal limits. Stomach/Bowel: Stomach is within normal limits. Appendix appears normal. No evidence of bowel wall thickening, distention, or inflammatory changes. There is sigmoid colon diverticulosis. Vascular/Lymphatic: Aortic atherosclerosis. No enlarged abdominal or pelvic lymph nodes. Reproductive: Status post hysterectomy. No adnexal masses. Other: There is trace free fluid in the pelvis. Musculoskeletal: Degenerative changes affect the spine. There severe degenerative changes of the right hip. IMPRESSION: 1. Duplicated left renal collecting system. There is wall enhancement of the inferior proximal left ureter and renal pelvis worrisome for infection. There is mild decreased enhancement throughout the lower portion of the left kidney  worrisome for pyelonephritis. 2. Trace free fluid in the pelvis. 3. Cholelithiasis. 4. 7 mm hypodense area in the body of the pancreas. Recommend follow-up MRI in 6 months. 5. Sigmoid colon diverticulosis. 6. Aortic atherosclerosis. Aortic Atherosclerosis (ICD10-I70.0). Electronically Signed   By: Greig Pique M.D.   On: 10/04/2023 23:02   DG Chest Port 1 View Result Date: 10/04/2023 CLINICAL DATA:  Sepsis, confusion, painful urinary retention. Nausea. EXAM: PORTABLE CHEST 1 VIEW COMPARISON:  11/18/2011 FINDINGS: Atherosclerotic calcification of the aortic arch. The patient is rotated to the right on today's radiograph, reducing diagnostic  sensitivity and specificity. Mild rightward deviation of the upper trachea, possibly related to a left thyroid  goiter. The lungs appear clear. No blunting of the costophrenic angles. Upper normal heart size. Moderate degenerative glenohumeral arthropathy bilaterally. IMPRESSION: 1. No acute findings. 2. Mild rightward deviation of the upper trachea, possibly related to a left thyroid  goiter. 3. Moderate degenerative glenohumeral arthropathy bilaterally. 4. Aortic Atherosclerosis (ICD10-I70.0). Electronically Signed   By: Ryan Salvage M.D.   On: 10/04/2023 20:17   CT Head Wo Contrast Result Date: 10/04/2023 CLINICAL DATA:  Mental status change, unknown cause EXAM: CT HEAD WITHOUT CONTRAST TECHNIQUE: Contiguous axial images were obtained from the base of the skull through the vertex without intravenous contrast. RADIATION DOSE REDUCTION: This exam was performed according to the departmental dose-optimization program which includes automated exposure control, adjustment of the mA and/or kV according to patient size and/or use of iterative reconstruction technique. COMPARISON:  08/14/2021 FINDINGS: Brain: There is atrophy and chronic small vessel disease changes. Old left thalamic and right basal ganglia lacunar infarcts. No acute intracranial abnormality. Specifically, no hemorrhage, hydrocephalus, mass lesion, acute infarction, or significant intracranial injury. Vascular: No hyperdense vessel or unexpected calcification. Skull: No acute calvarial abnormality. Sinuses/Orbits: No acute findings Other: None IMPRESSION: Atrophy, chronic microvascular disease. No acute intracranial abnormality. Electronically Signed   By: Franky Crease M.D.   On: 10/04/2023 19:55    Microbiology: Results for orders placed or performed during the hospital encounter of 10/04/23  Resp panel by RT-PCR (RSV, Flu A&B, Covid) Anterior Nasal Swab     Status: None   Collection Time: 10/04/23  7:00 PM   Specimen: Anterior Nasal Swab   Result Value Ref Range Status   SARS Coronavirus 2 by RT PCR NEGATIVE NEGATIVE Final    Comment: (NOTE) SARS-CoV-2 target nucleic acids are NOT DETECTED.  The SARS-CoV-2 RNA is generally detectable in upper respiratory specimens during the acute phase of infection. The lowest concentration of SARS-CoV-2 viral copies this assay can detect is 138 copies/mL. A negative result does not preclude SARS-Cov-2 infection and should not be used as the sole basis for treatment or other patient management decisions. A negative result may occur with  improper specimen collection/handling, submission of specimen other than nasopharyngeal swab, presence of viral mutation(s) within the areas targeted by this assay, and inadequate number of viral copies(<138 copies/mL). A negative result must be combined with clinical observations, patient history, and epidemiological information. The expected result is Negative.  Fact Sheet for Patients:  BloggerCourse.com  Fact Sheet for Healthcare Providers:  SeriousBroker.it  This test is no t yet approved or cleared by the [place]  FDA and  has been authorized for detection and/or diagnosis of SARS-CoV-2 by FDA under an Emergency Use Authorization (EUA). This EUA will remain  in effect (meaning this test can be used) for the duration of the COVID-19 declaration under Section 564(b)(1) of the Act, 21 U.S.C.section 360bbb-3(b)(1), unless  the authorization is terminated  or revoked sooner.       Influenza A by PCR NEGATIVE NEGATIVE Final   Influenza B by PCR NEGATIVE NEGATIVE Final    Comment: (NOTE) The Xpert Xpress SARS-CoV-2/FLU/RSV plus assay is intended as an aid in the diagnosis of influenza from Nasopharyngeal swab specimens and should not be used as a sole basis for treatment. Nasal washings and aspirates are unacceptable for Xpert Xpress SARS-CoV-2/FLU/RSV testing.  Fact Sheet for  Patients: BloggerCourse.com  Fact Sheet for Healthcare Providers: SeriousBroker.it  This test is not yet approved or cleared by the [place]  FDA and has been authorized for detection and/or diagnosis of SARS-CoV-2 by FDA under an Emergency Use Authorization (EUA). This EUA will remain in effect (meaning this test can be used) for the duration of the COVID-19 declaration under Section 564(b)(1) of the Act, 21 U.S.C. section 360bbb-3(b)(1), unless the authorization is terminated or revoked.     Resp Syncytial Virus by PCR NEGATIVE NEGATIVE Final    Comment: (NOTE) Fact Sheet for Patients: BloggerCourse.com  Fact Sheet for Healthcare Providers: SeriousBroker.it  This test is not yet approved or cleared by the [place]  FDA and has been authorized for detection and/or diagnosis of SARS-CoV-2 by FDA under an Emergency Use Authorization (EUA). This EUA will remain in effect (meaning this test can be used) for the duration of the COVID-19 declaration under Section 564(b)(1) of the Act, 21 U.S.C. section 360bbb-3(b)(1), unless the authorization is terminated or revoked.  Performed at [facility], [address], [place], [place] [address]   Urine Culture (for pregnant, neutropenic or urologic patients or patients with an indwelling urinary catheter)     Status: Abnormal (Preliminary result)   Collection Time: 10/04/23  8:13 PM   Specimen: Urine, Clean Catch  Result Value Ref Range Status   Specimen Description   Final    URINE, CLEAN CATCH Performed at [facility], [address], [place], [place] [address]    Special Requests   Final    NONE Performed at [facility], [address], [place], [place] [address]    Culture (A)  Final    >=100,000 COLONIES/mL ESCHERICHIA COLI CULTURE REINCUBATED FOR BETTER  GROWTH SUSCEPTIBILITIES TO FOLLOW Performed at [facility], [address] [place], [place] [address]    Report Status PENDING  Incomplete  Culture, blood (single)     Status: None (Preliminary result)   Collection Time: 10/04/23  9:35 PM   Specimen: BLOOD RIGHT FOREARM  Result Value Ref Range Status   Specimen Description   Final    BLOOD RIGHT FOREARM Performed at [facility], [address], [place], [place] [address]    Special Requests   Final    BOTTLES DRAWN AEROBIC AND ANAEROBIC Blood Culture adequate volume Performed at [facility], [address], [place], [place] [address]    Culture   Final    NO GROWTH 2 DAYS Performed at [facility], [address], [place], [place] [address]    Report Status PENDING  Incomplete    Labs: CBC: Recent Labs  Lab 10/04/23 1900 10/05/23 0531 10/06/23 0542  WBC 17.0* 15.0* 14.5*  NEUTROABS  --   --  10.7*  HGB 13.6 13.1 12.9  HCT 39.1 38.9 38.5  MCV 86.5 89.0 89.1  PLT 251 242 285   Basic Metabolic Panel: Recent Labs  Lab 10/04/23 1900 10/04/23 2049 10/05/23 0531  10/06/23 0542  NA 129*  --  129* 133*  K 2.6*  --  3.2* 2.9*  CL 91*  --  92* 98  CO2 25  --  24 25  GLUCOSE 149*  --  112* 115*  BUN 19  --  16 17  CREATININE 0.92  --  1.08* 0.82  CALCIUM  8.7*  --  8.1* 8.5*  MG  --  2.0 2.3  --   PHOS  --   --  2.9  --    Liver Function Tests: Recent Labs  Lab 10/04/23 1900  AST 25  ALT 22  ALKPHOS 125  BILITOT 1.3*  PROT 7.6  ALBUMIN 3.4*   CBG: Recent Labs  Lab 10/05/23 0106 10/05/23 0814 10/05/23 1143  GLUCAP 186* 127* 142*    Discharge time spent: greater than 30 minutes.  Signed: Dewana Ammirati A Joetta Delprado, MD [facility] 10/06/2023

## 2023-10-06 NOTE — TOC Transition Note (Signed)
 Transition of Care [facility]) - Discharge Note   Patient Details  Name: [name] MRN: [id number] Date of Birth: 1922/12/23  Transition of Care [facility]) CM/SW Contact:  Toy LITTIE Agar, RN Phone Number:[phone]  10/06/2023, 2:03 PM   Clinical Narrative:    Patient with discharge orders. Patient is from [facility] independent living and will return. Transportation has been arranged per [facility] transportation. No other IP care management needs noted at this time.    Final next level of care: Home/Self Care Barriers to Discharge: No Barriers Identified   Patient Goals and CMS Choice Patient states their goals for this hospitalization and ongoing recovery are:: To return to her apartment at [facility]   Choice offered to / list presented to : NA (returning to independent living)      Discharge Placement                       Discharge Plan and Services Additional resources added to the After Visit Summary for   In-house Referral: Clinical Social Work Discharge Planning Services: NA Post Acute Care Choice: NA          DME Arranged: N/A DME Agency: NA       HH Arranged: NA HH Agency: NA        Social Drivers of Health (SDOH) Interventions SDOH Screenings   Food Insecurity: No Food Insecurity (10/05/2023)  Housing: Low Risk  (10/05/2023)  Transportation Needs: No Transportation Needs (10/05/2023)  Utilities: Not At Risk (10/05/2023)  Alcohol Screen: Low Risk  (07/22/2023)  Depression (PHQ2-9): Low Risk  (07/22/2023)  Financial Resource Strain: Low Risk  (07/22/2023)  Physical Activity: Inactive (07/22/2023)  Social Connections: Moderately Integrated (10/05/2023)  Stress: No Stress Concern Present (07/22/2023)  Tobacco Use: Medium Risk (10/05/2023)  Health Literacy: Adequate Health Literacy (07/22/2023)     Readmission Risk Interventions     No data to display

## 2023-10-06 NOTE — Plan of Care (Signed)
 Gave discharge instructions to patient, she verbalized her understanding. Going by transport provided by [facility]. Vitals stable, PIVS intact upon removal.    Problem: Education: Goal: Ability to describe self-care measures that may prevent or decrease complications (Diabetes Survival Skills Education) will improve Outcome: Adequate for Discharge Goal: Individualized Educational Video(s) Outcome: Adequate for Discharge   Problem: Coping: Goal: Ability to adjust to condition or change in health will improve Outcome: Adequate for Discharge   Problem: Fluid Volume: Goal: Ability to maintain a balanced intake and output will improve Outcome: Adequate for Discharge   Problem: Health Behavior/Discharge Planning: Goal: Ability to identify and utilize available resources and services will improve Outcome: Adequate for Discharge Goal: Ability to manage health-related needs will improve Outcome: Adequate for Discharge   Problem: Metabolic: Goal: Ability to maintain appropriate glucose levels will improve Outcome: Adequate for Discharge   Problem: Nutritional: Goal: Maintenance of adequate nutrition will improve Outcome: Adequate for Discharge Goal: Progress toward achieving an optimal weight will improve Outcome: Adequate for Discharge   Problem: Skin Integrity: Goal: Risk for impaired skin integrity will decrease Outcome: Adequate for Discharge   Problem: Tissue Perfusion: Goal: Adequacy of tissue perfusion will improve Outcome: Adequate for Discharge   Problem: Education: Goal: Knowledge of General Education information will improve Description: Including pain rating scale, medication(s)/side effects and non-pharmacologic comfort measures Outcome: Adequate for Discharge   Problem: Health Behavior/Discharge Planning: Goal: Ability to manage health-related needs will improve Outcome: Adequate for Discharge   Problem: Clinical Measurements: Goal: Ability to maintain  clinical measurements within normal limits will improve Outcome: Adequate for Discharge Goal: Will remain free from infection Outcome: Adequate for Discharge Goal: Diagnostic test results will improve Outcome: Adequate for Discharge Goal: Respiratory complications will improve Outcome: Adequate for Discharge Goal: Cardiovascular complication will be avoided Outcome: Adequate for Discharge   Problem: Activity: Goal: Risk for activity intolerance will decrease Outcome: Adequate for Discharge   Problem: Nutrition: Goal: Adequate nutrition will be maintained Outcome: Adequate for Discharge   Problem: Coping: Goal: Level of anxiety will decrease Outcome: Adequate for Discharge   Problem: Elimination: Goal: Will not experience complications related to bowel motility Outcome: Adequate for Discharge Goal: Will not experience complications related to urinary retention Outcome: Adequate for Discharge   Problem: Pain Managment: Goal: General experience of comfort will improve and/or be controlled Outcome: Adequate for Discharge   Problem: Safety: Goal: Ability to remain free from injury will improve Outcome: Adequate for Discharge   Problem: Skin Integrity: Goal: Risk for impaired skin integrity will decrease Outcome: Adequate for Discharge

## 2023-10-06 NOTE — Plan of Care (Signed)
  Problem: Health Behavior/Discharge Planning: Goal: Ability to manage health-related needs will improve Outcome: Progressing   Problem: Metabolic: Goal: Ability to maintain appropriate glucose levels will improve Outcome: Progressing   

## 2023-10-07 ENCOUNTER — Telehealth: Payer: Self-pay | Admitting: *Deleted

## 2023-10-07 NOTE — Transitions of Care (Post Inpatient/ED Visit) (Signed)
   10/07/2023  Name: [name] MRN: [id number] DOB: Aug 16, 1922  Today's TOC FU Call Status: Today's TOC FU Call Status:: Unsuccessful Call (2nd Attempt) Unsuccessful Call (2nd Attempt) Date: 10/07/23  Attempted to reach the patient regarding the most recent Inpatient/ED visit.  Follow Up Plan: Additional outreach attempts will be made to reach the patient to complete the Transitions of Care (Post Inpatient/ED visit) call.   Mliss Creed [facility], BSN RN Care Manager/ Transition of Care [place] [phone]

## 2023-10-07 NOTE — Transitions of Care (Post Inpatient/ED Visit) (Signed)
   10/07/2023  Name: [name] MRN: [id number] DOB: 1922/08/17  Today's TOC FU Call Status: Today's TOC FU Call Status:: Unsuccessful Call (1st Attempt) Unsuccessful Call (1st Attempt) Date: 10/07/23  Attempted to reach the patient regarding the most recent Inpatient/ED visit.  Follow Up Plan: Additional outreach attempts will be made to reach the patient to complete the Transitions of Care (Post Inpatient/ED visit) call.   Mliss Creed [facility], BSN RN Care Manager/ Transition of Care [place]/ [facility] [phone]

## 2023-10-08 ENCOUNTER — Telehealth: Payer: Self-pay | Admitting: *Deleted

## 2023-10-08 ENCOUNTER — Ambulatory Visit: Payer: Self-pay | Admitting: Family Medicine

## 2023-10-08 LAB — URINE CULTURE: Culture: >=100000 — AB

## 2023-10-08 NOTE — Transitions of Care (Post Inpatient/ED Visit) (Signed)
 10/08/2023  Name: [name] MRN: [id number] DOB: 08/19/1922  Today's TOC FU Call Status: Today's TOC FU Call Status:: Successful TOC FU Call Completed TOC FU Call Complete Date: 10/08/23 Patient's Name and Date of Birth confirmed.  Transition Care Management Follow-up Telephone Call Date of Discharge: 10/06/23 Discharge Facility: [facility]) Type of Discharge: Inpatient Admission Primary Inpatient Discharge Diagnosis:: Sepsis secondary to UTI How have you been since you were released from the hospital?:  (eating,drinking well, no concerns reported) Any questions or concerns?: No  Items Reviewed: Did you receive and understand the discharge instructions provided?: Yes Medications obtained,verified, and reconciled?: Yes (Medications Reviewed) Any new allergies since your discharge?: No Dietary orders reviewed?: Yes Type of Diet Ordered:: heart healthy Do you have support at home?: Yes People in Home [RPT]: alone Name of Support/Comfort Primary Source: lives at [facility], has family she can call on, staff and other residents Reviewed signs/ symptoms of infection, UTI, importance of taking antibiotic as prescribed, drinking adequate fluids, preferably water Reviewed safety precautions Reviewed energy conservation  Medications Reviewed Today: Medications Reviewed Today     Reviewed by Aura Mliss LABOR, RN (Registered Nurse) on 10/08/23 at 1216  Med List Status: <None>   Medication Order Taking? Sig Documenting Provider Last Dose Status Informant  atorvastatin  (LIPITOR) 20 MG tablet [id number] Yes Take 1 tablet (20 mg total) by mouth once a week. Katrinka Garnette KIDD, MD  Active   Bisacodyl  (DULCOLAX PO) [id number] Yes Take 1 tablet by mouth as needed. [provider]  Active   cefdinir  (OMNICEF ) 300 MG capsule [id number] Yes Take 1 capsule (300 mg total) by mouth 2 (two) times daily for 12 days. Arshad, Mohsin A, MD  Active   clopidogrel  (PLAVIX ) 75  MG tablet [id number] Yes Take 1 tablet (75 mg total) by mouth daily. TAKE ONE TABLET BY MOUTH DAILY Katrinka Garnette KIDD, MD  Active   diltiazem  (CARDIZEM  CD) 240 MG 24 hr capsule [id number] Yes Take 1 capsule (240 mg total) by mouth daily. Katrinka Garnette KIDD, MD  Active   hydrochlorothiazide  (HYDRODIURIL ) 25 MG tablet [id number] Yes Take 1 tablet (25 mg total) by mouth daily. Katrinka Garnette KIDD, MD  Active   Multiple Vitamins-Minerals (CENTRUM SILVER ) tablet [id number] Yes Take 1 tablet by mouth daily. [provider]  Active Self  naproxen sodium (ALEVE) 220 MG tablet [id number]  Take 220 mg by mouth daily.  Patient not taking: Reported on 10/08/2023   [provider]  Active   pantoprazole  (PROTONIX ) 20 MG tablet [id number] Yes Take 1 tablet (20 mg total) by mouth daily. Katrinka Garnette KIDD, MD  Active   potassium chloride  SA (KLOR-CON  M) 20 MEQ tablet [id number] Yes Take 1 tablet (20 mEq total) by mouth 2 (two) times daily for 7 days. Arshad, Mohsin A, MD  Active   Med List Note Marisa Nathanel LOISE Bishop 10/05/23 1757): FH @ [phone]            Home Care and Equipment/Supplies: Were Home Health Services Ordered?: No Any new equipment or medical supplies ordered?: No  Functional Questionnaire: Do you need assistance with bathing/showering or dressing?: Yes (shower seat) Do you need assistance with meal preparation?: Yes (goes to dining room to eat) Do you need assistance with eating?: No Do you have difficulty maintaining continence: No Do you need assistance with getting out of bed/getting out of a chair/moving?: Yes (uses walker at all times) Do you have difficulty managing or taking your medications?:  No  Follow up appointments reviewed: PCP Follow-up appointment confirmed?: No (pt declines for RN CM to schedule post hospital appointment, states she will do when she is ready) MD Provider Line Number:[phone] Given: No Specialist Hospital Follow-up appointment confirmed?:  NA Do you need transportation to your follow-up appointment?: No Do you understand care options if your condition(s) worsen?: Yes-patient verbalized understanding  SDOH Interventions Today    Flowsheet Row Most Recent Value  SDOH Interventions   Food Insecurity Interventions Intervention Not Indicated  Housing Interventions Intervention Not Indicated  Transportation Interventions Intervention Not Indicated  Utilities Interventions Intervention Not Indicated   Mliss Creed [facility], BSN RN Care Manager/ Transition of Care [place]/ [facility] Population Health [phone]

## 2023-10-09 LAB — CULTURE, BLOOD (SINGLE)
Culture: NO GROWTH
Special Requests: ADEQUATE

## 2023-11-03 ENCOUNTER — Telehealth: Payer: Self-pay | Admitting: Family Medicine

## 2023-11-03 NOTE — Telephone Encounter (Signed)
 Left vm for pt stating we faxed back the order for the Power Chair 10/27/2023.    Copied from CRM [id number]. Topic: Clinical - Order For Equipment >> Oct 31, 2023 11:05 AM Jasmin G wrote: Reason for CRM: Pt sates that her Home Health requested a motorized wheelchair, but has not heard back, I could not find any info to relay, please call her back at [phone] to discuss.

## 2023-11-12 NOTE — Telephone Encounter (Unsigned)
 Copied from CRM #[id number]. Topic: Clinical - Medical Advice >> Nov 12, 2023 10:28 AM Charolett L wrote: Reason for CRM: [facility] Orders for a power chair and occupational therapy to be signed by the doctor   Fax# [phone]

## 2023-11-12 NOTE — Telephone Encounter (Signed)
 Faxed forms again. Sent to Attn: teresa crews

## 2024-01-29 ENCOUNTER — Telehealth: Payer: Self-pay | Admitting: Family Medicine

## 2024-01-29 NOTE — Telephone Encounter (Signed)
 If the area does not heal up we can certainly see her sooner than the January visit.  Please thank them for letting us  know

## 2024-01-29 NOTE — Telephone Encounter (Signed)
 Noted. Will call and check in on patient on Monday

## 2024-01-29 NOTE — Telephone Encounter (Signed)
 Called patient and she stated her right leg was injured when she was shaving awhile back and its just been crusting over and she's been putting vasoline on it to soften the crust and today she cut off the crust and it bled like crazy. The nurses at [facility] where she lives just wanted Dr. Katrinka to be aware of what happened. They got the bleeding to stop and have it wrapped.    Copied from CRM [id number]. Topic: Clinical - Medical Advice >> Jan 29, 2024  1:43 PM Rea ORN wrote: Reason for CRM: Pt would like PCP to call her today concerning something that happened to her today. Pt would not disclose to me what happened but did say is was non emergent.   Please call back [phone]

## 2024-01-30 ENCOUNTER — Emergency Department (HOSPITAL_COMMUNITY)

## 2024-01-30 ENCOUNTER — Emergency Department (HOSPITAL_COMMUNITY)
Admission: EM | Admit: 2024-01-30 | Discharge: 2024-01-30 | Disposition: A | Discharge status: Home / Self Care | Attending: Emergency Medicine | Admitting: Emergency Medicine

## 2024-01-30 ENCOUNTER — Other Ambulatory Visit: Payer: Self-pay

## 2024-01-30 ENCOUNTER — Emergency Department (EMERGENCY_DEPARTMENT_HOSPITAL)

## 2024-01-30 DIAGNOSIS — M7989 Other specified soft tissue disorders: Secondary | ICD-10-CM | POA: Diagnosis present

## 2024-01-30 DIAGNOSIS — Z87440 Personal history of urinary (tract) infections: Secondary | ICD-10-CM | POA: Insufficient documentation

## 2024-01-30 DIAGNOSIS — D72829 Elevated white blood cell count, unspecified: Secondary | ICD-10-CM | POA: Insufficient documentation

## 2024-01-30 DIAGNOSIS — Z8673 Personal history of transient ischemic attack (TIA), and cerebral infarction without residual deficits: Secondary | ICD-10-CM | POA: Diagnosis not present

## 2024-01-30 DIAGNOSIS — E876 Hypokalemia: Secondary | ICD-10-CM | POA: Insufficient documentation

## 2024-01-30 DIAGNOSIS — Z7902 Long term (current) use of antithrombotics/antiplatelets: Secondary | ICD-10-CM | POA: Insufficient documentation

## 2024-01-30 DIAGNOSIS — N189 Chronic kidney disease, unspecified: Secondary | ICD-10-CM | POA: Insufficient documentation

## 2024-01-30 DIAGNOSIS — K219 Gastro-esophageal reflux disease without esophagitis: Secondary | ICD-10-CM | POA: Insufficient documentation

## 2024-01-30 DIAGNOSIS — R531 Weakness: Secondary | ICD-10-CM | POA: Insufficient documentation

## 2024-01-30 DIAGNOSIS — E785 Hyperlipidemia, unspecified: Secondary | ICD-10-CM | POA: Insufficient documentation

## 2024-01-30 DIAGNOSIS — E1122 Type 2 diabetes mellitus with diabetic chronic kidney disease: Secondary | ICD-10-CM | POA: Diagnosis not present

## 2024-01-30 DIAGNOSIS — R0602 Shortness of breath: Secondary | ICD-10-CM

## 2024-01-30 DIAGNOSIS — R350 Frequency of micturition: Secondary | ICD-10-CM | POA: Insufficient documentation

## 2024-01-30 DIAGNOSIS — M25559 Pain in unspecified hip: Secondary | ICD-10-CM | POA: Diagnosis not present

## 2024-01-30 DIAGNOSIS — D649 Anemia, unspecified: Secondary | ICD-10-CM | POA: Diagnosis not present

## 2024-01-30 DIAGNOSIS — Z79899 Other long term (current) drug therapy: Secondary | ICD-10-CM | POA: Insufficient documentation

## 2024-01-30 DIAGNOSIS — R600 Localized edema: Secondary | ICD-10-CM | POA: Diagnosis present

## 2024-01-30 DIAGNOSIS — I129 Hypertensive chronic kidney disease with stage 1 through stage 4 chronic kidney disease, or unspecified chronic kidney disease: Secondary | ICD-10-CM | POA: Diagnosis not present

## 2024-01-30 DIAGNOSIS — R7401 Elevation of levels of liver transaminase levels: Secondary | ICD-10-CM | POA: Diagnosis not present

## 2024-01-30 LAB — COMPREHENSIVE METABOLIC PANEL WITH GFR
ALT: 19 U/L (ref 0–44)
AST: 23 U/L (ref 15–41)
Albumin: 3.5 g/dL (ref 3.5–5.0)
Alkaline Phosphatase: 120 U/L (ref 38–126)
Anion gap: 13 (ref 5–15)
BUN: 17 mg/dL (ref 8–23)
CO2: 27 mmol/L (ref 22–32)
Calcium: 9.1 mg/dL (ref 8.9–10.3)
Chloride: 96 mmol/L — ABNORMAL LOW (ref 98–111)
Creatinine, Ser: 1.08 mg/dL — ABNORMAL HIGH (ref 0.44–1.00)
GFR, Estimated: 46 mL/min — ABNORMAL LOW (ref >60)
Glucose, Bld: 209 mg/dL — ABNORMAL HIGH (ref 70–99)
Potassium: 3.2 mmol/L — ABNORMAL LOW (ref 3.5–5.1)
Sodium: 136 mmol/L (ref 135–145)
Total Bilirubin: 0.7 mg/dL (ref 0.0–1.2)
Total Protein: 7.1 g/dL (ref 6.5–8.1)

## 2024-01-30 LAB — BRAIN NATRIURETIC PEPTIDE: B Natriuretic Peptide: 222 pg/mL — ABNORMAL HIGH (ref 0.0–100.0)

## 2024-01-30 LAB — I-STAT CHEM 8, ED
BUN: 18 mg/dL (ref 8–23)
Calcium, Ion: 1.04 mmol/L — ABNORMAL LOW (ref 1.15–1.40)
Chloride: 101 mmol/L (ref 98–111)
Creatinine, Ser: 1.1 mg/dL — ABNORMAL HIGH (ref 0.44–1.00)
Glucose, Bld: 220 mg/dL — ABNORMAL HIGH (ref 70–99)
HCT: 39 % (ref 36.0–46.0)
Hemoglobin: 13.3 g/dL (ref 12.0–15.0)
Potassium: 3.2 mmol/L — ABNORMAL LOW (ref 3.5–5.1)
Sodium: 137 mmol/L (ref 135–145)
TCO2: 29 mmol/L (ref 22–32)

## 2024-01-30 LAB — URINALYSIS, W/ REFLEX TO CULTURE (INFECTION SUSPECTED)
Bacteria, UA: NONE SEEN
Bilirubin Urine: NEGATIVE
Glucose, UA: NEGATIVE mg/dL
Hgb urine dipstick: NEGATIVE
Ketones, ur: NEGATIVE mg/dL
Leukocytes,Ua: NEGATIVE
Nitrite: NEGATIVE
Protein, ur: NEGATIVE mg/dL
Specific Gravity, Urine: 1.008 (ref 1.005–1.030)
pH: 7 (ref 5.0–8.0)

## 2024-01-30 LAB — CBC WITH DIFFERENTIAL/PLATELET
Abs Immature Granulocytes: 0.07 K/uL (ref 0.00–0.07)
Basophils Absolute: 0.1 K/uL (ref 0.0–0.1)
Basophils Relative: 1 %
Eosinophils Absolute: 0.2 K/uL (ref 0.0–0.5)
Eosinophils Relative: 1 %
HCT: 40.7 % (ref 36.0–46.0)
Hemoglobin: 13.7 g/dL (ref 12.0–15.0)
Immature Granulocytes: 1 %
Lymphocytes Relative: 20 %
Lymphs Abs: 2.4 K/uL (ref 0.7–4.0)
MCH: 29.7 pg (ref 26.0–34.0)
MCHC: 33.7 g/dL (ref 30.0–36.0)
MCV: 88.1 fL (ref 80.0–100.0)
Monocytes Absolute: 0.9 K/uL (ref 0.1–1.0)
Monocytes Relative: 8 %
Neutro Abs: 8.3 K/uL — ABNORMAL HIGH (ref 1.7–7.7)
Neutrophils Relative %: 69 %
Platelets: 305 K/uL (ref 150–400)
RBC: 4.62 MIL/uL (ref 3.87–5.11)
RDW: 12.8 % (ref 11.5–15.5)
WBC: 11.9 K/uL — ABNORMAL HIGH (ref 4.0–10.5)
nRBC: 0 % (ref 0.0–0.2)

## 2024-01-30 LAB — TROPONIN I (HIGH SENSITIVITY)
Troponin I (High Sensitivity): 16 ng/L (ref <18)
Troponin I (High Sensitivity): 21 ng/L — ABNORMAL HIGH (ref <18)

## 2024-01-30 LAB — CK: Total CK: 47 U/L (ref 38–234)

## 2024-01-30 LAB — MAGNESIUM: Magnesium: 1.7 mg/dL (ref 1.7–2.4)

## 2024-01-30 MED ORDER — ACETAMINOPHEN 500 MG PO TABS
1000.0000 mg | ORAL_TABLET | Freq: Once | ORAL | Status: AC
  Administered 2024-01-30: 1000 mg via ORAL
  Filled 2024-01-30: qty 2

## 2024-01-30 MED ORDER — KETOROLAC TROMETHAMINE 15 MG/ML IJ SOLN
15.0000 mg | Freq: Once | INTRAMUSCULAR | Status: AC
  Administered 2024-01-30: 15 mg via INTRAVENOUS
  Filled 2024-01-30: qty 1

## 2024-01-30 MED ORDER — IOHEXOL 350 MG/ML SOLN
75.0000 mL | Freq: Once | INTRAVENOUS | Status: AC | PRN
Start: 2024-01-30 — End: 2024-01-30
  Administered 2024-01-30: 75 mL via INTRAVENOUS

## 2024-01-30 MED ORDER — POTASSIUM CHLORIDE 20 MEQ PO PACK
40.0000 meq | PACK | Freq: Two times a day (BID) | ORAL | Status: DC
  Administered 2024-01-30: 40 meq via ORAL
  Filled 2024-01-30: qty 2

## 2024-01-30 MED ORDER — LIDOCAINE 5 % EX PTCH
1.0000 | MEDICATED_PATCH | Freq: Once | CUTANEOUS | Status: DC
  Administered 2024-01-30: 1 via TRANSDERMAL
  Filled 2024-01-30: qty 1

## 2024-01-30 MED ORDER — MAGNESIUM SULFATE 2 GM/50ML IV SOLN
2.0000 g | Freq: Once | INTRAVENOUS | Status: AC
  Administered 2024-01-30: 2 g via INTRAVENOUS
  Filled 2024-01-30: qty 50

## 2024-01-30 NOTE — ED Notes (Signed)
 Placed patient on the bedpan patient is now off changed patient gown placed a brief and placed a external cath

## 2024-01-30 NOTE — Progress Notes (Signed)
 Venous duplex lower ext  has been completed. Refer to [facility] under chart review to view preliminary results.   01/30/2024  10:29 AM Jekhi Bolin, Ricka BIRCH

## 2024-01-30 NOTE — ED Provider Notes (Signed)
 [place] [facility] AT [facility] Provider Note   CSN: [id number] Arrival date & time: 01/30/24  0813     History Chief Complaint  Patient presents with   Weakness    HPI: [name] is Santos 89 y.o. female with history pertinent for HTN, GERD, prior CVA, HLD, T2DM, CKD, prior UTI who presents complaining of weakness. Patient [facility] via EMS from [facility].  History provided by patient and EMS.  No interpreter required during this encounter.  EMS reports that they were called out by patient because she felt weak and had difficulty getting out of bed this morning.  Patient reports that the last day that she was completely at her baseline was 2 days ago, reports that since then she has had generalized weakness, reports that she got busy doing her laundry yesterday, therefore forgot to take her medication including her Lasix  yesterday, and noted increased left lower extremity swelling today.  Reports that she also feels increased weakness on her left side relative to her right, again notes that the last time she felt normal with regard to this was approximately 2 days ago.  Denies any difficulty speaking, fever, chills, chest pain, shortness of breath, nausea, vomiting, diarrhea, does report that she has had urinary frequency.  EMS reports that they were able to visualize the patient walking with her rollator on scene.  Patient's recorded medical, surgical, social, medication list and allergies were reviewed in the Snapshot window as part of the initial history.   Prior to Admission medications   Medication Sig Start Date End Date Taking? Authorizing Provider  atorvastatin  (LIPITOR) 20 MG tablet Take 1 tablet (20 mg total) by mouth once Santos week. 09/19/23   [name] Garnette KIDD, MD  Bisacodyl  (DULCOLAX PO) Take 1 tablet by mouth as needed.    [provider]  clopidogrel  (PLAVIX ) 75 MG tablet Take 1 tablet (75 mg total) by mouth daily. TAKE ONE TABLET BY MOUTH DAILY 09/19/23    [name] Garnette KIDD, MD  diltiazem  (CARDIZEM  CD) 240 MG 24 hr capsule Take 1 capsule (240 mg total) by mouth daily. 09/19/23   [name] Garnette KIDD, MD  hydrochlorothiazide  (HYDRODIURIL ) 25 MG tablet Take 1 tablet (25 mg total) by mouth daily. 09/19/23   [name] Garnette KIDD, MD  Multiple Vitamins-Minerals (CENTRUM SILVER ) tablet Take 1 tablet by mouth daily.    [provider]  naproxen sodium (ALEVE) 220 MG tablet Take 220 mg by mouth daily. Patient not taking: Reported on 10/08/2023    [provider]  pantoprazole  (PROTONIX ) 20 MG tablet Take 1 tablet (20 mg total) by mouth daily. 09/19/23   [name] Garnette KIDD, MD  potassium chloride  SA (KLOR-CON  M) 20 MEQ tablet Take 1 tablet (20 mEq total) by mouth 2 (two) times daily for 7 days. 10/06/23 10/13/23  Arshad, Mohsin A, MD     Allergies: Patient has no known allergies.   Review of Systems   ROS as per HPI  Physical Exam Updated Vital Signs BP (!) 171/89   Pulse 94   Temp 98.1 F (36.7 C) (Oral)   Resp 17   Ht 5' 2 (1.575 m)   Wt 76 kg   SpO2 96%   BMI 30.65 kg/m  Physical Exam Vitals and nursing note reviewed.  Constitutional:      General: She is not in acute distress.    Appearance: She is well-developed.  HENT:     Head: Normocephalic and atraumatic.  Eyes:  Conjunctiva/sclera: Conjunctivae normal.  Cardiovascular:     Rate and Rhythm: Normal rate and regular rhythm.     Heart sounds: No murmur heard. Pulmonary:     Effort: Pulmonary effort is normal. No respiratory distress.     Breath sounds: Normal breath sounds.  Abdominal:     Palpations: Abdomen is soft.     Tenderness: There is no abdominal tenderness.  Musculoskeletal:        General: No swelling.     Cervical back: Neck supple.  Skin:    General: Skin is warm and dry.     Capillary Refill: Capillary refill takes less than 2 seconds.  Neurological:     Mental Status: She is alert.     Motor: Weakness (Diffusely 4+/5, 4/5 in RLE) present.   Psychiatric:        Mood and Affect: Mood normal.     ED Course/ Medical Decision Making/ Santos&P    Procedures Procedures   Medications Ordered in ED Medications - No data to display  Medical Decision Making:   [name] is Santos 89 y.o. female who presents for weakness as per above.  Physical exam is pertinent for right lower extremity 3/5, otherwise globally mildly weak at 4+/5.   The differential includes but is not limited to stroke, ICH, rhabdomyolysis, ACS, volume overload, DVT, UTI, metabolic derangement, infection.  Independent historian: EMS  External data reviewed: Chem-8 with  Initial Plan:  Screening labs including CBC and Metabolic panel to evaluate for infectious or metabolic etiology of disease.  CK to evaluate for rhabdomyolysis Magnesium  to evaluate for hypomagnesemia BNP to evaluate for volume overload given bilateral lower extremity swelling Urinalysis with reflex culture ordered to evaluate for UTI or relevant urologic/nephrologic pathology.  DVT ultrasound to evaluate for DVT given bilateral lower extremity pain/swelling CTA head and neck to evaluate for ICH, acute stroke given patient reports unilateral left-sided weakness, and has localizing deficits (right lower extremity weakness) on exam EKG and serial troponin to evaluate for cardiac pathology. Objective evaluation as below reviewed   Labs: Ordered, Independent interpretation, and Details: Chem-8  mild hypokalemia similar in comparison to prior.  Mild elevation of creatinine to 1.1 also similar in comparison to prior. CBC with mild nonspecific leukocytosis to 11.9, anemia, cytopenia.  UA without evidence of UTI.  Magnesium  low normal.  CK WNL.  BNP slightly elevated at 222.  CMP without AKI, emergent electro gentian, emergent LFT abnormality, mild hypokalemia similar to Chem-8.  Initial troponin 16, delta 21.  Radiology: Ordered, Independent interpretation, Details: DVT ultrasound not available my view,  therefore I did not personally review.  Personally reviewed CTA without LVO, ICH, loss of gray/white matter differentiation. Agree with radiology report at this time.   No results found.  EKG/Medicine tests: Ordered and Independent interpretation EKG Interpretation: Sinus rhythm RSR' in V1 or V2, right VCD or RVH Borderline prolonged QT interval Confirmed by Rogelia Satterfield ([id number]) on 01/30/2024 8:28:26 AM  Interventions: Potassium repletion, magnesium  repletion, Toradol , Tylenol , lidocaine  patch  See the EMR for full details regarding lab and imaging results.  Patient presents complaining of generalized weakness, does have mild weakness of right lower extremity on exam, however patient reports that her weakness was primarily left-sided, however also patient describes it as generalized.  Given patient does not have LNK within the past 24 hours, will not activate as code stroke, however do feel that patient warrants imaging of the head and neck to evaluate for stroke given weakness as well  as additional screening labs for other metabolic etiologies.  Given patient's worsening of her leg swelling, will obtain BMP to evaluate volume status, as well as rule out DVT with ultrasound.  Patient does have mild hypokalemia on labs, which was repleted orally.  Magnesium  level low end of normal, therefore we will give repletion as this could be contributing to patient's weakness, and potentially contributing to QTc prolongation on EKG.   Overall labs are reassuring.  Mild mental outlook derangements as per above for repleted.  Patient does have mild nonspecific leukocytosis, however in the absence of fever or other localizing symptoms, doubt sepsis, infection.  Doubt rhabdomyolysis given CK WNL.  BNP mildly elevated, however patient saturating well on room air.  Troponin flat, doubt ACS.  UA without UTI.  No evidence of stroke on CTA.  DVT ultrasound negative for DVT.  After obtaining these imaging studies,  patient did complain of hip pain which she attributed to the uncomfortable ED bed and CT board, therefore was treated with lidocaine  patch, Toradol , Tylenol , but stated that aside from the new hip pain she otherwise felt at baseline.  On reevaluation after pain medication she reports that she feels entirely at baseline.  Trialed ambulation with ED walker, patient was able to ambulate and she reports that she is doing so at her baseline.  Patient does not have any chest pain or shortness of breath with ambulation, no additional symptoms.  Did recommend following up with PCP for subclavian artery arterial sclerosis, as well as continuing her home diuretics given her elevated BNP, however given patient feels at baseline and workup is overall reassuring, do not feel that patient requires admission for diuresis.  Patient comfortable with this plan, discharged back home and friend presented to the ED to give patient Santos ride home.  Presentation is most consistent with acute complicated illness, Current presentation is complicated by underlying chronic conditions, and I did consider and rule out acute life/limb-threatening illness  Discussion of management or test interpretations with external provider(s): Not indicated  Risk Drugs:OTC drugs and Prescription drug management  Disposition: DISCHARGE: I believe that the patient is safe for discharge home with outpatient follow-up. Patient was informed of all pertinent physical exam, laboratory, and imaging findings. Patient's suspected etiology of their symptom presentation was discussed with the patient and all questions were answered. We discussed following up with PCP. I provided thorough ED return precautions. The patient feels safe and comfortable with this plan.  MDM generated using voice dictation software and may contain dictation errors.  Please contact me for any clarification or with any questions.  Clinical Impression:  1. Weakness      Data  Unavailable   Final Clinical Impression(s) / ED Diagnoses Final diagnoses:  Weakness    Rx / DC Orders ED Discharge Orders     None        Rogelia Jerilynn RAMAN, MD 02/02/24 1531

## 2024-01-30 NOTE — ED Notes (Signed)
Help get patient into a gown on the monitor did EKG shown to er provider patient is resting with call bell in reach  

## 2024-01-30 NOTE — Discharge Instructions (Addendum)
 [name]  Thank you for allowing us  to take care of you today.  You came to the Emergency Department today because you felt generalized weakness, and felt that it may be a little bit worse on your left side.  Here your right leg was initially little bit weaker than your left leg, therefore we got CTs of your head and neck which did not show any new abnormalities but does show that you have some plaque buildup in the blood vessels of your chest, you should continue to follow-up with your outpatient doctor for management of this.  Otherwise your labs showed that your magnesium  potassium are little bit low, therefore we gave you replacement of these electrolytes.  We gave you some pain medicine for your hip pain that was related to the uncomfortable CT scanner/bed, and you are now able to walk with your walker and you feel like usual self, given your workup is reassuring and you feel back to usual self, you are safe to continue to follow-up outpatient with your primary doctor.   To-Do: 1. Please follow-up with your primary doctor within 1 - 2 weeks / as soon as possible.   Please return to the Emergency Department or call 911 if you experience have worsening of your symptoms, or do not get better, chest pain, shortness of breath, severe or significantly worsening pain, high fever, severe confusion, pass out or have any reason to think that you need emergency medical care.   We hope you feel better soon.   Mitzie Later, MD Department of Emergency Medicine [facility] [place]

## 2024-01-30 NOTE — ED Triage Notes (Signed)
 Pt from home. Pt forgot to take meds yesterday. Last night had trouble getting around. Unable to get out of bed this am. C/O left side weakness with fluid retention in leg. Stroke screen neg. Increased urination yesterday without pain discoloration and slight odor. Nausea with ems. No meds today.   EMS VS 184/94 HR  90 95%  CBG 236

## 2024-02-03 ENCOUNTER — Telehealth: Payer: Self-pay | Admitting: Family Medicine

## 2024-02-03 ENCOUNTER — Telehealth: Payer: Self-pay

## 2024-02-03 NOTE — Telephone Encounter (Signed)
 Left vm for theresa that we have faxed the fl2 form and we have confirmation that it was sent at 1237pm today. Also she does not need to be on potassium chloride  that was onlu written for 7 days back in July.   Copied from CRM #[id number]. Topic: General - Other >> Feb 03, 2024  1:21 PM Alexandria E wrote: Reason for CRM: Zebedee from [facility] called back stating she has faxed over a FL2 form that needs to be signed. Also stated that patient has a potassium chloride  order in that they do not currently have, checking to see if they still want patient to have this. Also will be needing an order for a dressing change to patient's right leg. Callback number for Zebedee is [phone].

## 2024-02-03 NOTE — Telephone Encounter (Signed)
 Transition Care Management Follow-up Telephone Call Date of discharge and from where: 01/30/24 [name] ED How have you been since you were released from the hospital? Worse Any questions or concerns? Yes  Items Reviewed: Did the pt receive and understand the discharge instructions provided? Yes  Medications obtained and verified? Yes  Other? No  Any new allergies since your discharge? No  Dietary orders reviewed? No Do you have support at home? Yes   Home Care and Equipment/Supplies: Were home health services ordered? no If so, what is the name of the agency?   Has the agency set up a time to come to the patient's home? no Were any new equipment or medical supplies ordered?  No What is the name of the medical supply agency? N/A Were you able to get the supplies/equipment? no Do you have any questions related to the use of the equipment or supplies? No  Functional Questionnaire: (I = Independent and D = Dependent) ADLs: D  Bathing/Dressing- D  Meal Prep- D  Eating- D  Maintaining continence- D  Transferring/Ambulation- D  Managing Meds- D  Follow up appointments reviewed:  PCP Hospital f/u appt confirmed?  Specialist Hospital f/u appt confirmed?  Are transportation arrangements needed? Yes  If their condition worsens, is the pt aware to call PCP or go to the Emergency Dept.? Yes Was the patient provided with contact information for the PCP's office or ED? Yes Was to pt encouraged to call back with questions or concerns? Yes   Spoke with patient to complete TOC call and schedule follow up with PCP. Pt advised in the past week she has lost ability to walk with her walker, or do anything. She had a sudden change within 2 days and c/o being very weak. Pt thinks she may have had a stroke however ED ruled this out. Pt had abnormal labs at ED and advised to follow up with PCP. Pt states nurse at [facility] states they are going to contact PCP and thinks it would be best for  patient to move up to SNF due to needing 24 hr care at this time. Pt states she is unable to come into the office for an appointment due to transportation and unable to safely walk. Advised pt would speak directly with PCP and give her a call back. Pt also gave verbal permission to call and speak with her nurse at the [name]).  Called and spoke with nurse Verneita at [facility], she is completing FL2 form and faxing over to our office by noon today for signature from PCP to move patient over to Skilled nursing Unit.

## 2024-02-03 NOTE — Telephone Encounter (Signed)
 We also discussed really only 2 options here- Emergency Department return for repeat evaluation or SNF- especially since she cannot physically get here. Hoping nursing will evaluation and recommend Emergency Department if needed

## 2024-02-03 NOTE — Telephone Encounter (Signed)
 Left another vm for theresa regarding confirmation of fax today at 1237pm and for the potasium was from July. Let her know we can do verbal order for dressing change or asked what kind of order she needs.   Copied from CRM [id number]. Topic: Clinical - Medical Advice >> Feb 03, 2024  2:29 PM Charolett L wrote: Reason for CRM: Zebedee from [facility] called back stating she has faxed over a FL2 form that needs to be signed. Also stated that patient has a potassium chloride  order for 2x a day for 7 days checking to see if they still want patient to have this. Also will be needing an order for a dressing change to patient's right leg. Also needs PT therapy added for mobility Callback number for Zebedee is [phone].

## 2024-02-03 NOTE — Telephone Encounter (Signed)
 Spoke with patient to complete TOC call and schedule follow up with PCP. Pt advised in the past week she has lost ability to walk with her walker, or do anything in her normal functions. She had a sudden change within 2 days and c/o being very weak. Pt thinks she may have had a stroke however ED ruled this out. Pt had abnormal labs at ED and advised to follow up with PCP. Pt states nurse at [facility] states they are going to contact PCP and thinks it would be best for patient to move up to SNF due to needing 24 hr care at this time. Pt states she is unable to come into the office for an appointment due to transportation and unable to safely walk. Advised pt would speak directly with PCP and give her a call back. Pt also gave verbal permission to call and speak with her nurse at the [facility]).    Called and spoke with nurse Verneita at [facility], she is completing FL2 form and faxing over to our office by noon today for signature from PCP to move patient over to Skilled nursing Unit.

## 2024-02-04 ENCOUNTER — Telehealth: Payer: Self-pay | Admitting: Family Medicine

## 2024-02-04 NOTE — Telephone Encounter (Signed)
 [employer] FL2, to be filled out by provider. Patient requested to send it back via Fax within ASAP. Document is located in providers tray at front office.Please advise at [phone] (phone)

## 2024-02-09 NOTE — Telephone Encounter (Signed)
 Noted

## 2024-02-16 ENCOUNTER — Encounter: Payer: Self-pay | Admitting: Nurse Practitioner

## 2024-02-16 ENCOUNTER — Non-Acute Institutional Stay (SKILLED_NURSING_FACILITY): Payer: Self-pay | Admitting: Nurse Practitioner

## 2024-02-16 DIAGNOSIS — R609 Edema, unspecified: Secondary | ICD-10-CM

## 2024-02-16 DIAGNOSIS — E785 Hyperlipidemia, unspecified: Secondary | ICD-10-CM

## 2024-02-16 DIAGNOSIS — N1831 Chronic kidney disease, stage 3a: Secondary | ICD-10-CM | POA: Diagnosis not present

## 2024-02-16 DIAGNOSIS — I10 Essential (primary) hypertension: Secondary | ICD-10-CM

## 2024-02-16 DIAGNOSIS — E876 Hypokalemia: Secondary | ICD-10-CM | POA: Diagnosis not present

## 2024-02-16 DIAGNOSIS — E1169 Type 2 diabetes mellitus with other specified complication: Secondary | ICD-10-CM

## 2024-02-16 DIAGNOSIS — K219 Gastro-esophageal reflux disease without esophagitis: Secondary | ICD-10-CM

## 2024-02-16 NOTE — Assessment & Plan Note (Signed)
 Permissive blood pressures control,  continue diltiazem 

## 2024-02-16 NOTE — Assessment & Plan Note (Signed)
 K 3.2 in ED Repeat CMP/eGFR

## 2024-02-16 NOTE — Progress Notes (Unsigned)
 Location:   [facility] Room Number: 67 Place of Service:  SNF (31) Provider: Larwance Alisabeth Selkirk NP  Katrinka Garnette KIDD, MD  Patient Care Team: Katrinka Garnette KIDD, MD as PCP - General (Family Medicine) Rosan Credit, MD as Consulting Physician (Ophthalmology) [facility], [facility]  Extended Emergency Contact Information Primary Emergency Contact: [facility]  [place] Mobile Phone: [phone] Relation: Niece Secondary Emergency Contact: Valma Dagoberto Crane Phone: [phone] Relation: Friend  Code Status: DNR Goals of care: Advanced Directive information    01/30/2024    8:17 AM  Advanced Directives  Does Patient Have a Medical Advance Directive? No     Chief Complaint  Patient presents with   Acute Visit    Medication review upon skilled nursing facility admission    HPI:  Pt is a 89 y.o. female seen today for an acute visit for medication review following SNF admission  01/30/2024 ED eval for general lysed weakness, CT angio head/neck, labs, noted hypokalemia, negative urine analysis for UTI, mild elevated BNP 222, potassium repletion  Hypokalemia, K3.2 01/30/2024,   Edema BLE, negative DVT venous ultrasound, taking diuretics-hydrochloric, BNP 222 01/30/24 Subclavian artery sclerosis, taking atorvastatin   HTN, taking diltiazem   GERD, stable, Hgb 13.3 01/30/24, taking pantoprazole   HLD, LDL 82 09/19/23 taking atorvastatin   T2DM, Hgb A1c 6.9 09/19/23, Vit B12 658 09/19/23, TSH 0.61 08/30/23  CKD, Bun/creat 18/1.10 01/30/24   Past Medical History:  Diagnosis Date   Adenomatous polyps    Arthritis    Cerebrovascular accident (HCC)    Cervical cancer (HCC)    Cervical Ca-no radiation or chemo   Diverticulosis    Edema    Effusion of ankle and foot joint    GERD (gastroesophageal reflux disease)    History of UTI    Hypertension    Macular degeneration    Observation for suspected cardiovascular disease    Other  abnormal blood chemistry    Peptic ulcer disease    Pyloric stenosis    S/P total abdominal hysterectomy    Special screening for malignant neoplasm of colon    Past Surgical History:  Procedure Laterality Date   ABDOMINAL HYSTERECTOMY     including cervix   CATARACT EXTRACTION  2008   Dr Rosan   cyst on right hand removed     TEE WITHOUT CARDIOVERSION  11/20/2011   TEE with bubble study-PFO    No Known Allergies  Allergies as of 02/16/2024   No Known Allergies      Medication List        Accurate as of February 16, 2024 11:59 PM. If you have any questions, ask your nurse or doctor.          atorvastatin  20 MG tablet Commonly known as: LIPITOR Take 1 tablet (20 mg total) by mouth once a week.   Centrum Silver  tablet Take 1 tablet by mouth daily.   clopidogrel  75 MG tablet Commonly known as: PLAVIX  Take 1 tablet (75 mg total) by mouth daily. TAKE ONE TABLET BY MOUTH DAILY   diltiazem  240 MG 24 hr capsule Commonly known as: CARDIZEM  CD Take 1 capsule (240 mg total) by mouth daily.   DULCOLAX PO Take 1 tablet by mouth as needed.   hydrochlorothiazide  25 MG tablet Commonly known as: HYDRODIURIL  Take 1 tablet (25 mg total) by mouth daily.   naproxen sodium 220 MG tablet Commonly known as: ALEVE Take 220 mg by mouth daily.   pantoprazole  20 MG tablet Commonly known  as: PROTONIX  Take 1 tablet (20 mg total) by mouth daily.   potassium chloride  SA 20 MEQ tablet Commonly known as: KLOR-CON  M Take 1 tablet (20 mEq total) by mouth 2 (two) times daily for 7 days.        Review of Systems  Constitutional:  Negative for appetite change and fatigue.  HENT:  Negative for congestion and trouble swallowing.   Eyes:  Negative for visual disturbance.  Respiratory:  Negative for cough and wheezing.   Cardiovascular:  Positive for leg swelling. Negative for chest pain and palpitations.  Gastrointestinal:  Negative for abdominal pain, constipation, nausea and  vomiting.  Genitourinary:  Negative for dysuria and urgency.  Musculoskeletal:  Positive for arthralgias and gait problem.  Skin:  Negative for color change.  Neurological:  Negative for syncope and headaches.       RLE weakness, muscle strength 5/5  Psychiatric/Behavioral:  Negative for confusion and sleep disturbance. The patient is not nervous/anxious.     Immunization History  Administered Date(s) Administered   Influenza-Unspecified 01/09/2018   Moderna Sars-Covid-2 Vaccination 04/12/2019, 05/10/2019, 01/18/2020, 08/08/2020   Pfizer Covid-19 Vaccine Bivalent Booster 50yrs & up 11/28/2020   Pertinent  Health Maintenance Due  Topic Date Due   OPHTHALMOLOGY EXAM  09/25/2023   Influenza Vaccine  10/10/2023   Bone Density Scan  06/27/2024 (Originally [birth year]/1990)   HEMOGLOBIN A1C  03/21/2024   FOOT EXAM  09/18/2024      08/22/2021   10:58 AM 06/04/2022   12:04 PM 08/30/2022    9:43 AM 07/22/2023    2:31 PM 10/08/2023   12:28 PM  Fall Risk  Falls in the past year? 1 0 0 0 0  Was there an injury with Fall? 1  0   0    Fall Risk Category Calculator 2 0  0   Fall Risk Category (Retired) Moderate       (RETIRED) Patient Fall Risk Level Low fall risk       Patient at Risk for Falls Due to History of fall(s) Impaired vision;Impaired balance/gait  Impaired balance/gait;Impaired mobility   Fall risk Follow up Falls evaluation completed;Falls prevention discussed  Falls prevention discussed  Falls prevention discussed Falls evaluation completed;Falls prevention discussed     Data saved with a previous flowsheet row definition   Functional Status Survey:    Vitals:   02/16/24 1229 02/17/24 1204  BP: (!) 176/80 (!) 144/78  Pulse: 79   Resp: 18   Temp: (!) 97.1 F (36.2 C)   SpO2: 95%   Weight: 161 lb 3.2 oz (73.1 kg)    Body mass index is 29.48 kg/m. Physical Exam Vitals and nursing note reviewed.  Constitutional:      Appearance: Normal appearance. She is well-developed.   HENT:     Head: Normocephalic and atraumatic.     Nose: Nose normal.     Mouth/Throat:     Mouth: Mucous membranes are moist.  Eyes:     Extraocular Movements: Extraocular movements intact.     Conjunctiva/sclera: Conjunctivae normal.     Pupils: Pupils are equal, round, and reactive to light.  Neck:     Trachea: No tracheal deviation.  Cardiovascular:     Rate and Rhythm: Normal rate and regular rhythm.     Heart sounds: No murmur heard. Pulmonary:     Effort: Pulmonary effort is normal. No respiratory distress.     Breath sounds: No wheezing, rhonchi or rales.  Abdominal:     General: Bowel  sounds are normal.     Palpations: Abdomen is soft.     Tenderness: There is no abdominal tenderness.  Musculoskeletal:        General: No tenderness. Normal range of motion.     Cervical back: Normal range of motion and neck supple.     Right lower leg: Edema present.     Left lower leg: Edema present.     Comments: Trace edema BLE  Skin:    General: Skin is warm and dry.     Findings: No rash.     Comments: Very faint hyperpigmentation on L and R medial ankles. Significantly improved from last visit. Minimal dry skin on the L.  Neurological:     General: No focal deficit present.     Mental Status: She is alert and oriented to person, place, and time. Mental status is at baseline.     Motor: No weakness.     Coordination: Coordination normal.     Gait: Gait abnormal.  Psychiatric:        Mood and Affect: Mood normal.        Behavior: Behavior normal.        Thought Content: Thought content normal.        Judgment: Judgment normal.     Labs reviewed: Recent Labs    10/04/23 2049 10/05/23 0531 10/06/23 0542 01/30/24 0822 01/30/24 0843  NA  --  129* 133* 136 137  K  --  3.2* 2.9* 3.2* 3.2*  CL  --  92* 98 96* 101  CO2  --  24 25 27   --   GLUCOSE  --  112* 115* 209* 220*  BUN  --  16 17 17 18   CREATININE  --  1.08* 0.82 1.08* 1.10*  CALCIUM   --  8.1* 8.5* 9.1  --   MG  2.0 2.3  --  1.7  --   PHOS  --  2.9  --   --   --    Recent Labs    09/19/23 1051 10/04/23 1900 01/30/24 0822  AST 19 25 23   ALT 18 22 19   ALKPHOS 113 125 120  BILITOT 0.4 1.3* 0.7  PROT 7.3 7.6 7.1  ALBUMIN 4.1 3.4* 3.5   Recent Labs    09/19/23 1051 10/04/23 1900 10/05/23 0531 10/06/23 0542 01/30/24 0822 01/30/24 0843  WBC 7.9   < > 15.0* 14.5* 11.9*  --   NEUTROABS 4.7  --   --  10.7* 8.3*  --   HGB 13.8   < > 13.1 12.9 13.7 13.3  HCT 41.1   < > 38.9 38.5 40.7 39.0  MCV 88.6   < > 89.0 89.1 88.1  --   PLT 289.0   < > 242 285 305  --    < > = values in this interval not displayed.   Lab Results  Component Value Date   TSH 0.61 08/30/2022   Lab Results  Component Value Date   HGBA1C 6.9 (H) 09/19/2023   Lab Results  Component Value Date   CHOL 179 09/19/2023   HDL 76.10 09/19/2023   LDLCALC 81 09/19/2023   LDLDIRECT 69.0 01/28/2018   TRIG 114.0 09/19/2023   CHOLHDL 2 09/19/2023    Significant Diagnostic Results in last 30 days:  CT ANGIO HEAD NECK W WO CM Result Date: 01/30/2024 EXAM: CTA HEAD AND NECK WITH CONTRAST 01/30/2024 09:43:22 AM TECHNIQUE: CTA of the head and neck was performed with and without the administration  of 75 mL of intravenous iohexol  (OMNIPAQUE ) 350 MG/ML injection. Multiplanar 2D and/or 3D reformatted images are provided for review. Automated exposure control, iterative reconstruction, and/or weight based adjustment of the mA/kV was utilized to reduce the radiation dose to as low as reasonably achievable. COMPARISON: MRA head 11/19/2011 CLINICAL HISTORY: left sided weakness LKN 2d ago FINDINGS: CTA NECK: AORTIC ARCH AND ARCH VESSELS: No dissection or arterial injury. There is approximately 50-60% stenosis of the proximal left subclavian artery in the setting of calcified and noncalcified atheromatous plaque. No significant stenosis of the brachiocephalic artery. CERVICAL CAROTID ARTERIES: No dissection, arterial injury, or hemodynamically  significant stenosis by NASCET criteria. CERVICAL VERTEBRAL ARTERIES: No dissection, arterial injury, or significant stenosis. LUNGS AND MEDIASTINUM: Unremarkable. SOFT TISSUES: Heterogeneous, enlarged, multinodular thyroid  gland. BONES: No acute abnormality. CTA HEAD: ANTERIOR CIRCULATION: No significant stenosis of the internal carotid arteries. No significant stenosis of the anterior cerebral arteries. No significant stenosis of the middle cerebral arteries. No aneurysm. POSTERIOR CIRCULATION: No significant stenosis of the posterior cerebral arteries. No significant stenosis of the basilar artery. No significant stenosis of the vertebral arteries. No aneurysm. OTHER: No dural venous sinus thrombosis on this non-dedicated study. ORBITS: Bilateral lens replacement. SINUSES AND MASTOIDS: No acute abnormality. IMPRESSION: 1. Approximately 50-60% stenosis of the proximal left subclavian artery in the setting of calcified and noncalcified atheromatous plaque. 2. No large vessel occlusion or other significant stenosis of the major head and neck arteries. 3. Heterogeneous, multinodular enlarged thyroid  gland. Correlate with nonemergent sonography if not previously performed. Electronically signed by: prentice bybordi 01/30/2024 11:25 AM EST RP Workstation: [id number]   VAS US  LOWER EXTREMITY VENOUS (DVT) (ONLY [facility] & [facility]) Result Date: 01/30/2024  Lower Venous DVT Study Patient Name:  [name]  Date of Exam:   01/30/2024 Medical Rec #: [id number]    Accession #:    [id number] Date of Birth: Dec 24, 1922   Patient Gender: F Patient Age:   35 years Exam Location:  [facility] Procedure:      VAS US  LOWER EXTREMITY VENOUS (DVT) Referring Phys: JERILYNN LATER --------------------------------------------------------------------------------  Indications: Swelling, SOB, and Left sided weakness.  Comparison Study: No priors. Performing Technologist: Ricka Sturdivant-Jones RDMS, RVT  Examination Guidelines: A complete  evaluation includes B-mode imaging, spectral Doppler, color Doppler, and power Doppler as needed of all accessible portions of each vessel. Bilateral testing is considered an integral part of a complete examination. Limited examinations for reoccurring indications may be performed as noted. The reflux portion of the exam is performed with the patient in reverse Trendelenburg.  +---------+---------------+---------+-----------+----------+--------------+ RIGHT    CompressibilityPhasicitySpontaneityPropertiesThrombus Aging +---------+---------------+---------+-----------+----------+--------------+ CFV      Full           Yes      Yes                                 +---------+---------------+---------+-----------+----------+--------------+ SFJ      Full                                                        +---------+---------------+---------+-----------+----------+--------------+ FV Prox  Full                                                        +---------+---------------+---------+-----------+----------+--------------+  FV Mid   Full           Yes      Yes                                 +---------+---------------+---------+-----------+----------+--------------+ FV DistalFull                                                        +---------+---------------+---------+-----------+----------+--------------+ PFV      Full                                                        +---------+---------------+---------+-----------+----------+--------------+ POP      Full           Yes      Yes                                 +---------+---------------+---------+-----------+----------+--------------+ PTV      Full                                                        +---------+---------------+---------+-----------+----------+--------------+ PERO     Full                                                         +---------+---------------+---------+-----------+----------+--------------+   +---------+---------------+---------+-----------+----------+--------------+ LEFT     CompressibilityPhasicitySpontaneityPropertiesThrombus Aging +---------+---------------+---------+-----------+----------+--------------+ CFV      Full           Yes      Yes                                 +---------+---------------+---------+-----------+----------+--------------+ SFJ      Full                                                        +---------+---------------+---------+-----------+----------+--------------+ FV Prox  Full                                                        +---------+---------------+---------+-----------+----------+--------------+ FV Mid   Full           Yes      Yes                                 +---------+---------------+---------+-----------+----------+--------------+  FV DistalFull                                                        +---------+---------------+---------+-----------+----------+--------------+ PFV      Full                                                        +---------+---------------+---------+-----------+----------+--------------+ POP      Full           Yes      Yes                                 +---------+---------------+---------+-----------+----------+--------------+ PTV      Full                                                        +---------+---------------+---------+-----------+----------+--------------+ PERO     Full                                                        +---------+---------------+---------+-----------+----------+--------------+     Summary: RIGHT: - There is no evidence of deep vein thrombosis in the lower extremity.  - A cystic structure is found in the popliteal fossa. - Measuring 6.0 x 2.4 cm  LEFT: - There is no evidence of deep vein thrombosis in the lower extremity.  - A cystic structure is  found in the popliteal fossa. - measuring 4.4 x 1.3 cm  *See table(s) above for measurements and observations. Electronically signed by Lonni Gaskins MD on 01/30/2024 at 10:36:43 AM.    Final     Assessment/Plan: Hypokalemia K 3.2 in ED Repeat CMP/eGFR  Edema Edema BLE, negative DVT venous ultrasound, taking diuretics-hydrochloric, BNP 222 01/30/24  Essential hypertension Permissive blood pressures control,  continue diltiazem   GERD stable, Hgb 13.3 01/30/24, taking pantoprazole    Hyperlipidemia associated with type 2 diabetes mellitus (HCC) LDL 82 09/19/23 taking atorvastatin  Hgb A1c 6.9 09/19/23, Vit B12 658 09/19/23, TSH 0.61 08/30/23 Diet controlled Update CBC/differential, HgbA1c, TSH  CKD (chronic kidney disease), stage III (HCC) Bun/creat 18/1.10 01/30/24    Family/ staff Communication: plan of care reviewed with the patient and charge nurse.   Labs/tests ordered: CBC/differential, CMP/eGFR, Hgb A1c, TSH

## 2024-02-16 NOTE — Assessment & Plan Note (Signed)
 Bun/creat 18/1.10 01/30/24

## 2024-02-16 NOTE — Assessment & Plan Note (Signed)
 Edema BLE, negative DVT venous ultrasound, taking diuretics-hydrochloric, BNP 222 01/30/24

## 2024-02-16 NOTE — Assessment & Plan Note (Addendum)
 LDL 82 09/19/23 taking atorvastatin  Hgb A1c 6.9 09/19/23, Vit B12 658 09/19/23, TSH 0.61 08/30/23 Diet controlled Update CBC/differential, HgbA1c, TSH

## 2024-02-16 NOTE — Assessment & Plan Note (Signed)
 stable, Hgb 13.3 01/30/24, taking pantoprazole 

## 2024-02-18 ENCOUNTER — Non-Acute Institutional Stay (SKILLED_NURSING_FACILITY): Payer: Self-pay | Admitting: Family Medicine

## 2024-02-18 DIAGNOSIS — E1122 Type 2 diabetes mellitus with diabetic chronic kidney disease: Secondary | ICD-10-CM

## 2024-02-18 DIAGNOSIS — I10 Essential (primary) hypertension: Secondary | ICD-10-CM | POA: Diagnosis not present

## 2024-02-18 DIAGNOSIS — N1831 Chronic kidney disease, stage 3a: Secondary | ICD-10-CM

## 2024-02-18 NOTE — Progress Notes (Signed)
 Provider:  Garnette Pinal, MD Location:      Place of Service:     PCP: Katrinka Garnette KIDD, MD Patient Care Team: Katrinka Garnette KIDD, MD as PCP - General (Family Medicine) Rosan Credit, MD as Consulting Physician (Ophthalmology) [facility], [facility]  Extended Emergency Contact Information Primary Emergency Contact: Edwena Mom  [place] Mobile Phone: [phone] Relation: Niece Secondary Emergency Contact: Atkinson,Judy Mobile Phone: [phone] Relation: Friend  Code Status:  Goals of Care: Advanced Directive information    01/30/2024    8:17 AM  Advanced Directives  Does Patient Have a Medical Advance Directive? No    HPI: Patient is a 89 y.o. female seen today for admission to [facility] skilled nursing facility.  [facility] but experienced an episode of weakness and ended up in the emergency room where she was evaluated for CVA.  Tests to show CVA were all negative but her potassium was slightly low at 3.2 (normal 3.5-5.5).  She was given oral potassium and discharged back to the facility.  It was suggested she come to skilled care.  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  Her plan is to move to assisted living eventually.  Basically she has enjoyed good health.  She is 89 years old.  She formally played golf regularly.  She is followed by PCP in the community who treats her for hypertension, hyperlipidemia, type 2 diabetes, chronic kidney disease.  She had formally ambulated with a walker but now spends most of the time in a motorized scooter with minimal walking except in her room.  Past Medical History:  Diagnosis Date   Adenomatous polyps    Arthritis    Cerebrovascular accident [facility])    Cervical cancer (HCC)    Cervical Ca-no radiation or chemo   Diverticulosis    Edema    Effusion of ankle and foot joint    GERD  (gastroesophageal reflux disease)    History of UTI    Hypertension    Macular degeneration    Observation for suspected cardiovascular disease    Other abnormal blood chemistry    Peptic ulcer disease    Pyloric stenosis    S/P total abdominal hysterectomy    Special screening for malignant neoplasm of colon    Past Surgical History:  Procedure Laterality Date   ABDOMINAL HYSTERECTOMY     including cervix   CATARACT EXTRACTION  2008   Dr Rosan   cyst on right hand removed     TEE WITHOUT CARDIOVERSION  11/20/2011   TEE with bubble study-PFO    reports that she quit smoking about 75 years ago. Her smoking use included cigarettes. She has never used smokeless tobacco. She reports current alcohol use. She reports that she does not use drugs. Social History   Socioeconomic History   Marital status: Widowed    Spouse name: Not on file   Number of children: 0   Years of education: College   Highest education level: Not on file  Occupational History   Occupation: Retired    Associate Professor: RETIRED  Tobacco Use   Smoking status: Former    Current packs/day: 0.00    Types: Cigarettes    Quit date: 03/11/1948    Years since quitting: 75.9   Smokeless tobacco: Never  Vaping Use   Vaping status: Never Used  Substance and Sexual Activity   Alcohol use: Yes  Alcohol/week: 0.0 standard drinks of alcohol    Comment: Occasional wine, not very often   Drug use: No   Sexual activity: Not Currently  Other Topics Concern   Not on file  Social History Narrative   HSG, [employer]. Married '50- widowed after 57 years.   [facility]- 2 rooms (living room and bedroom and 2 balconies)      On the list [facility]   ACP/End of life: No CPR, no heroic/futile treatment, i.e. prolonged ICU care. HCPA sister lebron Borer tele [phone].      Patient is retired from [employer] after 35 years   Right handed.   Caffeine two cups of coffee daily. Very Rare coke.       Hobbies: play golf, play bridge   Social Drivers of Corporate Investment Banker Strain: Low Risk  (07/22/2023)   Overall Financial Resource Strain (CARDIA)    Difficulty of Paying Living Expenses: Not hard at all  Food Insecurity: No Food Insecurity (10/08/2023)   Hunger Vital Sign    Worried About Running Out of Food in the Last Year: Never true    Ran Out of Food in the Last Year: Never true  Transportation Needs: No Transportation Needs (10/08/2023)   PRAPARE - Administrator, Civil Service (Medical): No    Lack of Transportation (Non-Medical): No  Physical Activity: Inactive (07/22/2023)   Exercise Vital Sign    Days of Exercise per Week: 0 days    Minutes of Exercise per Session: 0 min  Stress: No Stress Concern Present (07/22/2023)   [employer] of Occupational Health - Occupational Stress Questionnaire    Feeling of Stress : Not at all  Social Connections: Moderately Integrated (10/05/2023)   Social Connection and Isolation Panel    Frequency of Communication with Friends and Family: More than three times a week    Frequency of Social Gatherings with Friends and Family: Twice a week    Attends Religious Services: More than 4 times per year    Active Member of [employer] or Organizations: Yes    Attends Banker Meetings: 1 to 4 times per year    Marital Status: Widowed  Intimate Partner Violence: Not At Risk (10/08/2023)   Humiliation, Afraid, Rape, and Kick questionnaire    Fear of Current or Ex-Partner: No    Emotionally Abused: No    Physically Abused: No    Sexually Abused: No    Functional Status Survey:    Family History  Problem Relation Age of Onset   Heart failure Mother    Hypertension Mother    Coronary artery disease Mother    Hypertension Sister    Stroke Brother    Hypertension Maternal Grandmother    Stroke Maternal Grandmother    Hypertension Maternal Grandfather     Health Maintenance  Topic Date Due   Zoster Vaccines-  Shingrix (1 of 2) Never done   OPHTHALMOLOGY EXAM  09/25/2023   Influenza Vaccine  10/10/2023   COVID-19 Vaccine (6 - 2025-26 season) 11/10/2023   Bone Density Scan  06/27/2024 (Originally 02/25/1988)   Pneumococcal Vaccine: 50+ Years (1 of 2 - PCV) 09/18/2024 (Originally 02/26/1942)   HEMOGLOBIN A1C  03/21/2024   Medicare Annual Wellness (AWV)  07/21/2024   FOOT EXAM  09/18/2024   Meningococcal B Vaccine  Aged Out   DTaP/Tdap/Td  Discontinued    No Known Allergies  Outpatient Encounter Medications as of 02/18/2024  Medication Sig  atorvastatin  (LIPITOR) 20 MG tablet Take 1 tablet (20 mg total) by mouth once a week.   Bisacodyl  (DULCOLAX PO) Take 1 tablet by mouth as needed.   clopidogrel  (PLAVIX ) 75 MG tablet Take 1 tablet (75 mg total) by mouth daily. TAKE ONE TABLET BY MOUTH DAILY   diltiazem  (CARDIZEM  CD) 240 MG 24 hr capsule Take 1 capsule (240 mg total) by mouth daily.   hydrochlorothiazide  (HYDRODIURIL ) 25 MG tablet Take 1 tablet (25 mg total) by mouth daily.   Multiple Vitamins-Minerals (CENTRUM SILVER ) tablet Take 1 tablet by mouth daily.   naproxen sodium (ALEVE) 220 MG tablet Take 220 mg by mouth daily.   pantoprazole  (PROTONIX ) 20 MG tablet Take 1 tablet (20 mg total) by mouth daily.   potassium chloride  SA (KLOR-CON  M) 20 MEQ tablet Take 1 tablet (20 mEq total) by mouth 2 (two) times daily for 7 days.   No facility-administered encounter medications on file as of 02/18/2024.    Review of Systems  Constitutional: Negative.   HENT: Negative.    Respiratory: Negative.    Cardiovascular:  Positive for leg swelling.  Gastrointestinal: Negative.   Genitourinary: Negative.   Musculoskeletal:  Positive for arthralgias and gait problem.  Skin: Negative.   Psychiatric/Behavioral: Negative.      There were no vitals filed for this visit. There is no height or weight on file to calculate BMI. Physical Exam Vitals and nursing note reviewed.  Constitutional:       Appearance: Normal appearance.  HENT:     Head: Normocephalic.     Mouth/Throat:     Mouth: Mucous membranes are moist.     Pharynx: Oropharynx is clear.  Eyes:     Pupils: Pupils are equal, round, and reactive to light.  Cardiovascular:     Rate and Rhythm: Normal rate and regular rhythm.     Heart sounds: Normal heart sounds.  Pulmonary:     Effort: Pulmonary effort is normal.     Breath sounds: Normal breath sounds.  Abdominal:     General: Bowel sounds are normal.     Palpations: Abdomen is soft.  Musculoskeletal:        General: Swelling present.  Skin:    General: Skin is warm.     Findings: Bruising present.  Neurological:     General: No focal deficit present.     Mental Status: She is alert and oriented to person, place, and time.  Psychiatric:        Mood and Affect: Mood normal.        Behavior: Behavior normal.     Labs reviewed: Basic Metabolic Panel: Recent Labs    10/04/23 2049 10/05/23 0531 10/06/23 0542 01/30/24 0822 01/30/24 0843  NA  --  129* 133* 136 137  K  --  3.2* 2.9* 3.2* 3.2*  CL  --  92* 98 96* 101  CO2  --  24 25 27   --   GLUCOSE  --  112* 115* 209* 220*  BUN  --  16 17 17 18   CREATININE  --  1.08* 0.82 1.08* 1.10*  CALCIUM   --  8.1* 8.5* 9.1  --   MG 2.0 2.3  --  1.7  --   PHOS  --  2.9  --   --   --    Liver Function Tests: Recent Labs    09/19/23 1051 10/04/23 1900 01/30/24 0822  AST 19 25 23   ALT 18 22 19   ALKPHOS 113 125 120  BILITOT 0.4 1.3* 0.7  PROT 7.3 7.6 7.1  ALBUMIN 4.1 3.4* 3.5   No results for input(s): LIPASE, AMYLASE in the last 8760 hours. No results for input(s): AMMONIA in the last 8760 hours. CBC: Recent Labs    09/19/23 1051 10/04/23 1900 10/05/23 0531 10/06/23 0542 01/30/24 0822 01/30/24 0843  WBC 7.9   < > 15.0* 14.5* 11.9*  --   NEUTROABS 4.7  --   --  10.7* 8.3*  --   HGB 13.8   < > 13.1 12.9 13.7 13.3  HCT 41.1   < > 38.9 38.5 40.7 39.0  MCV 88.6   < > 89.0 89.1 88.1  --   PLT  289.0   < > 242 285 305  --    < > = values in this interval not displayed.   Cardiac Enzymes: Recent Labs    01/30/24 0822  CKTOTAL 47   BNP: Invalid input(s): POCBNP Lab Results  Component Value Date   HGBA1C 6.9 (H) 09/19/2023   Lab Results  Component Value Date   TSH 0.61 08/30/2022   Lab Results  Component Value Date   VITAMINB12 658 09/19/2023   No results found for: FOLATE No results found for: IRON, TIBC, FERRITIN  Imaging and Procedures obtained prior to SNF admission: CT ANGIO HEAD NECK W WO CM Result Date: 01/30/2024 EXAM: CTA HEAD AND NECK WITH CONTRAST 01/30/2024 09:43:22 AM TECHNIQUE: CTA of the head and neck was performed with and without the administration of 75 mL of intravenous iohexol  (OMNIPAQUE ) 350 MG/ML injection. Multiplanar 2D and/or 3D reformatted images are provided for review. Automated exposure control, iterative reconstruction, and/or weight based adjustment of the mA/kV was utilized to reduce the radiation dose to as low as reasonably achievable. COMPARISON: MRA head 11/19/2011 CLINICAL HISTORY: left sided weakness LKN 2d ago FINDINGS: CTA NECK: AORTIC ARCH AND ARCH VESSELS: No dissection or arterial injury. There is approximately 50-60% stenosis of the proximal left subclavian artery in the setting of calcified and noncalcified atheromatous plaque. No significant stenosis of the brachiocephalic artery. CERVICAL CAROTID ARTERIES: No dissection, arterial injury, or hemodynamically significant stenosis by NASCET criteria. CERVICAL VERTEBRAL ARTERIES: No dissection, arterial injury, or significant stenosis. LUNGS AND MEDIASTINUM: Unremarkable. SOFT TISSUES: Heterogeneous, enlarged, multinodular thyroid  gland. BONES: No acute abnormality. CTA HEAD: ANTERIOR CIRCULATION: No significant stenosis of the internal carotid arteries. No significant stenosis of the anterior cerebral arteries. No significant stenosis of the middle cerebral arteries. No  aneurysm. POSTERIOR CIRCULATION: No significant stenosis of the posterior cerebral arteries. No significant stenosis of the basilar artery. No significant stenosis of the vertebral arteries. No aneurysm. OTHER: No dural venous sinus thrombosis on this non-dedicated study. ORBITS: Bilateral lens replacement. SINUSES AND MASTOIDS: No acute abnormality. IMPRESSION: 1. Approximately 50-60% stenosis of the proximal left subclavian artery in the setting of calcified and noncalcified atheromatous plaque. 2. No large vessel occlusion or other significant stenosis of the major head and neck arteries. 3. Heterogeneous, multinodular enlarged thyroid  gland. Correlate with nonemergent sonography if not previously performed. Electronically signed by: prentice bybordi 01/30/2024 11:25 AM EST RP Workstation: [id number]   VAS US  LOWER EXTREMITY VENOUS (DVT) (ONLY [facility] & [facility]) Result Date: 01/30/2024  Lower Venous DVT Study Patient Name:  [name]  Date of Exam:   01/30/2024 Medical Rec #: [id number]    Accession #:    [id number] Date of Birth: 1922-10-09   Patient Gender: F Patient Age:   29 years Exam Location:  [facility] Procedure:  VAS US  LOWER EXTREMITY VENOUS (DVT) Referring Phys: JERILYNN LATER --------------------------------------------------------------------------------  Indications: Swelling, SOB, and Left sided weakness.  Comparison Study: No priors. Performing Technologist: Ricka Sturdivant-Jones RDMS, RVT  Examination Guidelines: A complete evaluation includes B-mode imaging, spectral Doppler, color Doppler, and power Doppler as needed of all accessible portions of each vessel. Bilateral testing is considered an integral part of a complete examination. Limited examinations for reoccurring indications may be performed as noted. The reflux portion of the exam is performed with the patient in reverse Trendelenburg.  +---------+---------------+---------+-----------+----------+--------------+ RIGHT     CompressibilityPhasicitySpontaneityPropertiesThrombus Aging +---------+---------------+---------+-----------+----------+--------------+ CFV      Full           Yes      Yes                                 +---------+---------------+---------+-----------+----------+--------------+ SFJ      Full                                                        +---------+---------------+---------+-----------+----------+--------------+ FV Prox  Full                                                        +---------+---------------+---------+-----------+----------+--------------+ FV Mid   Full           Yes      Yes                                 +---------+---------------+---------+-----------+----------+--------------+ FV DistalFull                                                        +---------+---------------+---------+-----------+----------+--------------+ PFV      Full                                                        +---------+---------------+---------+-----------+----------+--------------+ POP      Full           Yes      Yes                                 +---------+---------------+---------+-----------+----------+--------------+ PTV      Full                                                        +---------+---------------+---------+-----------+----------+--------------+ PERO     Full                                                        +---------+---------------+---------+-----------+----------+--------------+   +---------+---------------+---------+-----------+----------+--------------+  LEFT     CompressibilityPhasicitySpontaneityPropertiesThrombus Aging +---------+---------------+---------+-----------+----------+--------------+ CFV      Full           Yes      Yes                                 +---------+---------------+---------+-----------+----------+--------------+ SFJ      Full                                                         +---------+---------------+---------+-----------+----------+--------------+ FV Prox  Full                                                        +---------+---------------+---------+-----------+----------+--------------+ FV Mid   Full           Yes      Yes                                 +---------+---------------+---------+-----------+----------+--------------+ FV DistalFull                                                        +---------+---------------+---------+-----------+----------+--------------+ PFV      Full                                                        +---------+---------------+---------+-----------+----------+--------------+ POP      Full           Yes      Yes                                 +---------+---------------+---------+-----------+----------+--------------+ PTV      Full                                                        +---------+---------------+---------+-----------+----------+--------------+ PERO     Full                                                        +---------+---------------+---------+-----------+----------+--------------+     Summary: RIGHT: - There is no evidence of deep vein thrombosis in the lower extremity.  - A cystic structure is found in the popliteal fossa. - Measuring 6.0 x 2.4 cm  LEFT: - There is no evidence of deep vein thrombosis in the lower extremity.  -  A cystic structure is found in the popliteal fossa. - measuring 4.4 x 1.3 cm  *See table(s) above for measurements and observations. Electronically signed by Lonni Gaskins MD on 01/30/2024 at 10:36:43 AM.    Final     Assessment/Plan Essential hypertension Blood pressures have been controlled on combination diltiazem  and hydrochlorothiazide .  Continue to monitor with no changes  Type 2 diabetes mellitus with diabetic chronic kidney disease (HCC) She is on no medication and asymptomatic.  Controlled with diet  CKD  (chronic kidney disease), stage III (HCC) Most recent creatinine 1.08 with BUN of 17    Family/ staff Communication:   Labs/tests ordered:BMP  Garnette HERO. Cleotilde, MD [facility] [address] [place], [place] [address] Office [phone]

## 2024-02-18 NOTE — Assessment & Plan Note (Signed)
 Blood pressures have been controlled on combination diltiazem  and hydrochlorothiazide .  Continue to monitor with no changes

## 2024-02-18 NOTE — Assessment & Plan Note (Signed)
 She is on no medication and asymptomatic.  Controlled with diet

## 2024-02-18 NOTE — Assessment & Plan Note (Signed)
 Most recent creatinine 1.08 with BUN of 17

## 2024-02-19 LAB — BASIC METABOLIC PANEL WITH GFR
Chloride: 97 — AB (ref 99–108)
Creatinine: 1.1 (ref 0.5–1.1)
Glucose: 153
Potassium: 3.4 meq/L — AB (ref 3.5–5.1)
Sodium: 137 (ref 137–147)

## 2024-02-19 LAB — COMPREHENSIVE METABOLIC PANEL WITH GFR
Albumin: 3.5 (ref 3.5–5.0)
Calcium: 9.1 (ref 8.7–10.7)
Globulin: 2.7
eGFR: 47

## 2024-02-19 LAB — CBC AND DIFFERENTIAL
HCT: 40 (ref 36–46)
Hemoglobin: 13.5 (ref 12.0–16.0)
WBC: 9.7

## 2024-02-19 LAB — HEPATIC FUNCTION PANEL
ALT: 18 U/L (ref 7–35)
AST: 15 (ref 13–35)

## 2024-02-19 LAB — CBC: RBC: 4.51 (ref 3.87–5.11)

## 2024-02-19 LAB — HEMOGLOBIN A1C: Hemoglobin A1C: 8.1

## 2024-03-12 ENCOUNTER — Non-Acute Institutional Stay (SKILLED_NURSING_FACILITY): Admitting: Nurse Practitioner

## 2024-03-12 ENCOUNTER — Encounter: Payer: Self-pay | Admitting: Nurse Practitioner

## 2024-03-12 DIAGNOSIS — E785 Hyperlipidemia, unspecified: Secondary | ICD-10-CM

## 2024-03-12 DIAGNOSIS — I10 Essential (primary) hypertension: Secondary | ICD-10-CM

## 2024-03-12 DIAGNOSIS — E876 Hypokalemia: Secondary | ICD-10-CM | POA: Diagnosis not present

## 2024-03-12 DIAGNOSIS — E1169 Type 2 diabetes mellitus with other specified complication: Secondary | ICD-10-CM | POA: Diagnosis not present

## 2024-03-12 DIAGNOSIS — R609 Edema, unspecified: Secondary | ICD-10-CM | POA: Diagnosis not present

## 2024-03-12 DIAGNOSIS — Z66 Do not resuscitate: Secondary | ICD-10-CM | POA: Diagnosis not present

## 2024-03-12 DIAGNOSIS — K219 Gastro-esophageal reflux disease without esophagitis: Secondary | ICD-10-CM

## 2024-03-12 NOTE — Assessment & Plan Note (Signed)
"   negative DVT venous ultrasound, taking diuretics-hydrochloric, BNP 222 01/30/24 "

## 2024-03-12 NOTE — Progress Notes (Signed)
 " Location:  [facility] Room Number: N041-A Place of Service:  SNF [phone]) Provider:  Dyonna Jaspers Lorenda Jaizon Deroos N.P.     Patient Care Team: Katrinka Garnette KIDD, MD as PCP - General (Family Medicine) Rosan Credit, MD as Consulting Physician (Ophthalmology) [place], [facility]  Extended Emergency Contact Information Primary Emergency Contact: Edwena Mom  [place] Mobile Phone: [phone] Relation: Niece Secondary Emergency Contact: Atkinson,Judy Mobile Phone: [phone] Relation: Friend  Code Status:  DNR Goals of care: Advanced Directive information    01/30/2024    8:17 AM  Advanced Directives  Does Patient Have a Medical Advance Directive? No     Chief Complaint  Patient presents with   medication management of chronic issues    Routine visit. Discuss the need for Eye exam, flu, zoster, and covid vaccines as well.     HPI:  Pt is a 89 y.o. female seen today for medical management of chronic diseases.    01/30/2024 ED eval for generalized weakness, CT angio head/neck, labs, noted hypokalemia, negative urine analysis for UTI, mild elevated BNP 222, potassium repletion             Hypokalemia, K3.2 01/30/2024, 3.4 02/19/24             Edema BLE, negative DVT venous ultrasound, taking diuretics-hydrochloric, BNP 222 01/30/24 Subclavian artery sclerosis, off atorvastatin              HTN, taking diltiazem              GERD, Hx of PUD, stable, Hgb 13.5 02/19/24,  taking pantoprazole   HLD, LDL 82 09/19/23, off atorvastatin              T2DM, Vit B12 658 09/19/23, TSH 1.07, Hgb A1c 8.1 02/19/24, diet controls.              CKD, Creat 1.1 02/19/24     Past Medical History:  Diagnosis Date   Adenomatous polyps    Arthritis    Cerebrovascular accident [facility])    Cervical cancer (HCC)    Cervical Ca-no radiation or chemo   Diverticulosis    Edema    Effusion of ankle and foot joint    GERD (gastroesophageal reflux disease)     History of UTI    Hypertension    Macular degeneration    Observation for suspected cardiovascular disease    Other abnormal blood chemistry    Peptic ulcer disease    Pyloric stenosis    S/P total abdominal hysterectomy    Special screening for malignant neoplasm of colon    Past Surgical History:  Procedure Laterality Date   ABDOMINAL HYSTERECTOMY     including cervix   CATARACT EXTRACTION  2008   Dr Rosan   cyst on right hand removed     TEE WITHOUT CARDIOVERSION  11/20/2011   TEE with bubble study-PFO    Allergies[1]  Outpatient Encounter Medications as of 03/12/2024  Medication Sig   Bisacodyl  (DULCOLAX PO) Take 1 tablet by mouth as needed.   clopidogrel  (PLAVIX ) 75 MG tablet Take 1 tablet (75 mg total) by mouth daily. TAKE ONE TABLET BY MOUTH DAILY   diltiazem  (CARDIZEM  CD) 240 MG 24 hr capsule Take 1 capsule (240 mg total) by mouth daily.   hydrochlorothiazide  (HYDRODIURIL ) 25 MG tablet Take 1 tablet (25 mg total) by mouth daily.   Multiple Vitamins-Minerals (CENTRUM SILVER ) tablet Take 1 tablet by mouth daily.   naproxen sodium (ALEVE) 220 MG tablet  Take 220 mg by mouth daily.   pantoprazole  (PROTONIX ) 20 MG tablet Take 1 tablet (20 mg total) by mouth daily.   [DISCONTINUED] atorvastatin  (LIPITOR) 20 MG tablet Take 1 tablet (20 mg total) by mouth once a week. (Patient not taking: Reported on 03/12/2024)   [DISCONTINUED] potassium chloride  SA (KLOR-CON  M) 20 MEQ tablet Take 1 tablet (20 mEq total) by mouth 2 (two) times daily for 7 days. (Patient not taking: Reported on 03/12/2024)   No facility-administered encounter medications on file as of 03/12/2024.    Review of Systems  Constitutional:  Negative for appetite change and fatigue.  HENT:  Negative for congestion and trouble swallowing.   Eyes:  Negative for visual disturbance.  Respiratory:  Negative for cough and wheezing.   Cardiovascular:  Positive for leg swelling. Negative for chest pain and palpitations.   Gastrointestinal:  Negative for abdominal pain, constipation, nausea and vomiting.  Genitourinary:  Negative for dysuria and urgency.  Musculoskeletal:  Positive for arthralgias and gait problem.  Skin:  Negative for color change.  Neurological:  Negative for syncope and headaches.       RLE weakness, muscle strength 5/5  Psychiatric/Behavioral:  Negative for confusion and sleep disturbance. The patient is not nervous/anxious.     Immunization History  Administered Date(s) Administered   Influenza-Unspecified 01/09/2018   Moderna Sars-Covid-2 Vaccination 04/12/2019, 05/10/2019, 01/18/2020, 08/08/2020   Pfizer Covid-19 Vaccine Bivalent Booster 22yrs & up 11/28/2020   Pertinent  Health Maintenance Due  Topic Date Due   OPHTHALMOLOGY EXAM  09/25/2023   Influenza Vaccine  10/10/2023   Bone Density Scan  06/27/2024 (Originally 02/25/1988)   HEMOGLOBIN A1C  08/19/2024   FOOT EXAM  09/18/2024      08/22/2021   10:58 AM 06/04/2022   12:04 PM 08/30/2022    9:43 AM 07/22/2023    2:31 PM 10/08/2023   12:28 PM  Fall Risk  Falls in the past year? 1 0 0 0 0  Was there an injury with Fall? 1  0   0    Fall Risk Category Calculator 2 0  0   Fall Risk Category (Retired) Moderate       (RETIRED) Patient Fall Risk Level Low fall risk       Patient at Risk for Falls Due to History of fall(s) Impaired vision;Impaired balance/gait  Impaired balance/gait;Impaired mobility   Fall risk Follow up Falls evaluation completed;Falls prevention discussed  Falls prevention discussed  Falls prevention discussed Falls evaluation completed;Falls prevention discussed     Data saved with a previous flowsheet row definition   Functional Status Survey:    Vitals:   03/12/24 0833 03/12/24 0843  BP: (!) 149/74 119/71  Pulse: 82   Temp: (!) 97.1 F (36.2 C)   SpO2: 96%   Weight: 171 lb 9.6 oz (77.8 kg)   Height: 5' 2 (1.575 m)    Body mass index is 31.39 kg/m. Physical Exam Vitals and nursing note  reviewed.  Constitutional:      Appearance: Normal appearance. She is well-developed.  HENT:     Head: Normocephalic and atraumatic.     Nose: Nose normal.     Mouth/Throat:     Mouth: Mucous membranes are moist.  Eyes:     Extraocular Movements: Extraocular movements intact.     Conjunctiva/sclera: Conjunctivae normal.     Pupils: Pupils are equal, round, and reactive to light.  Neck:     Trachea: No tracheal deviation.  Cardiovascular:  Rate and Rhythm: Normal rate and regular rhythm.     Heart sounds: No murmur heard. Pulmonary:     Effort: Pulmonary effort is normal. No respiratory distress.     Breath sounds: No wheezing, rhonchi or rales.  Abdominal:     General: Bowel sounds are normal.     Palpations: Abdomen is soft.     Tenderness: There is no abdominal tenderness.  Musculoskeletal:        General: No tenderness. Normal range of motion.     Cervical back: Normal range of motion and neck supple.     Right lower leg: Edema present.     Left lower leg: Edema present.     Comments: Trace edema BLE  Skin:    General: Skin is warm and dry.     Findings: No rash.     Comments: Very faint hyperpigmentation on L and R medial ankles. Significantly improved from last visit. Minimal dry skin on the L.  Neurological:     General: No focal deficit present.     Mental Status: She is alert and oriented to person, place, and time. Mental status is at baseline.     Motor: No weakness.     Coordination: Coordination normal.     Gait: Gait abnormal.  Psychiatric:        Mood and Affect: Mood normal.        Behavior: Behavior normal.        Thought Content: Thought content normal.        Judgment: Judgment normal.     Labs reviewed: Recent Labs    10/04/23 2049 10/05/23 0531 10/06/23 0542 01/30/24 0822 01/30/24 0843 02/19/24 0000  NA  --  129* 133* 136 137 137  K  --  3.2* 2.9* 3.2* 3.2* 3.4*  CL  --  92* 98 96* 101 97*  CO2  --  24 25 27   --   --   GLUCOSE  --   112* 115* 209* 220*  --   BUN  --  16 17 17 18   --   CREATININE  --  1.08* 0.82 1.08* 1.10* 1.1  CALCIUM   --  8.1* 8.5* 9.1  --  9.1  MG 2.0 2.3  --  1.7  --   --   PHOS  --  2.9  --   --   --   --    Recent Labs    09/19/23 1051 10/04/23 1900 01/30/24 0822 02/19/24 0000  AST 19 25 23 15   ALT 18 22 19 18   ALKPHOS 113 125 120  --   BILITOT 0.4 1.3* 0.7  --   PROT 7.3 7.6 7.1  --   ALBUMIN 4.1 3.4* 3.5 3.5   Recent Labs    09/19/23 1051 10/04/23 1900 10/05/23 0531 10/06/23 0542 01/30/24 0822 01/30/24 0843 02/19/24 0000  WBC 7.9   < > 15.0* 14.5* 11.9*  --  9.7  NEUTROABS 4.7  --   --  10.7* 8.3*  --   --   HGB 13.8   < > 13.1 12.9 13.7 13.3 13.5  HCT 41.1   < > 38.9 38.5 40.7 39.0 40  MCV 88.6   < > 89.0 89.1 88.1  --   --   PLT 289.0   < > 242 285 305  --   --    < > = values in this interval not displayed.   Lab Results  Component Value Date   TSH  0.61 08/30/2022   Lab Results  Component Value Date   HGBA1C 8.1 02/19/2024   Lab Results  Component Value Date   CHOL 179 09/19/2023   HDL 76.10 09/19/2023   LDLCALC 81 09/19/2023   LDLDIRECT 69.0 01/28/2018   TRIG 114.0 09/19/2023   CHOLHDL 2 09/19/2023    Significant Diagnostic Results in last 30 days:  No results found.  Assessment/Plan Hypokalemia K3.2 01/30/2024, 3.4 02/19/24  Edema  negative DVT venous ultrasound, taking diuretics-hydrochloric, BNP 222 01/30/24  Essential hypertension Blood pressure is controlled, taking diltiazem   GERD  stable, Hgb 13.5 02/19/24,  taking pantoprazole    Hyperlipidemia associated with type 2 diabetes mellitus (HCC) LDL 82 09/19/23, off atorvastatin  Vit B12 658 09/19/23, TSH 1.07, Hgb A1c 8.1 02/19/24, diet controls.  Creat 1.1 02/19/24      Family/ staff Communication: plan of care reviewed with the patient and charge nurse.   Labs/tests ordered:  none        [1] No Known Allergies  "

## 2024-03-12 NOTE — Assessment & Plan Note (Signed)
 Blood pressure is controlled, taking diltiazem 

## 2024-03-12 NOTE — Assessment & Plan Note (Signed)
"   stable, Hgb 13.5 02/19/24,  taking pantoprazole   "

## 2024-03-12 NOTE — Assessment & Plan Note (Signed)
 K3.2 01/30/2024, 3.4 02/19/24

## 2024-03-12 NOTE — Assessment & Plan Note (Signed)
 LDL 82 09/19/23, off atorvastatin  Vit B12 658 09/19/23, TSH 1.07, Hgb A1c 8.1 02/19/24, diet controls.  Creat 1.1 02/19/24

## 2024-03-15 ENCOUNTER — Encounter: Payer: Self-pay | Admitting: Nurse Practitioner

## 2024-03-26 ENCOUNTER — Ambulatory Visit: Admitting: Family Medicine

## 2024-04-16 ENCOUNTER — Non-Acute Institutional Stay: Payer: Self-pay | Admitting: Nurse Practitioner

## 2024-04-16 ENCOUNTER — Encounter: Payer: Self-pay | Admitting: Nurse Practitioner

## 2024-04-16 DIAGNOSIS — K219 Gastro-esophageal reflux disease without esophagitis: Secondary | ICD-10-CM

## 2024-04-16 DIAGNOSIS — E1169 Type 2 diabetes mellitus with other specified complication: Secondary | ICD-10-CM

## 2024-04-16 DIAGNOSIS — N1831 Chronic kidney disease, stage 3a: Secondary | ICD-10-CM

## 2024-04-16 DIAGNOSIS — I10 Essential (primary) hypertension: Secondary | ICD-10-CM

## 2024-04-16 NOTE — Assessment & Plan Note (Signed)
 Hx of PUD, stable, Hgb 13.5 02/19/24,  taking pantoprazole 

## 2024-04-16 NOTE — Assessment & Plan Note (Signed)
 Hypokalemia, K3.2 01/30/2024, 3.4 02/19/24             Edema BLE, negative DVT venous ultrasound, taking diuretics-hydrochloric, BNP 222 01/30/24

## 2024-04-16 NOTE — Assessment & Plan Note (Signed)
 Creat 1.1 02/19/24

## 2024-04-16 NOTE — Progress Notes (Unsigned)
 " Location:   [facility] Room Number: [id number] Place of Service:  SNF (31) Provider: Larwance Be[place] NP  Katrinka Garnette KIDD, MD  Patient Care Team: Katrinka Garnette KIDD, MD as PCP - General (Family Medicine) Rosan Credit, MD as Consulting Physician (Ophthalmology) [place], [facility]  Extended Emergency Contact Information Primary Emergency Contact: Edwena Mom  [place] Mobile Phone: [phone] Relation: Niece Secondary Emergency Contact: Valma Hoover Mobile Phone: [phone] Relation: Friend  Code Status:  DNR Goals of care: Advanced Directive information    01/30/2024    8:17 AM  Advanced Directives  Does Patient Have a Medical Advance Directive? No     Chief Complaint  Patient presents with   Medical Management of Chronic Issues    HPI:  Pt is a 89 y.o. female seen today for medical management of chronic diseases.     01/30/2024 ED eval for generalized weakness, CT angio head/neck, labs, noted hypokalemia, negative urine analysis for UTI, mild elevated BNP 222, potassium repletion             Hypokalemia, K3.2 01/30/2024, 3.4 02/19/24             Edema BLE, negative DVT venous ultrasound, taking diuretics-hydrochloric, BNP 222 01/30/24 Subclavian artery sclerosis, off atorvastatin              HTN, taking diltiazem              GERD, Hx of PUD, stable, Hgb 13.5 02/19/24,  taking pantoprazole   HLD, LDL 82 09/19/23, off atorvastatin              T2DM, Vit B12 658 09/19/23, TSH 1.07, Hgb A1c 8.1 02/19/24, diet controls.              CKD, Creat 1.1 02/19/24    Past Medical History:  Diagnosis Date   Adenomatous polyps    Arthritis    Cerebrovascular accident [facility])    Cervical cancer (HCC)    Cervical Ca-no radiation or chemo   Diverticulosis    Edema    Effusion of ankle and foot joint    GERD (gastroesophageal reflux disease)    History of UTI    Hypertension    Macular degeneration    Observation for  suspected cardiovascular disease    Other abnormal blood chemistry    Peptic ulcer disease    Pyloric stenosis    S/P total abdominal hysterectomy    Special screening for malignant neoplasm of colon    Past Surgical History:  Procedure Laterality Date   ABDOMINAL HYSTERECTOMY     including cervix   CATARACT EXTRACTION  2008   Dr Rosan   cyst on right hand removed     TEE WITHOUT CARDIOVERSION  11/20/2011   TEE with bubble study-PFO    Allergies[1]  Allergies as of 04/16/2024   No Known Allergies      Medication List        Accurate as of April 16, 2024  2:43 PM. If you have any questions, ask your nurse or doctor.          Centrum Silver  tablet Take 1 tablet by mouth daily.   clopidogrel  75 MG tablet Commonly known as: PLAVIX  Take 1 tablet (75 mg total) by mouth daily. TAKE ONE TABLET BY MOUTH DAILY   diltiazem  240 MG 24 hr capsule Commonly known as: CARDIZEM  CD Take 1 capsule (240 mg total) by mouth daily.   DULCOLAX PO Take 1 tablet by mouth as  needed.   hydrochlorothiazide  25 MG tablet Commonly known as: HYDRODIURIL  Take 1 tablet (25 mg total) by mouth daily.   naproxen sodium 220 MG tablet Commonly known as: ALEVE Take 220 mg by mouth daily.   pantoprazole  20 MG tablet Commonly known as: PROTONIX  Take 1 tablet (20 mg total) by mouth daily.        Review of Systems  Constitutional:  Negative for appetite change and fatigue.  HENT:  Negative for congestion and trouble swallowing.   Eyes:  Negative for visual disturbance.  Respiratory:  Negative for cough and wheezing.   Cardiovascular:  Positive for leg swelling. Negative for chest pain and palpitations.  Gastrointestinal:  Negative for abdominal pain, constipation, nausea and vomiting.  Genitourinary:  Negative for dysuria and urgency.  Musculoskeletal:  Positive for arthralgias and gait problem.  Skin:  Negative for color change.  Neurological:  Negative for syncope and  headaches.       RLE weakness, muscle strength 5/5  Psychiatric/Behavioral:  Negative for confusion and sleep disturbance. The patient is not nervous/anxious.     Immunization History  Administered Date(s) Administered   Influenza-Unspecified 01/09/2018   Moderna Sars-Covid-2 Vaccination 04/12/2019, 05/10/2019, 01/18/2020, 08/08/2020   Pfizer Covid-19 Vaccine Bivalent Booster 49yrs & up 11/28/2020   Pertinent  Health Maintenance Due  Topic Date Due   OPHTHALMOLOGY EXAM  09/25/2023   Influenza Vaccine  10/10/2023   Bone Density Scan  06/27/2024 (Originally 02/25/1988)   HEMOGLOBIN A1C  08/19/2024   FOOT EXAM  09/18/2024      08/22/2021   10:58 AM 06/04/2022   12:04 PM 08/30/2022    9:43 AM 07/22/2023    2:31 PM 10/08/2023   12:28 PM  Fall Risk  Falls in the past year? 1 0 0 0 0  Was there an injury with Fall? 1  0   0    Fall Risk Category Calculator 2 0  0   Fall Risk Category (Retired) Moderate       (RETIRED) Patient Fall Risk Level Low fall risk       Patient at Risk for Falls Due to History of fall(s) Impaired vision;Impaired balance/gait  Impaired balance/gait;Impaired mobility   Fall risk Follow up Falls evaluation completed;Falls prevention discussed  Falls prevention discussed  Falls prevention discussed Falls evaluation completed;Falls prevention discussed     Data saved with a previous flowsheet row definition   Functional Status Survey:    Vitals:   04/16/24 1441  BP: 138/76  Pulse: 70  Resp: 18  Temp: (!) 97.1 F (36.2 C)  SpO2: 100%  Weight: 167 lb 11.2 oz (76.1 kg)   Body mass index is 30.67 kg/m. Physical Exam Vitals and nursing note reviewed.  Constitutional:      Appearance: Normal appearance. She is well-developed.  HENT:     Head: Normocephalic and atraumatic.     Nose: Nose normal.     Mouth/Throat:     Mouth: Mucous membranes are moist.  Eyes:     Extraocular Movements: Extraocular movements intact.     Conjunctiva/sclera:  Conjunctivae normal.     Pupils: Pupils are equal, round, and reactive to light.  Neck:     Trachea: No tracheal deviation.  Cardiovascular:     Rate and Rhythm: Normal rate and regular rhythm.     Heart sounds: No murmur heard. Pulmonary:     Effort: Pulmonary effort is normal. No respiratory distress.     Breath sounds: No wheezing, rhonchi or rales.  Abdominal:     General: Bowel sounds are normal.     Palpations: Abdomen is soft.     Tenderness: There is no abdominal tenderness.  Musculoskeletal:        General: No tenderness. Normal range of motion.     Cervical back: Normal range of motion and neck supple.     Right lower leg: Edema present.     Left lower leg: Edema present.     Comments: Trace edema BLE  Skin:    General: Skin is warm and dry.     Findings: No rash.     Comments: Very faint hyperpigmentation on L and R medial ankles. Significantly improved from last visit. Minimal dry skin on the L.  Neurological:     General: No focal deficit present.     Mental Status: She is alert and oriented to person, place, and time. Mental status is at baseline.     Motor: No weakness.     Coordination: Coordination normal.     Gait: Gait abnormal.  Psychiatric:        Mood and Affect: Mood normal.        Behavior: Behavior normal.        Thought Content: Thought content normal.        Judgment: Judgment normal.     Labs reviewed: Recent Labs    10/04/23 2049 10/05/23 0531 10/06/23 0542 01/30/24 0822 01/30/24 0843 02/19/24 0000  NA  --  129* 133* 136 137 137  K  --  3.2* 2.9* 3.2* 3.2* 3.4*  CL  --  92* 98 96* 101 97*  CO2  --  24 25 27   --   --   GLUCOSE  --  112* 115* 209* 220*  --   BUN  --  16 17 17 18   --   CREATININE  --  1.08* 0.82 1.08* 1.10* 1.1  CALCIUM   --  8.1* 8.5* 9.1  --  9.1  MG 2.0 2.3  --  1.7  --   --   PHOS  --  2.9  --   --   --   --    Recent Labs    09/19/23 1051 10/04/23 1900 01/30/24 0822 02/19/24 0000  AST 19 25 23 15   ALT 18  22 19 18   ALKPHOS 113 125 120  --   BILITOT 0.4 1.3* 0.7  --   PROT 7.3 7.6 7.1  --   ALBUMIN 4.1 3.4* 3.5 3.5   Recent Labs    09/19/23 1051 10/04/23 1900 10/05/23 0531 10/06/23 0542 01/30/24 0822 01/30/24 0843 02/19/24 0000  WBC 7.9   < > 15.0* 14.5* 11.9*  --  9.7  NEUTROABS 4.7  --   --  10.7* 8.3*  --   --   HGB 13.8   < > 13.1 12.9 13.7 13.3 13.5  HCT 41.1   < > 38.9 38.5 40.7 39.0 40  MCV 88.6   < > 89.0 89.1 88.1  --   --   PLT 289.0   < > 242 285 305  --   --    < > = values in this interval not displayed.   Lab Results  Component Value Date   TSH 0.61 08/30/2022   Lab Results  Component Value Date   HGBA1C 8.1 02/19/2024   Lab Results  Component Value Date   CHOL 179 09/19/2023   HDL 76.10 09/19/2023   LDLCALC 81 09/19/2023   LDLDIRECT 69.0  01/28/2018   TRIG 114.0 09/19/2023   CHOLHDL 2 09/19/2023    Significant Diagnostic Results in last 30 days:  No results found.  Assessment/Plan  No problem-specific Assessment & Plan notes found for this encounter.   Family/ staff Communication: Plan of care reviewed with the patient and charge nurse  Labs/tests ordered: None        [1] No Known Allergies "

## 2024-04-16 NOTE — Assessment & Plan Note (Signed)
 Blood pressure is controlled taking diltiazem 

## 2024-04-16 NOTE — Assessment & Plan Note (Signed)
 LDL 82 09/19/23, off atorvastatin  Vit B12 658 09/19/23, TSH 1.07, Hgb A1c 8.1 02/19/24, diet controls.

## 2024-07-26 ENCOUNTER — Ambulatory Visit
